# Patient Record
Sex: Female | Born: 1965
Health system: Southern US, Community
[De-identification: ages and names within clinical notes are randomized; demographics above are authoritative.]

## PROBLEM LIST (undated history)

## (undated) DIAGNOSIS — E538 Deficiency of other specified B group vitamins: Secondary | ICD-10-CM

## (undated) DIAGNOSIS — E039 Hypothyroidism, unspecified: Secondary | ICD-10-CM

## (undated) DIAGNOSIS — E119 Type 2 diabetes mellitus without complications: Secondary | ICD-10-CM

## (undated) DIAGNOSIS — E785 Hyperlipidemia, unspecified: Secondary | ICD-10-CM

## (undated) DIAGNOSIS — F419 Anxiety disorder, unspecified: Secondary | ICD-10-CM

## (undated) DIAGNOSIS — D649 Anemia, unspecified: Secondary | ICD-10-CM

## (undated) DIAGNOSIS — D510 Vitamin B12 deficiency anemia due to intrinsic factor deficiency: Principal | ICD-10-CM

## (undated) DIAGNOSIS — I10 Essential (primary) hypertension: Secondary | ICD-10-CM

## (undated) DIAGNOSIS — Z9289 Personal history of other medical treatment: Secondary | ICD-10-CM

## (undated) DIAGNOSIS — Z86010 Personal history of colonic polyps: Principal | ICD-10-CM

## (undated) DIAGNOSIS — D3910 Neoplasm of uncertain behavior of unspecified ovary: Principal | ICD-10-CM

## (undated) HISTORY — DX: Type 2 diabetes mellitus without complications: E11.9

## (undated) HISTORY — DX: Hyperlipidemia, unspecified: E78.5

## (undated) HISTORY — DX: Hypothyroidism, unspecified: E03.9

## (undated) HISTORY — DX: Personal history of other medical treatment: Z92.89

## (undated) HISTORY — DX: Vitamin B12 deficiency anemia due to intrinsic factor deficiency: D51.0

## (undated) HISTORY — DX: Anxiety disorder, unspecified: F41.9

## (undated) HISTORY — DX: Deficiency of other specified B group vitamins: E53.8

## (undated) HISTORY — DX: Personal history of colonic polyps: Z86.010

## (undated) HISTORY — DX: Neoplasm of uncertain behavior of unspecified ovary: D39.10

## (undated) HISTORY — DX: Anemia, unspecified: D64.9

## (undated) HISTORY — DX: Essential (primary) hypertension: I10

---

## 1999-08-14 DIAGNOSIS — Z9289 Personal history of other medical treatment: Secondary | ICD-10-CM

## 1999-08-14 HISTORY — DX: Personal history of other medical treatment: Z92.89

## 1999-12-25 ENCOUNTER — Encounter: Payer: Self-pay | Admitting: Emergency Medicine

## 1999-12-25 ENCOUNTER — Inpatient Hospital Stay (HOSPITAL_COMMUNITY): Admission: EM | Admit: 1999-12-25 | Discharge: 1999-12-29 | Payer: Self-pay | Admitting: Emergency Medicine

## 1999-12-25 ENCOUNTER — Encounter (INDEPENDENT_AMBULATORY_CARE_PROVIDER_SITE_OTHER): Payer: Self-pay

## 1999-12-28 ENCOUNTER — Encounter: Payer: Self-pay | Admitting: Internal Medicine

## 2004-06-03 ENCOUNTER — Ambulatory Visit: Payer: Self-pay | Admitting: Hematology & Oncology

## 2004-07-19 ENCOUNTER — Ambulatory Visit: Payer: Self-pay | Admitting: Hematology & Oncology

## 2004-09-12 ENCOUNTER — Ambulatory Visit: Payer: Self-pay | Admitting: Hematology & Oncology

## 2004-11-14 ENCOUNTER — Ambulatory Visit: Payer: Self-pay | Admitting: Hematology & Oncology

## 2005-01-16 ENCOUNTER — Ambulatory Visit: Payer: Self-pay | Admitting: Hematology & Oncology

## 2005-03-23 ENCOUNTER — Ambulatory Visit: Payer: Self-pay | Admitting: Hematology & Oncology

## 2005-05-24 ENCOUNTER — Ambulatory Visit: Payer: Self-pay | Admitting: Hematology & Oncology

## 2005-07-20 ENCOUNTER — Ambulatory Visit: Payer: Self-pay | Admitting: Hematology & Oncology

## 2005-09-28 ENCOUNTER — Ambulatory Visit: Payer: Self-pay | Admitting: Hematology & Oncology

## 2005-11-23 ENCOUNTER — Ambulatory Visit: Payer: Self-pay | Admitting: Hematology & Oncology

## 2005-11-26 LAB — CBC & DIFF AND RETIC
Basophils Absolute: 0 10*3/uL (ref 0.0–0.1)
Eosinophils Absolute: 0.1 10*3/uL (ref 0.0–0.5)
HGB: 12.8 g/dL (ref 11.6–15.9)
IRF: 0.28 (ref 0.130–0.330)
MCV: 67.3 fL — ABNORMAL LOW (ref 81.0–101.0)
MONO#: 0.4 10*3/uL (ref 0.1–0.9)
MONO%: 7 % (ref 0.0–13.0)
NEUT#: 4.3 10*3/uL (ref 1.5–6.5)
Platelets: 221 10*3/uL (ref 145–400)
RDW: 17 % — ABNORMAL HIGH (ref 11.3–14.5)
RETIC #: 75.1 10*3/uL (ref 19.7–115.1)
Retic %: 1.3 % (ref 0.4–2.3)
WBC: 6.2 10*3/uL (ref 3.9–10.0)

## 2005-11-26 LAB — FERRITIN: Ferritin: 6 ng/mL — ABNORMAL LOW (ref 10–291)

## 2005-12-31 LAB — CBC & DIFF AND RETIC
Basophils Absolute: 0 10*3/uL (ref 0.0–0.1)
EOS%: 1.6 % (ref 0.0–7.0)
Eosinophils Absolute: 0.1 10*3/uL (ref 0.0–0.5)
HCT: 36.3 % (ref 34.8–46.6)
HGB: 11.8 g/dL (ref 11.6–15.9)
IRF: 0.32 (ref 0.130–0.330)
MCH: 22 pg — ABNORMAL LOW (ref 26.0–34.0)
MCV: 68.1 fL — ABNORMAL LOW (ref 81.0–101.0)
MONO%: 9.1 % (ref 0.0–13.0)
NEUT%: 64.4 % (ref 39.6–76.8)

## 2006-01-24 ENCOUNTER — Ambulatory Visit: Payer: Self-pay | Admitting: Hematology & Oncology

## 2006-01-28 LAB — CBC & DIFF AND RETIC
BASO%: 0.7 % (ref 0.0–2.0)
EOS%: 1.2 % (ref 0.0–7.0)
HCT: 36.6 % (ref 34.8–46.6)
LYMPH%: 22.2 % (ref 14.0–48.0)
MCH: 21.8 pg — ABNORMAL LOW (ref 26.0–34.0)
MCHC: 32.3 g/dL (ref 32.0–36.0)
MCV: 67.4 fL — ABNORMAL LOW (ref 81.0–101.0)
MONO%: 9.5 % (ref 0.0–13.0)
NEUT%: 66.4 % (ref 39.6–76.8)
Platelets: 208 10*3/uL (ref 145–400)

## 2006-02-25 LAB — CBC WITH DIFFERENTIAL/PLATELET
Eosinophils Absolute: 0.1 10*3/uL (ref 0.0–0.5)
HCT: 36.7 % (ref 34.8–46.6)
LYMPH%: 20.2 % (ref 14.0–48.0)
MCHC: 32 g/dL (ref 32.0–36.0)
MCV: 67.9 fL — ABNORMAL LOW (ref 81.0–101.0)
MONO#: 0.5 10*3/uL (ref 0.1–0.9)
MONO%: 9.5 % (ref 0.0–13.0)
NEUT#: 3.7 10*3/uL (ref 1.5–6.5)
NEUT%: 68.5 % (ref 39.6–76.8)
Platelets: 182 10*3/uL (ref 145–400)
RBC: 5.41 10*6/uL — ABNORMAL HIGH (ref 3.70–5.32)
WBC: 5.4 10*3/uL (ref 3.9–10.0)

## 2006-03-21 ENCOUNTER — Ambulatory Visit: Payer: Self-pay | Admitting: Hematology & Oncology

## 2006-03-25 LAB — CBC WITH DIFFERENTIAL/PLATELET
Basophils Absolute: 0.1 10*3/uL (ref 0.0–0.1)
Eosinophils Absolute: 0 10*3/uL (ref 0.0–0.5)
HCT: 38.4 % (ref 34.8–46.6)
HGB: 12.4 g/dL (ref 11.6–15.9)
LYMPH%: 16.7 % (ref 14.0–48.0)
MONO#: 0.5 10*3/uL (ref 0.1–0.9)
NEUT#: 5.4 10*3/uL (ref 1.5–6.5)
NEUT%: 75.6 % (ref 39.6–76.8)
Platelets: 191 10*3/uL (ref 145–400)
WBC: 7.2 10*3/uL (ref 3.9–10.0)
lymph#: 1.2 10*3/uL (ref 0.9–3.3)

## 2006-04-26 LAB — CBC WITH DIFFERENTIAL/PLATELET
Basophils Absolute: 0 10*3/uL (ref 0.0–0.1)
Eosinophils Absolute: 0.1 10*3/uL (ref 0.0–0.5)
HGB: 12.3 g/dL (ref 11.6–15.9)
LYMPH%: 17.6 % (ref 14.0–48.0)
MCV: 68.1 fL — ABNORMAL LOW (ref 81.0–101.0)
MONO#: 0.5 10*3/uL (ref 0.1–0.9)
MONO%: 7.8 % (ref 0.0–13.0)
NEUT#: 4.3 10*3/uL (ref 1.5–6.5)
Platelets: 203 10*3/uL (ref 145–400)
RBC: 5.56 10*6/uL — ABNORMAL HIGH (ref 3.70–5.32)
WBC: 5.9 10*3/uL (ref 3.9–10.0)

## 2006-05-23 ENCOUNTER — Ambulatory Visit: Payer: Self-pay | Admitting: Hematology & Oncology

## 2006-07-01 LAB — CBC WITH DIFFERENTIAL/PLATELET
Basophils Absolute: 0 10*3/uL (ref 0.0–0.1)
EOS%: 0.7 % (ref 0.0–7.0)
HGB: 12.2 g/dL (ref 11.6–15.9)
MCH: 22.3 pg — ABNORMAL LOW (ref 26.0–34.0)
MCV: 68.8 fL — ABNORMAL LOW (ref 81.0–101.0)
MONO%: 7.5 % (ref 0.0–13.0)
NEUT#: 4.2 10*3/uL (ref 1.5–6.5)
RBC: 5.45 10*6/uL — ABNORMAL HIGH (ref 3.70–5.32)
RDW: 16.6 % — ABNORMAL HIGH (ref 11.3–14.5)
lymph#: 1.1 10*3/uL (ref 0.9–3.3)

## 2006-07-01 LAB — FERRITIN: Ferritin: 5 ng/mL — ABNORMAL LOW (ref 10–291)

## 2006-07-25 ENCOUNTER — Ambulatory Visit: Payer: Self-pay | Admitting: Hematology & Oncology

## 2006-07-29 LAB — CBC WITH DIFFERENTIAL/PLATELET
BASO%: 1.1 % (ref 0.0–2.0)
EOS%: 1.3 % (ref 0.0–7.0)
HCT: 39 % (ref 34.8–46.6)
LYMPH%: 18.6 % (ref 14.0–48.0)
MCH: 21.9 pg — ABNORMAL LOW (ref 26.0–34.0)
MCHC: 31.4 g/dL — ABNORMAL LOW (ref 32.0–36.0)
MONO#: 0.6 10*3/uL (ref 0.1–0.9)
NEUT%: 69.5 % (ref 39.6–76.8)
RBC: 5.6 10*6/uL — ABNORMAL HIGH (ref 3.70–5.32)
WBC: 6.4 10*3/uL (ref 3.9–10.0)
lymph#: 1.2 10*3/uL (ref 0.9–3.3)

## 2006-07-29 LAB — FERRITIN: Ferritin: 5 ng/mL — ABNORMAL LOW (ref 10–291)

## 2006-09-02 LAB — CBC WITH DIFFERENTIAL/PLATELET
BASO%: 0.6 % (ref 0.0–2.0)
EOS%: 1 % (ref 0.0–7.0)
MCH: 21.5 pg — ABNORMAL LOW (ref 26.0–34.0)
MCHC: 31.5 g/dL — ABNORMAL LOW (ref 32.0–36.0)
MCV: 68 fL — ABNORMAL LOW (ref 81.0–101.0)
MONO%: 11.9 % (ref 0.0–13.0)
RBC: 5.33 10*6/uL — ABNORMAL HIGH (ref 3.70–5.32)
RDW: 15.9 % — ABNORMAL HIGH (ref 11.3–14.5)

## 2006-09-25 ENCOUNTER — Ambulatory Visit: Payer: Self-pay | Admitting: Hematology & Oncology

## 2006-09-30 LAB — CBC WITH DIFFERENTIAL/PLATELET
Eosinophils Absolute: 0.1 10*3/uL (ref 0.0–0.5)
MCV: 66.8 fL — ABNORMAL LOW (ref 81.0–101.0)
MONO#: 0.5 10*3/uL (ref 0.1–0.9)
MONO%: 8.3 % (ref 0.0–13.0)
NEUT#: 4 10*3/uL (ref 1.5–6.5)
RBC: 5.49 10*6/uL — ABNORMAL HIGH (ref 3.70–5.32)
RDW: 16.4 % — ABNORMAL HIGH (ref 11.3–14.5)
WBC: 5.9 10*3/uL (ref 3.9–10.0)
lymph#: 1.3 10*3/uL (ref 0.9–3.3)

## 2006-10-28 LAB — CBC WITH DIFFERENTIAL/PLATELET
Eosinophils Absolute: 0.1 10*3/uL (ref 0.0–0.5)
LYMPH%: 23.7 % (ref 14.0–48.0)
MONO#: 0.6 10*3/uL (ref 0.1–0.9)
NEUT#: 3.9 10*3/uL (ref 1.5–6.5)
Platelets: 241 10*3/uL (ref 145–400)
RBC: 5.42 10*6/uL — ABNORMAL HIGH (ref 3.70–5.32)
WBC: 6.1 10*3/uL (ref 3.9–10.0)

## 2006-11-27 ENCOUNTER — Ambulatory Visit: Payer: Self-pay | Admitting: Hematology & Oncology

## 2006-12-02 LAB — CBC WITH DIFFERENTIAL/PLATELET
Basophils Absolute: 0.1 10*3/uL (ref 0.0–0.1)
Eosinophils Absolute: 0.2 10*3/uL (ref 0.0–0.5)
HCT: 35.5 % (ref 34.8–46.6)
LYMPH%: 17.5 % (ref 14.0–48.0)
MCHC: 32 g/dL (ref 32.0–36.0)
MONO#: 0.4 10*3/uL (ref 0.1–0.9)
NEUT#: 4.7 10*3/uL (ref 1.5–6.5)
NEUT%: 71.8 % (ref 39.6–76.8)
Platelets: 209 10*3/uL (ref 145–400)
WBC: 6.6 10*3/uL (ref 3.9–10.0)

## 2006-12-30 LAB — CBC WITH DIFFERENTIAL/PLATELET
BASO%: 0.8 % (ref 0.0–2.0)
EOS%: 1.8 % (ref 0.0–7.0)
HCT: 35 % (ref 34.8–46.6)
LYMPH%: 19.1 % (ref 14.0–48.0)
MCH: 21.8 pg — ABNORMAL LOW (ref 26.0–34.0)
MCHC: 32.8 g/dL (ref 32.0–36.0)
NEUT%: 69.9 % (ref 39.6–76.8)
Platelets: 191 10*3/uL (ref 145–400)

## 2007-01-23 ENCOUNTER — Ambulatory Visit: Payer: Self-pay | Admitting: Hematology & Oncology

## 2007-01-27 LAB — CBC WITH DIFFERENTIAL/PLATELET
Basophils Absolute: 0 10*3/uL (ref 0.0–0.1)
EOS%: 1.1 % (ref 0.0–7.0)
Eosinophils Absolute: 0.1 10*3/uL (ref 0.0–0.5)
HCT: 34.6 % — ABNORMAL LOW (ref 34.8–46.6)
HGB: 11.2 g/dL — ABNORMAL LOW (ref 11.6–15.9)
MCH: 21.6 pg — ABNORMAL LOW (ref 26.0–34.0)
MONO#: 0.6 10*3/uL (ref 0.1–0.9)
NEUT#: 5.1 10*3/uL (ref 1.5–6.5)
RDW: 17.2 % — ABNORMAL HIGH (ref 11.3–14.5)
WBC: 6.9 10*3/uL (ref 3.9–10.0)
lymph#: 1.1 10*3/uL (ref 0.9–3.3)

## 2007-03-03 LAB — CBC WITH DIFFERENTIAL/PLATELET
Basophils Absolute: 0 10*3/uL (ref 0.0–0.1)
Eosinophils Absolute: 0.1 10*3/uL (ref 0.0–0.5)
HCT: 33.9 % — ABNORMAL LOW (ref 34.8–46.6)
HGB: 11.1 g/dL — ABNORMAL LOW (ref 11.6–15.9)
MCV: 66.2 fL — ABNORMAL LOW (ref 81.0–101.0)
MONO%: 9.7 % (ref 0.0–13.0)
NEUT#: 3.7 10*3/uL (ref 1.5–6.5)
NEUT%: 66.7 % (ref 39.6–76.8)
RDW: 17.1 % — ABNORMAL HIGH (ref 11.3–14.5)

## 2007-03-27 ENCOUNTER — Ambulatory Visit: Payer: Self-pay | Admitting: Hematology & Oncology

## 2007-03-31 LAB — CBC WITH DIFFERENTIAL/PLATELET
Basophils Absolute: 0.1 10*3/uL (ref 0.0–0.1)
Eosinophils Absolute: 0 10*3/uL (ref 0.0–0.5)
HGB: 11.6 g/dL (ref 11.6–15.9)
LYMPH%: 18.4 % (ref 14.0–48.0)
MCV: 67.5 fL — ABNORMAL LOW (ref 81.0–101.0)
MONO%: 7.7 % (ref 0.0–13.0)
NEUT#: 4.6 10*3/uL (ref 1.5–6.5)
Platelets: 186 10*3/uL (ref 145–400)

## 2007-05-05 LAB — CBC WITH DIFFERENTIAL/PLATELET
BASO%: 0.3 % (ref 0.0–2.0)
EOS%: 1.8 % (ref 0.0–7.0)
HCT: 35.4 % (ref 34.8–46.6)
LYMPH%: 20.9 % (ref 14.0–48.0)
MCH: 22.3 pg — ABNORMAL LOW (ref 26.0–34.0)
MCHC: 32.9 g/dL (ref 32.0–36.0)
MCV: 67.9 fL — ABNORMAL LOW (ref 81.0–101.0)
NEUT%: 67.1 % (ref 39.6–76.8)
Platelets: 177 10*3/uL (ref 145–400)

## 2007-06-05 ENCOUNTER — Ambulatory Visit: Payer: Self-pay | Admitting: Hematology & Oncology

## 2007-06-09 LAB — CBC WITH DIFFERENTIAL/PLATELET
Basophils Absolute: 0 10*3/uL (ref 0.0–0.1)
Eosinophils Absolute: 0 10*3/uL (ref 0.0–0.5)
HCT: 28.9 % — ABNORMAL LOW (ref 34.8–46.6)
HGB: 9.4 g/dL — ABNORMAL LOW (ref 11.6–15.9)
LYMPH%: 18.3 % (ref 14.0–48.0)
MONO#: 0.6 10*3/uL (ref 0.1–0.9)
NEUT%: 70.8 % (ref 39.6–76.8)
Platelets: 335 10*3/uL (ref 145–400)
WBC: 5.6 10*3/uL (ref 3.9–10.0)
lymph#: 1 10*3/uL (ref 0.9–3.3)

## 2007-07-08 LAB — CBC WITH DIFFERENTIAL/PLATELET
Basophils Absolute: 0.1 10*3/uL (ref 0.0–0.1)
EOS%: 0.9 % (ref 0.0–7.0)
HCT: 30.3 % — ABNORMAL LOW (ref 34.8–46.6)
HGB: 9.7 g/dL — ABNORMAL LOW (ref 11.6–15.9)
LYMPH%: 20.7 % (ref 14.0–48.0)
MCH: 20.7 pg — ABNORMAL LOW (ref 26.0–34.0)
MCHC: 32 g/dL (ref 32.0–36.0)
MCV: 64.7 fL — ABNORMAL LOW (ref 81.0–101.0)
NEUT%: 68.9 % (ref 39.6–76.8)
Platelets: 235 10*3/uL (ref 145–400)
lymph#: 1.1 10*3/uL (ref 0.9–3.3)

## 2007-08-13 ENCOUNTER — Ambulatory Visit: Payer: Self-pay | Admitting: Hematology & Oncology

## 2007-08-14 HISTORY — PX: TOTAL ABDOMINAL HYSTERECTOMY W/ BILATERAL SALPINGOOPHORECTOMY: SHX83

## 2007-08-14 HISTORY — PX: ABDOMINAL HYSTERECTOMY: SHX81

## 2007-08-14 HISTORY — PX: LAPAROSCOPIC TOTAL HYSTERECTOMY: SUR800

## 2007-08-18 LAB — CBC WITH DIFFERENTIAL/PLATELET
BASO%: 1.2 % (ref 0.0–2.0)
Basophils Absolute: 0.1 10*3/uL (ref 0.0–0.1)
EOS%: 1.8 % (ref 0.0–7.0)
HGB: 9.9 g/dL — ABNORMAL LOW (ref 11.6–15.9)
MCH: 19.8 pg — ABNORMAL LOW (ref 26.0–34.0)
MCHC: 29.6 g/dL — ABNORMAL LOW (ref 32.0–36.0)
MCV: 66.9 fL — ABNORMAL LOW (ref 81.0–101.0)
MONO%: 10.1 % (ref 0.0–13.0)
NEUT%: 68.6 % (ref 39.6–76.8)
RDW: 12.9 % (ref 11.3–14.5)

## 2007-09-22 LAB — CBC WITH DIFFERENTIAL/PLATELET
BASO%: 0 % (ref 0.0–2.0)
EOS%: 1.2 % (ref 0.0–7.0)
HCT: 31 % — ABNORMAL LOW (ref 34.8–46.6)
LYMPH%: 19 % (ref 14.0–48.0)
MCH: 20 pg — ABNORMAL LOW (ref 26.0–34.0)
MCHC: 31.6 g/dL — ABNORMAL LOW (ref 32.0–36.0)
MCV: 63.2 fL — ABNORMAL LOW (ref 81.0–101.0)
MONO%: 19.9 % — ABNORMAL HIGH (ref 0.0–13.0)
NEUT%: 59.9 % (ref 39.6–76.8)
Platelets: 191 10*3/uL (ref 145–400)
RBC: 4.91 10*6/uL (ref 3.70–5.32)
WBC: 3.1 10*3/uL — ABNORMAL LOW (ref 3.9–10.0)

## 2007-10-23 ENCOUNTER — Ambulatory Visit: Payer: Self-pay | Admitting: Hematology & Oncology

## 2007-10-28 LAB — CBC WITH DIFFERENTIAL/PLATELET
BASO%: 0.2 % (ref 0.0–2.0)
Basophils Absolute: 0 10*3/uL (ref 0.0–0.1)
EOS%: 1.3 % (ref 0.0–7.0)
HGB: 8.9 g/dL — ABNORMAL LOW (ref 11.6–15.9)
MCH: 19.7 pg — ABNORMAL LOW (ref 26.0–34.0)
MONO#: 0.6 10*3/uL (ref 0.1–0.9)
RDW: 19 % — ABNORMAL HIGH (ref 11.3–14.5)
WBC: 6.1 10*3/uL (ref 3.9–10.0)
lymph#: 1.1 10*3/uL (ref 0.9–3.3)

## 2007-12-01 LAB — CBC WITH DIFFERENTIAL/PLATELET
BASO%: 0.4 % (ref 0.0–2.0)
EOS%: 2.9 % (ref 0.0–7.0)
MCH: 21 pg — ABNORMAL LOW (ref 26.0–34.0)
MCHC: 31.6 g/dL — ABNORMAL LOW (ref 32.0–36.0)
MONO#: 0.4 10*3/uL (ref 0.1–0.9)
NEUT%: 72.6 % (ref 39.6–76.8)
RBC: 4.77 10*6/uL (ref 3.70–5.32)
RDW: 23.3 % — ABNORMAL HIGH (ref 11.3–14.5)
WBC: 5.2 10*3/uL (ref 3.9–10.0)
lymph#: 0.9 10*3/uL (ref 0.9–3.3)

## 2007-12-03 ENCOUNTER — Ambulatory Visit: Admission: RE | Admit: 2007-12-03 | Discharge: 2007-12-03 | Payer: Self-pay | Admitting: Gynecologic Oncology

## 2007-12-29 ENCOUNTER — Ambulatory Visit: Payer: Self-pay | Admitting: Hematology & Oncology

## 2008-02-09 LAB — CBC WITH DIFFERENTIAL/PLATELET
BASO%: 0.5 % (ref 0.0–2.0)
EOS%: 1.8 % (ref 0.0–7.0)
HCT: 39.7 % (ref 34.8–46.6)
LYMPH%: 23.9 % (ref 14.0–48.0)
MCH: 25.4 pg — ABNORMAL LOW (ref 26.0–34.0)
MCHC: 34.2 g/dL (ref 32.0–36.0)
MONO%: 8.4 % (ref 0.0–13.0)
NEUT%: 65.4 % (ref 39.6–76.8)
Platelets: 153 10*3/uL (ref 145–400)
RBC: 5.33 10*6/uL — ABNORMAL HIGH (ref 3.70–5.32)

## 2008-03-07 ENCOUNTER — Ambulatory Visit: Payer: Self-pay | Admitting: Hematology & Oncology

## 2008-03-15 LAB — CBC WITH DIFFERENTIAL/PLATELET
Basophils Absolute: 0 10*3/uL (ref 0.0–0.1)
EOS%: 1.9 % (ref 0.0–7.0)
Eosinophils Absolute: 0.1 10*3/uL (ref 0.0–0.5)
HGB: 13.4 g/dL (ref 11.6–15.9)
LYMPH%: 28.9 % (ref 14.0–48.0)
MCH: 26.8 pg (ref 26.0–34.0)
MCV: 78.5 fL — ABNORMAL LOW (ref 81.0–101.0)
MONO%: 8.5 % (ref 0.0–13.0)
NEUT#: 2.6 10*3/uL (ref 1.5–6.5)
Platelets: 143 10*3/uL — ABNORMAL LOW (ref 145–400)
RBC: 4.99 10*6/uL (ref 3.70–5.32)
RDW: 18.1 % — ABNORMAL HIGH (ref 11.3–14.5)

## 2008-03-25 ENCOUNTER — Ambulatory Visit: Payer: Self-pay | Admitting: Hematology & Oncology

## 2008-03-29 LAB — CBC WITH DIFFERENTIAL (CANCER CENTER ONLY)
Eosinophils Absolute: 0.1 10*3/uL (ref 0.0–0.5)
LYMPH#: 1.3 10*3/uL (ref 0.9–3.3)
MONO#: 0.4 10*3/uL (ref 0.1–0.9)
NEUT#: 2.7 10*3/uL (ref 1.5–6.5)
Platelets: 136 10*3/uL — ABNORMAL LOW (ref 145–400)
RBC: 5.32 10*6/uL (ref 3.70–5.32)
WBC: 4.5 10*3/uL (ref 3.9–10.0)

## 2008-04-22 ENCOUNTER — Ambulatory Visit: Payer: Self-pay | Admitting: Hematology & Oncology

## 2008-04-26 LAB — CBC WITH DIFFERENTIAL/PLATELET
Basophils Absolute: 0 10*3/uL (ref 0.0–0.1)
EOS%: 1.6 % (ref 0.0–7.0)
Eosinophils Absolute: 0.1 10*3/uL (ref 0.0–0.5)
HCT: 38.1 % (ref 34.8–46.6)
HGB: 13.3 g/dL (ref 11.6–15.9)
MCH: 28.5 pg (ref 26.0–34.0)
MCV: 81.9 fL (ref 81.0–101.0)
MONO%: 10.1 % (ref 0.0–13.0)
NEUT#: 3 10*3/uL (ref 1.5–6.5)
NEUT%: 61.6 % (ref 39.6–76.8)
RDW: 16 % — ABNORMAL HIGH (ref 11.3–14.5)

## 2008-05-14 LAB — CA 125: CA 125: 11.9 U/mL (ref 0.0–30.2)

## 2008-05-18 ENCOUNTER — Ambulatory Visit: Admission: RE | Admit: 2008-05-18 | Discharge: 2008-05-18 | Payer: Self-pay | Admitting: Gynecologic Oncology

## 2008-05-24 LAB — CBC WITH DIFFERENTIAL/PLATELET
BASO%: 0.5 % (ref 0.0–2.0)
Eosinophils Absolute: 0.1 10*3/uL (ref 0.0–0.5)
HCT: 38.4 % (ref 34.8–46.6)
HGB: 13.4 g/dL (ref 11.6–15.9)
MCHC: 35 g/dL (ref 32.0–36.0)
MONO#: 0.4 10*3/uL (ref 0.1–0.9)
NEUT#: 3 10*3/uL (ref 1.5–6.5)
NEUT%: 64.4 % (ref 39.6–76.8)
WBC: 4.7 10*3/uL (ref 3.9–10.0)
lymph#: 1.2 10*3/uL (ref 0.9–3.3)

## 2008-06-24 ENCOUNTER — Ambulatory Visit: Payer: Self-pay | Admitting: Hematology & Oncology

## 2008-06-28 LAB — CBC WITH DIFFERENTIAL/PLATELET
Basophils Absolute: 0.1 10*3/uL (ref 0.0–0.1)
Eosinophils Absolute: 0.1 10*3/uL (ref 0.0–0.5)
HGB: 13.1 g/dL (ref 11.6–15.9)
MONO#: 0.4 10*3/uL (ref 0.1–0.9)
MONO%: 8.9 % (ref 0.0–13.0)
NEUT#: 2.7 10*3/uL (ref 1.5–6.5)
RBC: 4.47 10*6/uL (ref 3.70–5.32)
RDW: 12.6 % (ref 11.3–14.5)
WBC: 4.3 10*3/uL (ref 3.9–10.0)
lymph#: 1.1 10*3/uL (ref 0.9–3.3)

## 2008-07-26 LAB — CBC WITH DIFFERENTIAL/PLATELET
BASO%: 1.1 % (ref 0.0–2.0)
EOS%: 1.6 % (ref 0.0–7.0)
HCT: 38.8 % (ref 34.8–46.6)
MCH: 29.3 pg (ref 26.0–34.0)
MCHC: 35.2 g/dL (ref 32.0–36.0)
MONO#: 0.4 10*3/uL (ref 0.1–0.9)
RDW: 12.1 % (ref 11.3–14.5)
WBC: 5.1 10*3/uL (ref 3.9–10.0)
lymph#: 1.2 10*3/uL (ref 0.9–3.3)

## 2008-07-26 LAB — CHCC SMEAR

## 2008-08-19 ENCOUNTER — Ambulatory Visit: Payer: Self-pay | Admitting: Hematology & Oncology

## 2008-08-23 LAB — CBC WITH DIFFERENTIAL/PLATELET
BASO%: 0.4 % (ref 0.0–2.0)
EOS%: 1.4 % (ref 0.0–7.0)
HCT: 40.3 % (ref 34.8–46.6)
LYMPH%: 27.4 % (ref 14.0–48.0)
MCH: 29.8 pg (ref 26.0–34.0)
MCHC: 34.8 g/dL (ref 32.0–36.0)
MONO#: 0.4 10*3/uL (ref 0.1–0.9)
NEUT%: 62.1 % (ref 39.6–76.8)
RBC: 4.7 10*6/uL (ref 3.70–5.32)
WBC: 4.9 10*3/uL (ref 3.9–10.0)
lymph#: 1.3 10*3/uL (ref 0.9–3.3)

## 2008-09-20 LAB — CBC WITH DIFFERENTIAL/PLATELET
BASO%: 0.4 % (ref 0.0–2.0)
HCT: 41.8 % (ref 34.8–46.6)
LYMPH%: 26.9 % (ref 14.0–48.0)
MCH: 29.8 pg (ref 26.0–34.0)
MCHC: 35.3 g/dL (ref 32.0–36.0)
MCV: 84.5 fL (ref 81.0–101.0)
MONO%: 7.5 % (ref 0.0–13.0)
NEUT%: 63.7 % (ref 39.6–76.8)
Platelets: 159 10*3/uL (ref 145–400)
RBC: 4.94 10*6/uL (ref 3.70–5.32)

## 2008-10-14 ENCOUNTER — Ambulatory Visit: Payer: Self-pay | Admitting: Hematology & Oncology

## 2008-10-18 LAB — CBC WITH DIFFERENTIAL/PLATELET
Basophils Absolute: 0 10*3/uL (ref 0.0–0.1)
Eosinophils Absolute: 0 10*3/uL (ref 0.0–0.5)
HCT: 42.4 % (ref 34.8–46.6)
HGB: 14.5 g/dL (ref 11.6–15.9)
LYMPH%: 16.3 % (ref 14.0–49.7)
MCV: 85.4 fL (ref 79.5–101.0)
MONO#: 0.4 10*3/uL (ref 0.1–0.9)
MONO%: 4.9 % (ref 0.0–14.0)
NEUT#: 5.9 10*3/uL (ref 1.5–6.5)
NEUT%: 78.2 % — ABNORMAL HIGH (ref 38.4–76.8)
Platelets: 175 10*3/uL (ref 145–400)
RBC: 4.97 10*6/uL (ref 3.70–5.45)
WBC: 7.6 10*3/uL (ref 3.9–10.3)

## 2008-11-15 LAB — CBC WITH DIFFERENTIAL/PLATELET
Basophils Absolute: 0 10*3/uL (ref 0.0–0.1)
Eosinophils Absolute: 0 10*3/uL (ref 0.0–0.5)
HGB: 13.7 g/dL (ref 11.6–15.9)
MCV: 86 fL (ref 79.5–101.0)
MONO#: 0.4 10*3/uL (ref 0.1–0.9)
MONO%: 6.1 % (ref 0.0–14.0)
NEUT#: 4.3 10*3/uL (ref 1.5–6.5)
RDW: 13.9 % (ref 11.2–14.5)
WBC: 5.8 10*3/uL (ref 3.9–10.3)

## 2008-12-08 ENCOUNTER — Ambulatory Visit: Admission: RE | Admit: 2008-12-08 | Discharge: 2008-12-08 | Payer: Self-pay | Admitting: Gynecologic Oncology

## 2008-12-09 ENCOUNTER — Ambulatory Visit: Payer: Self-pay | Admitting: Hematology

## 2009-01-11 LAB — CBC WITH DIFFERENTIAL/PLATELET
Basophils Absolute: 0 10*3/uL (ref 0.0–0.1)
EOS%: 0.8 % (ref 0.0–7.0)
Eosinophils Absolute: 0 10*3/uL (ref 0.0–0.5)
HGB: 13.8 g/dL (ref 11.6–15.9)
NEUT#: 3.4 10*3/uL (ref 1.5–6.5)
RBC: 4.64 10*6/uL (ref 3.70–5.45)
RDW: 13.5 % (ref 11.2–14.5)
lymph#: 1.3 10*3/uL (ref 0.9–3.3)

## 2009-02-08 ENCOUNTER — Ambulatory Visit: Payer: Self-pay | Admitting: Hematology

## 2009-02-10 LAB — CBC WITH DIFFERENTIAL/PLATELET
Eosinophils Absolute: 0 10*3/uL (ref 0.0–0.5)
LYMPH%: 16.8 % (ref 14.0–49.7)
MONO#: 0.4 10*3/uL (ref 0.1–0.9)
NEUT#: 3.5 10*3/uL (ref 1.5–6.5)
Platelets: 148 10*3/uL (ref 145–400)
RBC: 4.64 10*6/uL (ref 3.70–5.45)
RDW: 13.5 % (ref 11.2–14.5)
WBC: 4.8 10*3/uL (ref 3.9–10.3)

## 2009-03-07 LAB — CHCC SMEAR

## 2009-03-07 LAB — CBC & DIFF AND RETIC
Basophils Absolute: 0 10*3/uL (ref 0.0–0.1)
Eosinophils Absolute: 0.1 10*3/uL (ref 0.0–0.5)
HCT: 39.7 % (ref 34.8–46.6)
HGB: 14 g/dL (ref 11.6–15.9)
Immature Retic Fract: 2.4 % (ref 0.00–10.70)
MONO#: 0.4 10*3/uL (ref 0.1–0.9)
NEUT#: 3.5 10*3/uL (ref 1.5–6.5)
NEUT%: 63.9 % (ref 38.4–76.8)
WBC: 5.5 10*3/uL (ref 3.9–10.3)
lymph#: 1.5 10*3/uL (ref 0.9–3.3)

## 2009-03-07 LAB — FERRITIN: Ferritin: 108 ng/mL (ref 10–291)

## 2009-03-25 ENCOUNTER — Ambulatory Visit: Payer: Self-pay | Admitting: Hematology & Oncology

## 2009-03-28 LAB — CBC WITH DIFFERENTIAL (CANCER CENTER ONLY)
EOS%: 1.9 % (ref 0.0–7.0)
MCH: 28.9 pg (ref 26.0–34.0)
MCHC: 33.8 g/dL (ref 32.0–36.0)
MONO%: 5.2 % (ref 0.0–13.0)
NEUT#: 3.4 10*3/uL (ref 1.5–6.5)
Platelets: 169 10*3/uL (ref 145–400)
RBC: 4.82 10*6/uL (ref 3.70–5.32)

## 2009-03-28 LAB — RETICULOCYTES (CHCC): RBC.: 4.88 MIL/uL (ref 3.87–5.11)

## 2009-04-28 ENCOUNTER — Ambulatory Visit: Payer: Self-pay | Admitting: Hematology

## 2009-05-25 ENCOUNTER — Ambulatory Visit: Payer: Self-pay | Admitting: Hematology

## 2009-06-23 ENCOUNTER — Ambulatory Visit: Payer: Self-pay | Admitting: Hematology

## 2009-06-29 ENCOUNTER — Ambulatory Visit: Admission: RE | Admit: 2009-06-29 | Discharge: 2009-06-29 | Payer: Self-pay | Admitting: Gynecologic Oncology

## 2009-07-25 ENCOUNTER — Ambulatory Visit: Payer: Self-pay | Admitting: Hematology

## 2009-08-18 ENCOUNTER — Ambulatory Visit: Payer: Self-pay | Admitting: Hematology

## 2009-09-19 ENCOUNTER — Ambulatory Visit: Payer: Self-pay | Admitting: Hematology

## 2009-11-14 ENCOUNTER — Ambulatory Visit: Payer: Self-pay | Admitting: Hematology & Oncology

## 2009-12-09 ENCOUNTER — Ambulatory Visit: Payer: Self-pay | Admitting: Oncology

## 2009-12-21 ENCOUNTER — Ambulatory Visit: Admission: RE | Admit: 2009-12-21 | Discharge: 2009-12-21 | Payer: Self-pay | Admitting: Gynecologic Oncology

## 2010-01-10 ENCOUNTER — Ambulatory Visit: Payer: Self-pay | Admitting: Oncology

## 2010-03-02 ENCOUNTER — Ambulatory Visit: Payer: Self-pay | Admitting: Oncology

## 2010-03-31 ENCOUNTER — Ambulatory Visit: Payer: Self-pay | Admitting: Hematology & Oncology

## 2010-04-03 LAB — CBC WITH DIFFERENTIAL (CANCER CENTER ONLY)
BASO#: 0 10*3/uL (ref 0.0–0.2)
BASO%: 0.6 % (ref 0.0–2.0)
EOS%: 1.5 % (ref 0.0–7.0)
Eosinophils Absolute: 0.1 10*3/uL (ref 0.0–0.5)
HCT: 42.4 % (ref 34.8–46.6)
HGB: 14.4 g/dL (ref 11.6–15.9)
LYMPH#: 1.4 10*3/uL (ref 0.9–3.3)
LYMPH%: 26.2 % (ref 14.0–48.0)
MCH: 29.3 pg (ref 26.0–34.0)
MCHC: 34 g/dL (ref 32.0–36.0)
MCV: 86 fL (ref 81–101)
MONO#: 0.3 10*3/uL (ref 0.1–0.9)
MONO%: 5.6 % (ref 0.0–13.0)
NEUT#: 3.6 10*3/uL (ref 1.5–6.5)
NEUT%: 66.1 % (ref 39.6–80.0)
Platelets: 161 10*3/uL (ref 145–400)
RBC: 4.92 10*6/uL (ref 3.70–5.32)
RDW: 12.5 % (ref 10.5–14.6)
WBC: 5.5 10*3/uL (ref 3.9–10.0)

## 2010-04-03 LAB — FERRITIN: Ferritin: 186 ng/mL (ref 10–291)

## 2010-04-03 LAB — VITAMIN D 25 HYDROXY (VIT D DEFICIENCY, FRACTURES): Vit D, 25-Hydroxy: 48 ng/mL (ref 30–89)

## 2010-04-03 LAB — CHCC SATELLITE - SMEAR

## 2010-04-26 ENCOUNTER — Ambulatory Visit: Payer: Self-pay | Admitting: Oncology

## 2010-05-26 ENCOUNTER — Ambulatory Visit: Payer: Self-pay | Admitting: Oncology

## 2010-06-26 ENCOUNTER — Ambulatory Visit: Payer: Self-pay | Admitting: Oncology

## 2010-06-28 ENCOUNTER — Ambulatory Visit
Admission: RE | Admit: 2010-06-28 | Discharge: 2010-06-28 | Payer: Self-pay | Source: Home / Self Care | Admitting: Gynecologic Oncology

## 2010-08-17 ENCOUNTER — Ambulatory Visit: Payer: Self-pay | Admitting: Hematology & Oncology

## 2010-09-18 ENCOUNTER — Encounter (HOSPITAL_BASED_OUTPATIENT_CLINIC_OR_DEPARTMENT_OTHER): Payer: BC Managed Care – PPO | Admitting: Oncology

## 2010-09-18 DIAGNOSIS — D510 Vitamin B12 deficiency anemia due to intrinsic factor deficiency: Secondary | ICD-10-CM

## 2010-10-16 ENCOUNTER — Encounter (HOSPITAL_BASED_OUTPATIENT_CLINIC_OR_DEPARTMENT_OTHER): Payer: BC Managed Care – PPO | Admitting: Oncology

## 2010-10-16 DIAGNOSIS — D510 Vitamin B12 deficiency anemia due to intrinsic factor deficiency: Secondary | ICD-10-CM

## 2010-10-24 LAB — CA 125: CA 125: 16.5 U/mL (ref 0.0–30.2)

## 2010-10-31 LAB — CA 125: CA 125: 18.6 U/mL (ref 0.0–30.2)

## 2010-11-13 ENCOUNTER — Encounter (HOSPITAL_BASED_OUTPATIENT_CLINIC_OR_DEPARTMENT_OTHER): Payer: BC Managed Care – PPO | Admitting: Oncology

## 2010-11-13 DIAGNOSIS — D510 Vitamin B12 deficiency anemia due to intrinsic factor deficiency: Secondary | ICD-10-CM

## 2010-12-11 ENCOUNTER — Encounter (HOSPITAL_BASED_OUTPATIENT_CLINIC_OR_DEPARTMENT_OTHER): Payer: BC Managed Care – PPO | Admitting: Oncology

## 2010-12-11 DIAGNOSIS — D510 Vitamin B12 deficiency anemia due to intrinsic factor deficiency: Secondary | ICD-10-CM

## 2010-12-26 NOTE — Consult Note (Signed)
[name], [name]           ACCOUNT NO.:  [id number]   MEDICAL RECORD NO.:  [id number]          PATIENT TYPE:  OUT   LOCATION:  GYN                          FACILITY:  [facility]   PHYSICIAN:  Paola A. Duard Brady, MD    DATE OF BIRTH:  04/24/1966   DATE OF CONSULTATION:  05/18/2008  DATE OF DISCHARGE:                                 CONSULTATION   Odaly is a very pleasant 45 year old who had a large abdominal pelvic  mass.  She was seen initially March of 2009, at that time she had a 30-  40 cm abdominal pelvic mass and she underwent exploratory laparotomy,  TAH-BSO October 30, 2007.  Operative findings included a 40 cm abdominal  pelvic mass arising from the right ovary with 21 liters of fluid.  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  She was last seen by me for postoperative check on December 03, 2007 and  comes in today for her first interval evaluation.  She is overall doing  quite well and really denies any complaints.   REVIEW OF SYSTEMS:  She denies any chest pain, short of breath, nausea,  vomiting, fevers, chills, headaches, visual changes.  She denies any  change in her bowel or bladder habits, any headaches, unintentional  weight loss, weight gain or early satiety.  She has recently been seen  by Dr. Myna Hidalgo for the follow-up of her pernicious anemia.   MEDICATIONS:  Prenatal vitamins, iron, clonazepam and Norvasc.   HEALTH MAINTENANCE:  She is up-to-date on her mammograms.   PHYSICAL EXAMINATION:  Well-nourished, well-developed female in no acute  distress.  NECK:  Supple, there is no lymphadenopathy, no thyromegaly.  LUNGS:  Clear to auscultation bilaterally.  CARDIOVASCULAR:  Regular rate and rhythm.  ABDOMEN:  She has a well-healed vertical midline incision.  Abdomen is  soft, nontender, nondistended.  There are no palpable masses or  hepatosplenomegaly.  GROINS:  Negative for adenopathy.  EXTREMITIES:  She has 2+ nonpitting edema equal bilaterally.  PELVIC:  External genitalia is within normal limits.  Bimanual  examination is overall poorly tolerated by the patient, however, there  is no masses or nodularity.  RECTAL:  Confirmed.   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  CA-125 is a marker for her, at the  time of her surgery it was elevated at 194.  She had a CA-125 on October  2 that was 11.9.   PLAN:  Return to see [place] in 6 months with a CA-125 at that time.  She  will continue follow-up with Dr. Myna Hidalgo as scheduled.  She was given  precautions and indications when to contact us.      Paola A. Duard Brady, MD  Electronically Signed     PAG/MEDQ  D:  05/18/2008  T:  05/18/2008  Job:  [id number]   cc:   Telford Nab, R.N.  [address]  [place], [place] [address]   Rose Phi. Myna Hidalgo, M.D.  Fax: [phone]   Marcos Eke. Hal Hope, M.D.  Fax: [phone]

## 2010-12-26 NOTE — Consult Note (Signed)
[name], [name]           ACCOUNT NO.:  [id number]   MEDICAL RECORD NO.:  [id number]          PATIENT TYPE:  OUT   LOCATION:  GYN                          FACILITY:  [facility]   PHYSICIAN:  Paola A. Duard Brady, MD    DATE OF BIRTH:  09-03-1965   DATE OF CONSULTATION:  DATE OF DISCHARGE:                                 CONSULTATION   HISTORY OF PRESENT ILLNESS:  [name] is a very pleasant 45 year old with  a large abdominal pelvic mass that she has noticed since November.  We  initially saw her in [place] March 2009.  At that time, imaging  showed a 30-40 cm abdominal pelvic mass.  She subsequently underwent  exploratory laparotomy TAH-BSO at [facility] on October 30, 2007.  Operative findings included a 40 cm abdominal pelvic mass, arising from  the right ovary.  There were 21 liters of fluid drained from it.  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  The appendix was negative.  The omentum was  negative.  The ovary itself measured 30 x 25 x 8 cm.  There was no  surface involvement.  She comes in today for her postoperative check.  She is overall doing quite well and really denies any complaints.  She  states she feels like she is a 100%.  She denies any bleeding.  She  denies any hot flashes.   PHYSICAL EXAMINATION:  VITAL SIGNS:  Weight 187 pounds, height 5 feet 8-  1/2 inches, well-nourished, well-developed female in no acute distress.  ABDOMEN:  Shows well-healed vertical skin incision.  Abdomen is soft and  nontender.  PELVIC:  External genitalia is within normal limits.  The vaginal cuff  is visualized.  It is healing well.  Bimanual examination reveals no  cuff masses or tenderness.   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  She is doing well from  postoperative standpoint.  We released her to usual activities.  She  will return to see me in 6 months.  Of  note, her CA-125 is a tumor  marker for her as it was 194 at the time of diagnosis.      Paola A. Duard Brady, MD  Electronically Signed     PAG/MEDQ  D:  12/03/2007  T:  12/03/2007  Job:  [id number]   cc:   Clydie Braun L. Hal Hope, M.D.  Fax: [phone]   Rose Phi. Myna Hidalgo, M.D.  Fax: [phone]   Telford Nab, R.N.  [address]  [place], [place] [address]

## 2010-12-26 NOTE — Consult Note (Signed)
NAMESHARANDA, [name]           ACCOUNT NO.:  [id number]   MEDICAL RECORD NO.:  [id number]          PATIENT TYPE:  OUT   LOCATION:  GYN                          FACILITY:  [facility]   PHYSICIAN:  Paola A. Duard Brady, MD    DATE OF BIRTH:  [birth year]   DATE OF CONSULTATION:  12/08/2008  DATE OF DISCHARGE:                                 CONSULTATION   Ms. [name] is a very pleasant 45 year old who had a large abdominal  pelvic mass.  In March 2009 she underwent origin laparotomy TAH-BSO.  Operative findings included a 40 cm abdominal pelvic mass arising from  the right ovary with 21 liters of fluid.  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  Her appendix was negative as was the omentum.  She was dispositioned to  close followup.  CA-125 at the time for surgery was elevated at 194 and  is a tumor marker for her.  I last saw her in October 2009 at which time  her exam was unremarkable with her CA-125 was normal at 11.9.  She comes  in today for followup.  She has overall been doing fairly well.  She had  been having some significant issues of the last 6 months with abnormal  thyroid functions that she and her doctor are working with.  She has  also stopped exercising and she states that this accounts for the near  60-pound weight gain she has had since this time last year.  She  otherwise denies any complaints.  She has any abdominal pain, nausea,  vomiting, fevers, chills, headaches, visual changes, change in bowel or  bladder habits.   MEDICATIONS:  Include thyroid medication, lisinopril b.i.d.,  multivitamin, calcium, vitamin D.   HEALTH MAINTENANCE:  She is up-to-date on her mammograms.   PHYSICAL EXAMINATION:  VITAL SIGNS:  Weight 241 pounds.  CONSTITUTIONAL:  Well-nourished, well-developed female in no acute  distress.  NECK:  Supple with no lymphadenopathy, no thyromegaly.  LUNGS:  Clear to auscultation bilaterally.  CARDIOVASCULAR:  Came regular rate and rhythm.  ABDOMEN:  Morbidly obese.  She has a well-healed vertical midline  incision.  Abdomen is soft, nontender, nondistended.  There are no  palpable masses or hepatosplenomegaly, but exam is limited by habitus.  Groins are negative for adenopathy.  EXTREMITIES:  She has 2+ nonpitting edema equal bilaterally.  PELVIC:  External genitalia is within normal limits.  Bimanual  examination is tolerated better today than it has been in the past.  There are no nodules or masses.  Rectovaginal confirms.  Exam is still  somewhat limited by habitus.   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   PLAN:  To follow results of her CA-125 from today.  She will return to  see me in 6 months.  She will continue her followup with her regularly  scheduled physician.      Paola A. Duard Brady, MD  Electronically Signed     PAG/MEDQ  D:  12/08/2008  T:  12/08/2008  Job:  [id number]   cc:   Telford Nab, R.N.  [address]  [place], [place] [address]   Rose Phi. Myna Hidalgo, M.D.  Fax: [phone]   Marcos Eke. Hal Hope, M.D.  Fax: [phone]

## 2010-12-27 ENCOUNTER — Other Ambulatory Visit: Payer: Self-pay | Admitting: Gynecologic Oncology

## 2010-12-27 ENCOUNTER — Ambulatory Visit: Payer: BC Managed Care – PPO | Attending: Gynecologic Oncology | Admitting: Gynecologic Oncology

## 2010-12-27 DIAGNOSIS — Z79899 Other long term (current) drug therapy: Secondary | ICD-10-CM | POA: Insufficient documentation

## 2010-12-27 DIAGNOSIS — D279 Benign neoplasm of unspecified ovary: Secondary | ICD-10-CM | POA: Insufficient documentation

## 2010-12-28 NOTE — Consult Note (Signed)
  NAMEDONYEL, CASTAGNOLA           ACCOUNT NO.:  [id number]  MEDICAL RECORD NO.:  [id number]           PATIENT TYPE:  O  LOCATION:  GYN                          FACILITY:  [facility]  PHYSICIAN:  Nakyiah Kuck A. Duard Brady, MD    DATE OF BIRTH:  01-06-1966  DATE OF CONSULTATION:  12/27/2010 DATE OF DISCHARGE:                                CONSULTATION   HISTORY:  Ms. Carbonell is a very pleasant 45 year old who in March of 2009 underwent exploratory laparotomy, TAH-BSO for 1A mucinous low malignant potential tumor of the ovary.  CA125 at the time of diagnosis was 194 and it has been normal since that time.  I last saw her in November of 2011 at which time her exam was unremarkable.  In addition, her CA-125 was normal.  She comes in today for followup.  She is overall doing quite well.  She has lost about 15 pounds since we last saw her. She has cut back on her sweets which has really helped.  REVIEW OF SYSTEMS:  From review of systems standpoint, she denies any chest pain, shortness of breath, nausea, vomiting, fevers, chills, headaches, visual changes, unintentional weight loss or weight gain. She did have a URI which is resolved without antibiotics.  It sounds like it affected her mother and her father as well.  They both required antibiotics due to other coexisting medical conditions.  She denies any change in bowel or bladder habits and is feeling quite well.  MEDICATIONS:  Include her Synthroid, lisinopril and Triplex for cholesterol.  HEALTH MAINTENANCE:  She is up-to-date on her mammogram having had one in April of 2012.  PHYSICAL EXAMINATION:  VITAL SIGNS:  Weight 246 pounds, down from 261 pounds; blood pressure 112/78; pulse 70; respirations 18; temperature 98.2. GENERAL:  A well-nourished, well-developed female, in no acute distress. NECK:  Supple.  There is no lymphadenopathy, no thyromegaly. LUNGS:  Clear to auscultation bilaterally. CARDIOVASCULAR EXAM:  Regular rate and  rhythm. ABDOMEN:  Obese.  She has a well-healed vertical midline incision. There is no evidence of incisional hernia. ABDOMEN:  Soft, nontender, nondistended.  There are no palpable masses or hepatosplenomegaly but exam is somewhat limited by habitus.  Groins are negative for adenopathy. EXTREMITIES:  There is 2+ to 3+ nonpitting edema, equal bilaterally. PELVIC:  External genitalia is within normal limits.  Bimanual examination reveals no masses or nodularity.  Rectal confirms.  Exam is somewhat limited by habitus.  ASSESSMENT:  A 45 year old with a stage IA mucinous low-malignant potential tumor of the ovary who was diagnosed and treated 3 years ago with no clinical evidence of recurrent disease.  Plan to follow up on results of her CA-125 when the patient will return to see [place] in 6 months.     Mera Gunkel A. Duard Brady, MD     PAG/MEDQ  D:  12/27/2010  T:  12/27/2010  Job:  [id number]  cc:   Telford Nab, R.N. [address] [place], [place] [address]  Josph Macho, M.D. Fax: [phone]  Marcos Eke. Hal Hope, M.D. Fax: [phone]  Electronically Signed by Cleda Mccreedy MD on 12/28/2010 02:25:09 PM

## 2010-12-29 NOTE — H&P (Signed)
[facility]  Patient:    [name] [name]                  MRN: [id number] Adm. Date:  [id number] Attending:  Allean Found CC:         Rose Phi. Myna Hidalgo, M.D.                         History and Physical  DATE OF BIRTH:  [birth year].  DIAGNOSIS ON ADMISSION:  Pancytopenia.  CHIEF COMPLAINT:  Fatigue and cough.  HISTORY OF PRESENT ILLNESS:  The patient is a 45 year old single female with three-week history of a nonproductive cough and laryngitis.  Over the past three weeks, she has had increasing fatigue, generalized weakness, night sweats, low-grade fever.   She has noticed a yellowing of her skin.  Her past history is unremarkable for any surgeries, illnesses or drug use.  She was seen at the [facility] last week secondary to the cough.  She was started on some Profen II DM to which she had a severe reaction, causing some nausea and vomiting.  She states it has lasted over the past four days.  At the present time, she denies any pain except some mild discomfort in her back. She has had some night sweats and nose bleeds; otherwise, she states she has been in fairly good health.  MEDICATIONS:  Currently on no medications, although she recently was using some Profen II DM.  ALLERGIES:  She is not allergic to any medications.  PAST SURGICAL HISTORY:  She has had no surgery.  OBSTETRICAL HISTORY:  She is gravida 0, para 0.  PAST MEDICAL HISTORY:  Hospitalized never.  She had a history of croup as a child.  FAMILY HISTORY:  Patient was adopted and there is no known family history.  SOCIAL HISTORY:  Patient currently working.  Does not smoke.  She does live with her adopted parents.  She has one Shelty dog in the house.  Does not currently exercise over the past three or four weeks.  She graduated from [employer] in [place].  REVIEW OF SYSTEMS:  Patient does have some mild cough, laryngitis, weakness and  some mild ankle swelling; otherwise, she states she is feeling fairly well except for the fatigue.  PHYSICAL EXAMINATION:  VITAL SIGNS:  Initial blood pressure 160/82, pulse 121, respiratory rate of 20, temperature of 98.2; this was on admission to the ER.  She had orthostatics done, with blood pressure lying of 140/68, sitting 157/78 and standing 139/58.  GENERAL:  She is an alert patient with a hoarse voice.  HEENT:  Her TMs reveal pale canals but the TMs are normal otherwise.  Throat is clear.  I did not see any petechiae; pale color, however.  She has good dentition.  Pupils equal, round and reactive to light.  Extraocular muscles are intact bilaterally.  She does have subconjunctival hemorrhages at the limbus of the right eye on the right and the left at the side of the eye.  Her sclerae are clear otherwise.  Left eye appears normal.  NECK:  Supple without any lymph nodes palpable.  LUNGS:  No rales or rhonchi heard.  HEART:  Hyperdynamic with a 2/6 systolic ejection murmur with some mild tachycardia.  No mid-systolic click heard; otherwise, unremarkable.  BREASTS:  Unremarkable with no changes.  SKIN:  Her skin shows a slight yellow tint to it versus tanning.  No suspicious moles seen.  ABDOMEN:  Soft, nontender.  No hepatosplenomegaly appreciated.  No masses and no other abnormalities.  PELVIC:  Patient deferred; patient not sexually active.  NEUROLOGIC:  Her reflexes are diminished bilaterally but symmetrical.  Her strength in the large muscle groups is 4-5/5.  EXTREMITIES:  She has trace edema in the ankles with normal PT and DP pulses. Capillary refill is slightly sluggish.  RECTAL:  Exam is pending; Dr. Rose Phi. Ennever did it on his exam.  LABORATORY WORK:  WBC count of 2.3 with a differential of 51 segs, 42 lymphocytes, no monos, 1 eosinophil and 1 basophil.  Hemoglobin 3.0, hematocrit 9.3, MCV is 106.3 with 41,000 platelets.  She has marked polychromasia,  a tear drop cell, shistocytes and oval macrocytes.  SGOT is 46. SGPT is 21.  Alkaline phosphatase is 39.  LDH is pending.  PTT is 26 and normal.  PT is 20.2 with an INR of 2.3.  She has a total bilirubin of 2.5, direct bilirubin of 0.4, indirect bilirubin of 2.1.  Chest x-ray was done which shows airway with interstitial thickening of bronchioles.  ASSESSMENT AND PLAN: 1. Pancytopenia:  Dr. Myna Hidalgo was contacted to help delineate and initiate    treatment for patient appeared to be initially leukemic; however,    Dr. Myna Hidalgo looked at the blood smear and he feels that most likely it is    B12 deficiency, severe.  He is planning to do a bone marrow biopsy first    thing in the morning. Tonight, patient will be placed in an intensive care    unit room for good observation.  She will be given intravenous fluids,    something to help her sleep and we will continue to follow her closely.    She also will continue to receive more packed red blood cell units over the    nighttime. 2. Cough/laryngitis:  This appears viral.  Continue to follow.  Will give her    some Tessalon Perles if the cough is bothering her. 3. Patient has some anxiety:  Will treat with Xanax 0.25 mg p.o. as needed    q.8h. 4. At this time, LDH is pending, B12 level is pending and the bone marrow is    scheduled for tomorrow morning. 5. Patient does not have a regular physician.  She will be followed outpatient    through [facility]. DD:  12/26/99 TD:  12/26/99 Job: [id number]

## 2010-12-29 NOTE — Discharge Summary (Signed)
[facility]  Patient:    [name], [name]                  MRN: [id number] Adm. Date:  [id number] Disc. Date: [id number] Attending:  Rosanne Sack CC:         Rose Phi. Myna Hidalgo, M.D.             Kern Reap, M.D.                           Discharge Summary  DATE OF BIRTH:  02/17/1966.  DISCHARGE DIAGNOSES: 1. Bone marrow failure, severe pancytopenia secondary to newly diagnosed    pernicious anemia.    a. Positive anti-parietal cell antibodies.    b. Status post bone marrow biopsy and aspirate.    c. Discharge hemoglobin 6.0, white blood cell count 1.4, platelets 12,000. 2. Acute bronchitis, improved. 3. Prerenal azotemia, resolved. 4. Transient hypokalemia, resolved. 5. Transient metabolic acidosis secondary to problem #1, resolved.  DISCHARGE MEDICATIONS: 1. Vitamin B12 1000 mcg tomorrow x 1, then q.wk. for four weeks, then q.mo.    for life. 2. Levaquin 500 mg p.o. q.d. x 7 days. 3. Humibid LA two tablets p.o. b.i.d. x 7 days.  DISCHARGE FOLLOWUP AND DISPOSITION:  [name] will receive vitamin B12 shots tomorrow and then once a week for four weeks and then once a month for life at Dr. Rose Phi. Ennevers office.  Dr. Myna Hidalgo will be charge of following the patients pancytopenia upon discharge.  Dr. Kern Reap will become the patients primary care Dmetrius Ambs. Dr. Barbee Shropshire will see Mrs. Rodriquez on June 1st, Friday, at 11:15 a.m. Dr. Barbee Shropshire is to follow [name] from the primary care standpoint.  We recommend to reassess the patients acute bronchitis upon discharge.  If complications like bleeding or infection were to arise upon discharge, Dr. Myna Hidalgo will be in charge of managing this patient and/or admitting to the hospital if needed, given the patients severe pancytopenia.  CONSULTANT:  Dr. Arlan Organ (hematology).  PROCEDURES: 1. Bone marrow aspirate and biopsy. 2. RBC transfusion x 4  units.  LABORATORY FINDINGS:  Reticulocyte count 3.3%.  Absolute reticulocyte count 72.9.  LDH 3768.  Anti-parietal cell antibodies positive.  HIV negative.  TSH 2.655.  Legionella urine antigen negative.  Cortisol 15.8 at 8 a.m.  Vitamin B12 level 88.  Sodium 134, potassium 4.1, chloride 109, CO2 22, BUN 6, creatinine 0.6, glucose of 115.  Total bilirubin 1.8, alkaline phosphatase 35, SGOT 23, GPT 16, albumin 3.5, total protein 5.7, calcium 8.7, RBC folate 324.  HOSPITAL COURSE:  [name] is a very pleasant 45 year old female admitted to [facility] on Dec 24, 1999 with complaints of generalized weakness, shortness of breath for about three weeks.  Please see admission H&P by Dr. Katrinka Blazing for further details regarding the history of presentation, physical exam and lab data.  #1 - SEVERE PANCYTOPENIA SECONDARY TO NEWLY DIAGNOSED PERNICIOUS ANEMIA:  The initial CBC showed pancytopenia with a hemoglobin of 3.0 and an MCV of 106. The platelet count was 41,000 and the white cell count was 2.3.  The absolute neutrophil count was 1.2 and the absolute lymphocyte count was low at 1.0. The reticulocyte count was 1.1% and the absolute reticulocyte count was 0.87. The LDH was 6657.  Due to this profound pancytopenia, a hematological consult was obtained.  The blood smear revealed hypersegmented neutrophils suspicious for vitamin B12 deficiency.  With intravenous  hydration therapy, the white cell count dropped to 0.8.  The platelet count dropped to 12,000 from 15,000. The patient received two units of RBC transfusion during the first day.  The followup hemoglobin remained in the 4.2 to 5.0 range.  No signs of active bleeding were noticed.  Ms. [name] had guaiac-negative stools.  A bone marrow biopsy and aspirate were obtained on day #2 of hospital stay.  No evidence of malignancy was noticed in the evaluation of the bone marrow.  The iron stores were normal.  The RBC folate was  normal.  The vitamin B12 level was low at 88.  The anti-parietal antibodies were positive.  Vitamin B12 was started daily.  With this therapy, the patients reticulocyte count increased from 1.1% to 3.3% at the time of discharge.  Patient required two additional units of RBC transfusion.  No complications were noticed with either transfusion and the one on day 3.  Patients hemoglobin remained stable at 6.0 to 6.8 range.  No petechiae were noticed.  Occasional nose bleed was reported by the patient.  These nose bleeds were completely controlled with pressure maneuvers.  Other studies like HIV were negative.  We concluded that the patients bone marrow failure was due to profound vitamin B12 deficiency due to pernicious anemia.  [name] was deemed to be medically ready on Dec 29, 1999.  Acute bronchitis was the only concurrent medical problem. Please see problem #2.  The patient will continue receiving vitamin B12. Tomorrow, the patient will return to Dr. Tama Gander office to receive another shot of vitamin B12.  Afterwards, she will receive this once a week for four weeks and then once a month for life.  #2 - ACUTE BRONCHITIS:  The patient presented with a productive cough of about five days duration.  The initial chest x-ray was consistent with increased interstitial markings.  The patient did not have any signs of hypoxemia. Sputum studies were obtained to rule out atypical pneumonia.  A PPD skin test was placed.  Given the severe pancytopenia, atypical pneumonia like Legionella, tuberculosis, fungal pneumonias or PCP were considered.  The patient was placed empirically on Levaquin.  The followup chest x-ray showed significant improvement of the interstitial ______ .  In retrospect, we believe that chest x-ray technique plus a possible transient high output heart failure due to profound anemia may account for the chest x-ray findings. Clinically, the patient had acute bacterial  bronchitis that responded ______ to Levaquin.  The patient will remain on this therapy for seven more days upon discharge.   #3 - TRANSIENT HYPOKALEMIA:  Mrs. Lockner received transient potassium supplementation for her hypokalemia.  At the time of discharge, the patients serum potassium was within the normal limits.  #4 - PRERENAL AZOTEMIA:  Intravenous hydration therapy was used for the first two days of hospitalization, until the patients prerenal azotemia resolved clinically.  MEDICAL CONDITION AT THE TIME OF DISCHARGE:  Improved. DD:  12/29/99 TD:  01/02/00 Job: [id number]

## 2011-01-09 ENCOUNTER — Encounter (HOSPITAL_BASED_OUTPATIENT_CLINIC_OR_DEPARTMENT_OTHER): Payer: BC Managed Care – PPO | Admitting: Oncology

## 2011-01-09 DIAGNOSIS — D510 Vitamin B12 deficiency anemia due to intrinsic factor deficiency: Secondary | ICD-10-CM

## 2011-02-05 ENCOUNTER — Encounter (HOSPITAL_BASED_OUTPATIENT_CLINIC_OR_DEPARTMENT_OTHER): Payer: BC Managed Care – PPO | Admitting: Oncology

## 2011-02-05 DIAGNOSIS — D510 Vitamin B12 deficiency anemia due to intrinsic factor deficiency: Secondary | ICD-10-CM

## 2011-03-05 ENCOUNTER — Encounter (HOSPITAL_BASED_OUTPATIENT_CLINIC_OR_DEPARTMENT_OTHER): Payer: BC Managed Care – PPO | Admitting: Oncology

## 2011-03-05 DIAGNOSIS — D510 Vitamin B12 deficiency anemia due to intrinsic factor deficiency: Secondary | ICD-10-CM

## 2011-04-02 ENCOUNTER — Other Ambulatory Visit: Payer: Self-pay | Admitting: Hematology & Oncology

## 2011-04-02 ENCOUNTER — Encounter (HOSPITAL_BASED_OUTPATIENT_CLINIC_OR_DEPARTMENT_OTHER): Payer: BC Managed Care – PPO | Admitting: Hematology & Oncology

## 2011-04-02 DIAGNOSIS — E559 Vitamin D deficiency, unspecified: Secondary | ICD-10-CM

## 2011-04-02 DIAGNOSIS — D510 Vitamin B12 deficiency anemia due to intrinsic factor deficiency: Secondary | ICD-10-CM

## 2011-04-02 LAB — CBC WITH DIFFERENTIAL (CANCER CENTER ONLY)
BASO#: 0 10*3/uL (ref 0.0–0.2)
EOS%: 1.5 % (ref 0.0–7.0)
HCT: 41.2 % (ref 34.8–46.6)
HGB: 14.7 g/dL (ref 11.6–15.9)
LYMPH#: 1.6 10*3/uL (ref 0.9–3.3)
MCH: 29.6 pg (ref 26.0–34.0)
MCHC: 35.7 g/dL (ref 32.0–36.0)
MCV: 83 fL (ref 81–101)
NEUT%: 63 % (ref 39.6–80.0)

## 2011-04-02 LAB — CHCC SATELLITE - SMEAR

## 2011-04-03 LAB — RETICULOCYTES (CHCC): Retic Ct Pct: 1.6 % (ref 0.4–2.3)

## 2011-04-03 LAB — VITAMIN D 25 HYDROXY (VIT D DEFICIENCY, FRACTURES): Vit D, 25-Hydroxy: 37 ng/mL (ref 30–89)

## 2011-04-03 LAB — SEDIMENTATION RATE: Sed Rate: 5 mm/hr (ref 0–22)

## 2011-04-03 LAB — FERRITIN: Ferritin: 203 ng/mL (ref 10–291)

## 2011-05-03 DIAGNOSIS — D3910 Neoplasm of uncertain behavior of unspecified ovary: Secondary | ICD-10-CM | POA: Insufficient documentation

## 2011-06-18 ENCOUNTER — Encounter: Payer: Self-pay | Admitting: *Deleted

## 2011-06-18 DIAGNOSIS — D3910 Neoplasm of uncertain behavior of unspecified ovary: Secondary | ICD-10-CM

## 2011-06-18 HISTORY — DX: Neoplasm of uncertain behavior of unspecified ovary: D39.10

## 2011-06-20 ENCOUNTER — Ambulatory Visit: Payer: BC Managed Care – PPO | Admitting: Gynecologic Oncology

## 2011-07-02 ENCOUNTER — Ambulatory Visit (HOSPITAL_BASED_OUTPATIENT_CLINIC_OR_DEPARTMENT_OTHER): Payer: BC Managed Care – PPO

## 2011-07-02 ENCOUNTER — Other Ambulatory Visit: Payer: Self-pay | Admitting: Hematology & Oncology

## 2011-07-02 ENCOUNTER — Encounter: Payer: Self-pay | Admitting: Family

## 2011-07-02 ENCOUNTER — Other Ambulatory Visit: Payer: Self-pay | Admitting: Family

## 2011-07-02 VITALS — BP 113/75 | HR 76 | Temp 97.1°F

## 2011-07-02 DIAGNOSIS — D510 Vitamin B12 deficiency anemia due to intrinsic factor deficiency: Secondary | ICD-10-CM

## 2011-07-02 HISTORY — DX: Vitamin B12 deficiency anemia due to intrinsic factor deficiency: D51.0

## 2011-07-02 MED ORDER — CYANOCOBALAMIN 1000 MCG/ML IJ SOLN
1000.0000 ug | Freq: Once | INTRAMUSCULAR | Status: AC
  Administered 2011-07-02: 1000 ug via INTRAMUSCULAR

## 2011-07-02 MED ORDER — CYANOCOBALAMIN 1000 MCG/ML IJ SOLN: 1000.0000 ug | Freq: Once | INTRAMUSCULAR | Status: DC

## 2011-07-23 ENCOUNTER — Ambulatory Visit (INDEPENDENT_AMBULATORY_CARE_PROVIDER_SITE_OTHER): Payer: BC Managed Care – PPO | Admitting: Family Medicine

## 2011-07-23 DIAGNOSIS — E039 Hypothyroidism, unspecified: Secondary | ICD-10-CM

## 2011-07-23 DIAGNOSIS — E781 Pure hyperglyceridemia: Secondary | ICD-10-CM

## 2011-07-23 DIAGNOSIS — I10 Essential (primary) hypertension: Secondary | ICD-10-CM

## 2011-07-25 ENCOUNTER — Ambulatory Visit: Payer: BC Managed Care – PPO

## 2011-07-25 ENCOUNTER — Ambulatory Visit: Payer: BC Managed Care – PPO | Attending: Gynecologic Oncology | Admitting: Gynecologic Oncology

## 2011-07-25 ENCOUNTER — Encounter: Payer: Self-pay | Admitting: Gynecologic Oncology

## 2011-07-25 VITALS — BP 122/70 | HR 74 | Temp 97.4°F | Resp 18 | Ht 67.72 in | Wt 252.3 lb

## 2011-07-25 DIAGNOSIS — Z09 Encounter for follow-up examination after completed treatment for conditions other than malignant neoplasm: Secondary | ICD-10-CM | POA: Insufficient documentation

## 2011-07-25 DIAGNOSIS — D4959 Neoplasm of unspecified behavior of other genitourinary organ: Secondary | ICD-10-CM

## 2011-07-25 DIAGNOSIS — Z9079 Acquired absence of other genital organ(s): Secondary | ICD-10-CM | POA: Insufficient documentation

## 2011-07-25 DIAGNOSIS — Z79899 Other long term (current) drug therapy: Secondary | ICD-10-CM | POA: Insufficient documentation

## 2011-07-25 DIAGNOSIS — Z90710 Acquired absence of both cervix and uterus: Secondary | ICD-10-CM | POA: Insufficient documentation

## 2011-07-25 DIAGNOSIS — I10 Essential (primary) hypertension: Secondary | ICD-10-CM | POA: Insufficient documentation

## 2011-07-25 DIAGNOSIS — D510 Vitamin B12 deficiency anemia due to intrinsic factor deficiency: Secondary | ICD-10-CM | POA: Insufficient documentation

## 2011-07-25 LAB — CA 125: CA 125: 17.1 U/mL (ref 0.0–30.2)

## 2011-07-25 NOTE — Patient Instructions (Signed)
RTC in 6 months.

## 2011-07-25 NOTE — Progress Notes (Signed)
Consult Note: Gyn-Onc  [name] 45 y.o. female  CC:  Chief Complaint  Patient presents with  . 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 CA125 at the time of diagnosis  was 194 and it has been normal since that time. I last saw her in  5/12 at which time her exam was unremarkable. In addition, her CA-125 was normal. She comes in today for followup. She is overall  doing quite well. She has gained about 6 pounds since we last saw her.    REVIEW OF SYSTEMS: From review of systems standpoint, she denies any  chest pain, shortness of breath, nausea, vomiting, fevers, chills,  headaches, visual changes, unintentional weight loss or weight gain.She denies any  change in bowel or bladder habits and is feeling quite well. She will be getting together with her family over the holidays. She is up-to-date on her mammograms the last mammogram being in April.   Interval History:   Review of Systems: As above. 10 point ROS is negative.  Current Meds:  Outpatient Encounter Prescriptions as of 07/25/2011  Medication Sig Dispense Refill  . Chlorphen-Pyril-Phenyleph (TRIPLEX AD PO) Take 1 tablet by mouth daily.       . cholecalciferol (VITAMIN D) 1000 UNITS tablet Take 1,000 Units by mouth daily.        . Levothyroxine Sodium (SYNTHROID PO) Take 0.05 mg by mouth daily.       [name] lisinopril-hydrochlorothiazide (PRINZIDE,ZESTORETIC) 10-12.5 MG per tablet Take 1 tablet by mouth daily.        . Multiple Vitamin (MULTIVITAMIN) tablet Take 1 tablet by mouth daily.        . sertraline (ZOLOFT) 50 MG tablet Take 50 mg by mouth daily.        . vitamin C (ASCORBIC ACID) 500 MG tablet Take 500 mg by mouth daily.        [name] DISCONTD: LISINOPRIL PO Take by mouth.         Allergy: No Known Allergies  Social Hx:   History   Social History   . Marital Status: Single    Spouse Name: N/A    Number of Children: N/A  . Years of Education: N/A   Occupational History  . Not on file.   Social History Main Topics  . Smoking status: Never Smoker   . Smokeless tobacco: Not on file  . Alcohol Use: No  . Drug Use: Not on file  . Sexually Active: No   Other Topics Concern  . Not on file   Social History Narrative  . No narrative on file    Past Surgical Hx:  Past Surgical History  Procedure Date  . Abdominal hysterectomy 2009    TAHBSO    Past Medical Hx:  Past Medical History  Diagnosis Date  . Anemia   . Hypertension   . Anxiety   . mucinous LMP tumor 06/18/2011  . Pernicious anemia 07/02/2011    Family Hx:  Family History  Problem Relation Age of Onset  . Adopted: Yes    Vitals:  Blood pressure 122/70, pulse 74, temperature 97.4 F (36.3 C), temperature source Oral, resp. rate 18, height 5' 7.72" (1.72 m), weight 252 lb 4.8 oz (114.443 kg).  Physical Exam: GENERAL: A well-nourished, well-developed female, in no acute distress.  NECK:  Supple. There is no lymphadenopathy, no thyromegaly.  LUNGS: Clear to auscultation bilaterally.  CARDIOVASCULAR EXAM: Regular rate and rhythm.  ABDOMEN: Obese. She has a well-healed vertical midline incision.  There is no evidence of incisional hernia.  ABDOMEN: Soft, nontender, nondistended. There are no palpable masses  or hepatosplenomegaly but exam is somewhat limited by habitus. Groins  are negative for adenopathy.  EXTREMITIES: There is 2+ to 3+ nonpitting edema, equal bilaterally.  PELVIC: External genitalia is within normal limits. Bimanual  examination reveals no masses or nodularity. Rectal confirms. Exam is  somewhat limited by habitus.     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  Plan to follow up on results of  her CA-125 when the patient will return to see [place] in 6 months.     Everson Mott A., MD 07/25/2011, 10:27 AM

## 2011-09-24 ENCOUNTER — Ambulatory Visit (HOSPITAL_BASED_OUTPATIENT_CLINIC_OR_DEPARTMENT_OTHER): Payer: BC Managed Care – PPO

## 2011-09-24 VITALS — BP 117/71 | HR 81 | Temp 97.6°F

## 2011-09-24 DIAGNOSIS — D510 Vitamin B12 deficiency anemia due to intrinsic factor deficiency: Secondary | ICD-10-CM

## 2011-09-24 MED ORDER — CYANOCOBALAMIN 1000 MCG/ML IJ SOLN
1000.0000 ug | Freq: Once | INTRAMUSCULAR | Status: AC
  Administered 2011-09-24: 1000 ug via INTRAMUSCULAR

## 2011-12-21 ENCOUNTER — Ambulatory Visit (HOSPITAL_BASED_OUTPATIENT_CLINIC_OR_DEPARTMENT_OTHER): Payer: BC Managed Care – PPO

## 2011-12-21 VITALS — BP 106/76 | HR 69 | Temp 97.3°F

## 2011-12-21 DIAGNOSIS — D510 Vitamin B12 deficiency anemia due to intrinsic factor deficiency: Secondary | ICD-10-CM

## 2011-12-21 MED ORDER — CYANOCOBALAMIN 1000 MCG/ML IJ SOLN
1000.0000 ug | Freq: Once | INTRAMUSCULAR | Status: AC
  Administered 2011-12-21: 1000 ug via INTRAMUSCULAR

## 2011-12-24 ENCOUNTER — Ambulatory Visit: Payer: BC Managed Care – PPO

## 2011-12-24 ENCOUNTER — Ambulatory Visit: Payer: BC Managed Care – PPO | Admitting: Family Medicine

## 2011-12-31 ENCOUNTER — Ambulatory Visit: Payer: BC Managed Care – PPO | Admitting: Family Medicine

## 2012-01-06 ENCOUNTER — Other Ambulatory Visit: Payer: Self-pay | Admitting: Family Medicine

## 2012-01-14 ENCOUNTER — Ambulatory Visit (INDEPENDENT_AMBULATORY_CARE_PROVIDER_SITE_OTHER): Payer: BC Managed Care – PPO | Admitting: Family Medicine

## 2012-01-14 ENCOUNTER — Encounter: Payer: Self-pay | Admitting: Family Medicine

## 2012-01-14 VITALS — BP 101/66 | HR 80 | Temp 97.5°F | Resp 16 | Ht 67.0 in | Wt 246.6 lb

## 2012-01-14 DIAGNOSIS — R7302 Impaired glucose tolerance (oral): Secondary | ICD-10-CM

## 2012-01-14 DIAGNOSIS — F32A Depression, unspecified: Secondary | ICD-10-CM

## 2012-01-14 DIAGNOSIS — F329 Major depressive disorder, single episode, unspecified: Secondary | ICD-10-CM

## 2012-01-14 DIAGNOSIS — E785 Hyperlipidemia, unspecified: Secondary | ICD-10-CM

## 2012-01-14 DIAGNOSIS — E039 Hypothyroidism, unspecified: Secondary | ICD-10-CM

## 2012-01-14 DIAGNOSIS — I10 Essential (primary) hypertension: Secondary | ICD-10-CM

## 2012-01-14 DIAGNOSIS — D229 Melanocytic nevi, unspecified: Secondary | ICD-10-CM

## 2012-01-14 LAB — LIPID PANEL
HDL: 32 mg/dL — ABNORMAL LOW (ref >39)
LDL Cholesterol: 86 mg/dL (ref 0–99)
Total CHOL/HDL Ratio: 4.6 Ratio
Triglycerides: 145 mg/dL (ref <150)
VLDL: 29 mg/dL (ref 0–40)

## 2012-01-14 LAB — COMPREHENSIVE METABOLIC PANEL
Alkaline Phosphatase: 42 U/L (ref 39–117)
Creat: 0.92 mg/dL (ref 0.50–1.10)
Glucose, Bld: 104 mg/dL — ABNORMAL HIGH (ref 70–99)
Sodium: 140 mEq/L (ref 135–145)
Total Bilirubin: 0.3 mg/dL (ref 0.3–1.2)
Total Protein: 7.5 g/dL (ref 6.0–8.3)

## 2012-01-14 MED ORDER — LEVOTHYROXINE SODIUM 50 MCG PO TABS: 50.0000 ug | ORAL_TABLET | Freq: Every day | ORAL | Status: DC

## 2012-01-14 MED ORDER — LISINOPRIL-HYDROCHLOROTHIAZIDE 10-12.5 MG PO TABS: 1.0000 | ORAL_TABLET | Freq: Every day | ORAL | Status: DC

## 2012-01-14 MED ORDER — SERTRALINE HCL 50 MG PO TABS: 50.0000 mg | ORAL_TABLET | Freq: Every day | ORAL | Status: DC

## 2012-01-14 NOTE — Progress Notes (Signed)
  Subjective:    Patient ID: [name], female    DOB: [birth year], 46 y.o.   MRN: [id number]  HPI  Patient presents in follow up of multiple medical problems.  Doing well without complaints.  Walking regularly and beginning to lose weight. Monitoring salt intake and has essentially eliminated fried foods.  Multiple dysplastic nevi; recent OV with Dr. Londell Moh 4/13  Mucinous low malignancy potential(LMP)  Ovarian tumor- CA 125 markers remain unchanged  Pernicious anemia- Monthly B 12 injections at the [facility]  Review of Systems     Objective:   Physical Exam  Neck: Neck supple. No thyromegaly present.  Cardiovascular: Normal rate, regular rhythm and normal heart sounds.   Pulmonary/Chest: Effort normal and breath sounds normal.  Abdominal: Soft. Bowel sounds are normal. She exhibits no distension and no mass.  Musculoskeletal: Normal range of motion.  Neurological: She is alert.  Skin: Skin is warm.  Psychiatric: She has a normal mood and affect.     Results for orders placed in visit on 01/14/12  POCT GLYCOSYLATED HEMOGLOBIN (HGB A1C)      Component Value Range   Hemoglobin A1C 5.4          Assessment & Plan:   1. HTN (hypertension)  Comprehensive metabolic panel, lisinopril-hydrochlorothiazide (PRINZIDE,ZESTORETIC) 10-12.5 MG per tablet  2. Hypothyroid  TSH, levothyroxine (SYNTHROID, LEVOTHROID) 50 MCG tablet  3. Dyslipidemia  Lipid panel  4. Glucose intolerance (impaired glucose tolerance)  POCT glycosylated hemoglobin (Hb A1C)  5. Depression  sertraline (ZOLOFT) 50 MG tablet  6. Multiple nevi  Keep dermatologic follow up

## 2012-01-15 ENCOUNTER — Ambulatory Visit: Payer: BC Managed Care – PPO | Attending: Gynecologic Oncology | Admitting: Gynecologic Oncology

## 2012-01-15 ENCOUNTER — Ambulatory Visit: Payer: BC Managed Care – PPO

## 2012-01-15 ENCOUNTER — Encounter: Payer: Self-pay | Admitting: Gynecologic Oncology

## 2012-01-15 VITALS — BP 122/72 | HR 68 | Temp 97.8°F | Resp 20 | Ht 67.72 in | Wt 251.6 lb

## 2012-01-15 DIAGNOSIS — Z09 Encounter for follow-up examination after completed treatment for conditions other than malignant neoplasm: Secondary | ICD-10-CM | POA: Insufficient documentation

## 2012-01-15 DIAGNOSIS — I10 Essential (primary) hypertension: Secondary | ICD-10-CM | POA: Insufficient documentation

## 2012-01-15 DIAGNOSIS — F411 Generalized anxiety disorder: Secondary | ICD-10-CM | POA: Insufficient documentation

## 2012-01-15 DIAGNOSIS — D3910 Neoplasm of uncertain behavior of unspecified ovary: Secondary | ICD-10-CM

## 2012-01-15 DIAGNOSIS — Z87898 Personal history of other specified conditions: Secondary | ICD-10-CM | POA: Insufficient documentation

## 2012-01-15 DIAGNOSIS — Z90710 Acquired absence of both cervix and uterus: Secondary | ICD-10-CM | POA: Insufficient documentation

## 2012-01-15 NOTE — Patient Instructions (Signed)
Plan to follow up on results of her CA-125. F/U  With GYN ONC in 6 months.

## 2012-01-15 NOTE — Progress Notes (Signed)
Consult Note: Gyn-Onc  [name] 46 y.o. female  CC:  Chief Complaint  Patient presents with  . 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 CA125 at the time of diagnosis  was 194 and it has been normal since that time.Initial CA 125 at the time of presentation was 194. She comes in today for followup. She is essentially without complaints.  REVIEW OF SYSTEMS: From review of systems standpoint, she denies any  chest pain, shortness of breath, nausea, vomiting, fevers, chills,  headaches, visual changes, unintentional weight loss or weight gain. She denies any  change in bowel or bladder habits and is feeling quite well. She is up-to-date on her mammograms the last mammogram being in May 2013 that was normal.  Otherwise 10 point ROS negative.   Current Meds:  Outpatient Encounter Prescriptions as of 01/15/2012  Medication Sig Dispense Refill  . Chlorphen-Pyril-Phenyleph (TRIPLEX AD PO) Take 1 tablet by mouth daily.       . cholecalciferol (VITAMIN D) 1000 UNITS tablet Take 1,000 Units by mouth daily.        [name] levothyroxine (SYNTHROID, LEVOTHROID) 50 MCG tablet Take 1 tablet (50 mcg total) by mouth daily.  90 tablet  2  . Levothyroxine Sodium (SYNTHROID PO) Take 0.05 mg by mouth daily.       [name] lisinopril-hydrochlorothiazide (PRINZIDE,ZESTORETIC) 10-12.5 MG per tablet Take 1 tablet by mouth daily.  90 tablet  2  . Multiple Vitamin (MULTIVITAMIN) tablet Take 1 tablet by mouth daily.        . sertraline (ZOLOFT) 50 MG tablet Take 1 tablet (50 mg total) by mouth daily.  90 tablet  2  . vitamin C (ASCORBIC ACID) 500 MG tablet Take 500 mg by mouth daily.          Allergy: No Known Allergies  Social Hx:   History   Social History  . Marital Status: Single    Spouse Name: N/A    Number of Children: N/A  . Years of Education:  N/A   Occupational History  . Not on file.   Social History Main Topics  . Smoking status: Never Smoker   . Smokeless tobacco: Not on file  . Alcohol Use: No  . Drug Use: Not on file  . Sexually Active: No   Other Topics Concern  . Not on file   Social History Narrative  . No narrative on file    Past Surgical Hx:  Past Surgical History  Procedure Date  . Abdominal hysterectomy 2009    TAHBSO    Past Medical Hx:  Past Medical History  Diagnosis Date  . Anemia   . Hypertension   . Anxiety   . mucinous LMP tumor 06/18/2011  . Pernicious anemia 07/02/2011    Family Hx:  Family History  Problem Relation Age of Onset  . Adopted: Yes    Vitals:  Blood pressure 122/72, pulse 68, temperature 97.8 F (36.6 C), resp. rate 20, height 5' 7.72" (1.72 m), weight 251 lb 9.6 oz (114.125 kg).  Physical Exam: GENERAL: A well-nourished, well-developed female, in no acute distress.  NECK: Supple. There is no lymphadenopathy, no thyromegaly.  LUNGS: Clear to auscultation bilaterally.  CARDIOVASCULAR EXAM: Regular rate and rhythm.  ABDOMEN: Soft, nontender, nondistended. There are no palpable masses  or hepatosplenomegaly but  exam is somewhat limited by habitus. Groins  are negative for adenopathy.  EXTREMITIES: There is 2+ to 3+ nonpitting edema, equal bilaterally.  PELVIC: External genitalia is within normal limits. Bimanual  examination reveals no masses or nodularity. Rectal confirms. Exam is  somewhat limited by habitus.  RECTAL:  Good tone no masses.    Assessment/Plan: 46 year old with a stage IA mucinous low-malignant  potential tumor of the ovary who was diagnosed and treated >4 years ago and remains  with no clinical evidence of recurrent disease.  Plan to follow up on results of her CA-125. F/U  With GYN ONC in 6 months.     Laurette Schimke, MD 01/15/2012, 3:08 PM

## 2012-01-16 ENCOUNTER — Telehealth: Payer: Self-pay | Admitting: Gynecologic Oncology

## 2012-01-16 ENCOUNTER — Ambulatory Visit: Payer: BC Managed Care – PPO | Admitting: Gynecologic Oncology

## 2012-01-16 NOTE — Telephone Encounter (Signed)
Pt notified about lab results: CA 125 of 16.1.  No concerns or questions voiced.

## 2012-03-31 ENCOUNTER — Other Ambulatory Visit: Payer: BC Managed Care – PPO | Admitting: Lab

## 2012-03-31 ENCOUNTER — Ambulatory Visit (HOSPITAL_BASED_OUTPATIENT_CLINIC_OR_DEPARTMENT_OTHER): Payer: BC Managed Care – PPO

## 2012-03-31 ENCOUNTER — Ambulatory Visit (HOSPITAL_BASED_OUTPATIENT_CLINIC_OR_DEPARTMENT_OTHER): Payer: BC Managed Care – PPO | Admitting: Hematology & Oncology

## 2012-03-31 VITALS — BP 120/66 | HR 84 | Temp 97.6°F | Resp 20 | Ht 67.0 in | Wt 252.0 lb

## 2012-03-31 DIAGNOSIS — D510 Vitamin B12 deficiency anemia due to intrinsic factor deficiency: Secondary | ICD-10-CM

## 2012-03-31 LAB — CBC WITH DIFFERENTIAL (CANCER CENTER ONLY)
Eosinophils Absolute: 0.1 10*3/uL (ref 0.0–0.5)
HCT: 40.3 % (ref 34.8–46.6)
HGB: 13.8 g/dL (ref 11.6–15.9)
LYMPH%: 18.9 % (ref 14.0–48.0)
MCV: 84 fL (ref 81–101)
MONO#: 0.7 10*3/uL (ref 0.1–0.9)
Platelets: 180 10*3/uL (ref 145–400)
RBC: 4.78 10*6/uL (ref 3.70–5.32)
WBC: 7.5 10*3/uL (ref 3.9–10.0)

## 2012-03-31 LAB — RETICULOCYTES (CHCC): ABS Retic: 62.7 10*3/uL (ref 19.0–186.0)

## 2012-03-31 LAB — CHCC SATELLITE - SMEAR

## 2012-03-31 LAB — VITAMIN B12: Vitamin B-12: 367 pg/mL (ref 211–911)

## 2012-03-31 MED ORDER — CYANOCOBALAMIN 1000 MCG/ML IJ SOLN
1000.0000 ug | Freq: Once | INTRAMUSCULAR | Status: AC
  Administered 2012-03-31: 1000 ug via INTRAMUSCULAR

## 2012-03-31 NOTE — Progress Notes (Signed)
This office note has been dictated.

## 2012-03-31 NOTE — Progress Notes (Signed)
CC:   Clydie Braun L. Hal Hope, M.D. Paola A. 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  CURRENT THERAPY:  Vitamin B12 1 mg IM q. month.  INTERVAL HISTORY:  [name] comes in for her yearly followup with me.  She does B12 every 3 months.  She is doing well with this.  She has had no complaints.  She had an ovarian tumor 4 years ago.  She had a hysterectomy for this. As such, there is no monthly cycle.  She has had no rashes.  There has been no change in her medications. She is on the following medication for hypothyroidism.  She has had no change in bowel or bladder habits.  There has been no cough or shortness breath.  PHYSICAL EXAMINATION:  General:  This is a well-developed, well- nourished, white female in no obvious distress.  Vital Signs: 97.6, pulse 66, respiratory rate 18, blood pressure 120/66.  Weight is 252. Head and Neck:  Normocephalic, atraumatic skull.  There are no ocular or oral lesions.  There are no palpable cervical or supraclavicular lymph nodes.  Lungs:  Clear bilaterally.  Cardiac:  Regular rate and rhythm with normal S1 and S2.  There are no murmurs, rubs, or bruits.  Abdomen: Soft with good bowel sounds.  There is no palpable abdominal mass. There is no palpable hepatosplenomegaly.  She does have a laparotomy scar that is well healed.  Extremities:  No clubbing, cyanosis, or edema.  Neurological:  No focal neurological deficits.  LABORATORY STUDIES:  White cell count is 7.5, hemoglobin 13.8, hematocrit 40.3, platelet count 180.  MCV is 84.  IMPRESSION:  [name] is a 46 year old white female with pernicious anemia.  She presented back in 2001.  She had antiparietal cell antibodies.  She will get her B12 shot today.  She will come back every 3 months for her B12 shot.  I will plan to see her back next year myself.    ______________________________ Josph Macho, M.D. PRE/MEDQ  D:  03/31/2012  T:   03/31/2012  Job:  [id number]

## 2012-06-29 ENCOUNTER — Other Ambulatory Visit: Payer: Self-pay | Admitting: Family Medicine

## 2012-07-01 ENCOUNTER — Ambulatory Visit (HOSPITAL_BASED_OUTPATIENT_CLINIC_OR_DEPARTMENT_OTHER): Payer: BC Managed Care – PPO

## 2012-07-01 VITALS — BP 106/73 | HR 79 | Temp 96.7°F

## 2012-07-01 DIAGNOSIS — D510 Vitamin B12 deficiency anemia due to intrinsic factor deficiency: Secondary | ICD-10-CM

## 2012-07-01 MED ORDER — CYANOCOBALAMIN 1000 MCG/ML IJ SOLN
1000.0000 ug | Freq: Once | INTRAMUSCULAR | Status: AC
  Administered 2012-07-01: 1000 ug via INTRAMUSCULAR

## 2012-07-23 ENCOUNTER — Ambulatory Visit: Payer: BC Managed Care – PPO | Attending: Gynecologic Oncology | Admitting: Gynecologic Oncology

## 2012-07-23 ENCOUNTER — Encounter: Payer: Self-pay | Admitting: Gynecologic Oncology

## 2012-07-23 ENCOUNTER — Ambulatory Visit (HOSPITAL_BASED_OUTPATIENT_CLINIC_OR_DEPARTMENT_OTHER): Payer: BC Managed Care – PPO | Admitting: Lab

## 2012-07-23 VITALS — BP 110/76 | HR 66 | Temp 97.4°F | Resp 18 | Ht 67.72 in | Wt 246.0 lb

## 2012-07-23 DIAGNOSIS — Z79899 Other long term (current) drug therapy: Secondary | ICD-10-CM | POA: Insufficient documentation

## 2012-07-23 DIAGNOSIS — Z9079 Acquired absence of other genital organ(s): Secondary | ICD-10-CM | POA: Insufficient documentation

## 2012-07-23 DIAGNOSIS — C569 Malignant neoplasm of unspecified ovary: Secondary | ICD-10-CM

## 2012-07-23 DIAGNOSIS — Z09 Encounter for follow-up examination after completed treatment for conditions other than malignant neoplasm: Secondary | ICD-10-CM | POA: Insufficient documentation

## 2012-07-23 DIAGNOSIS — Z90710 Acquired absence of both cervix and uterus: Secondary | ICD-10-CM | POA: Insufficient documentation

## 2012-07-23 DIAGNOSIS — D510 Vitamin B12 deficiency anemia due to intrinsic factor deficiency: Secondary | ICD-10-CM

## 2012-07-23 DIAGNOSIS — I10 Essential (primary) hypertension: Secondary | ICD-10-CM | POA: Insufficient documentation

## 2012-07-23 NOTE — Patient Instructions (Addendum)
We will contact you with the results of your CA 125 level drawn today.  Follow up with GYN Oncology in six months.  Symptoms to report to your health care team include abdominal distention, feeling full easily, new and persistent nausea and vomiting, bloating, vaginal bleeding, rectal bleeding, weight loss without effort, new and persistent pain or fatigue, new masses, new and persistent cough, and any other concerns. If what you are feeling is urgent, and you cannot get an appointment with your regular health care team, go to an Urgent Care or Medical Walk-In clinic.   Thank you for coming to see me today.  I appreciate your confidence in choosing [facility] for your medical care.  If you have any questions about your visit today, please call our office and we will get back to you as soon as possible.  Warner Mccreedy, NP Gynecologic Oncology

## 2012-07-23 NOTE — Progress Notes (Signed)
Consult Note: Gyn-Onc  [name] 46 y.o. female  CC:  Chief Complaint  Patient presents with  . LMP Tumor    Follow up    HPI:  [name] is a very pleasant 46 year old female who underwent an exploratory laparotomy, total abdominal hysterectomy, bilateral salpingo-oophorectomy in March of 2009 due to the presence of a large abdominal mass.  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  Her CA 125 level was 194 at the time of diagnosis and has remained normal since then.  Her last visit in June of 2013 was unremarkable with a CA 125 level of 16.1.  She comes in today for followup. She is essentially without complaints.  Interval History:  She presents today for continued follow up.  No complaints voiced since last visit.  She states that she has been trying to lose weight by watching what she eats and walking several times a week.   Review of Systems:   Denies chest pain, shortness of breath, nausea, vomiting, fevers, chills, headaches, visual changes, unintentional weight loss or weight gain.  Denies any change in bowel or bladder habits, vaginal bleeding, or rectal bleeding.  Feeling well. She is up-to-date on her mammograms the last mammogram being in May 2013 with normal results. Otherwise 10 point ROS negative.   Current Meds:  Outpatient Encounter Prescriptions as of 07/23/2012  Medication Sig Dispense Refill  . Chlorphen-Pyril-Phenyleph (TRIPLEX AD PO) Take 1 tablet by mouth daily.       . cholecalciferol (VITAMIN D) 1000 UNITS tablet Take 1,000 Units by mouth daily.        [name] levothyroxine (SYNTHROID, LEVOTHROID) 50 MCG tablet TAKE 1 TABLET BY MOUTH DAILY  90 tablet  0  . lisinopril-hydrochlorothiazide (PRINZIDE,ZESTORETIC) 10-12.5 MG per tablet Take 1 tablet by mouth daily.  90 tablet  2  . Multiple Vitamin (MULTIVITAMIN) tablet Take 1 tablet by mouth daily.        . sertraline (ZOLOFT) 50 MG tablet Take 1 tablet (50 mg total)  by mouth daily.  90 tablet  2  . vitamin C (ASCORBIC ACID) 500 MG tablet Take 500 mg by mouth daily.          Allergy: No Known Allergies  Social Hx:   History   Social History  . Marital Status: Single    Spouse Name: N/A    Number of Children: N/A  . Years of Education: N/A   Occupational History  . Not on file.   Social History Main Topics  . Smoking status: Never Smoker   . Smokeless tobacco: Not on file  . Alcohol Use: No  . Drug Use: Not on file  . Sexually Active: No   Other Topics Concern  . Not on file   Social History Narrative  . No narrative on file    Past Surgical Hx:  Past Surgical History  Procedure Date  . Abdominal hysterectomy 2009    TAHBSO    Past Medical Hx:  Past Medical History  Diagnosis Date  . Anemia   . Hypertension   . Anxiety   . mucinous LMP tumor 06/18/2011  . Pernicious anemia 07/02/2011    Family Hx:  Family History  Problem Relation Age of Onset  . Adopted: Yes    Vitals:  Blood pressure 110/76, pulse 66, temperature 97.4 F (36.3 C), temperature source Oral, resp. rate 18, height 5' 7.72" (1.72 m), weight 246 lb (111.585 kg).  Physical Exam:  GENERAL:  Well-nourished, well-developed female, in no acute distress.  Alert, oriented x 3. NECK: Supple. There is no lymphadenopathy or thyromegaly.  LUNGS: Clear to auscultation bilaterally.  CARDIOVASCULAR EXAM: Regular rate and rhythm.  ABDOMEN: Abdomen obese, soft, nontender, and nondistended.  No palpable masses or hepatosplenomegaly but exam limited by habitus. Groins: negative for adenopathy.  EXTREMITIES: 2+ non-pitting edema, equal bilaterally.  PELVIC: External genitalia is within normal limits. Vagina without lesions, bleeding, or drainage.  Bimanual examination reveals no masses or nodularity. Rectal confirms. Exam is  somewhat limited by habitus.  RECTAL: Good tone.  No masses  Assessment/Plan:  [name] is a 46 year old with a stage IA mucinous  low-malignant potential tumor of the ovary.  She was diagnosed in March of 2009 and treated >4 years ago.  She remains with no clinical evidence of recurrent disease.  Plan to follow up on results of her CA-125 drawn today with plans for follow up with GYN Oncology in six months.  Reportable signs and symptoms reviewed.    CROSS, Raiza DEAL, NP 07/23/2012, 2:01 PM

## 2012-07-24 ENCOUNTER — Telehealth: Payer: Self-pay | Admitting: Gynecologic Oncology

## 2012-07-24 NOTE — Telephone Encounter (Signed)
Patient notified of CA 125 results of 15.1.  No questions or concerns voiced.  Instructed to call for any needs.

## 2012-07-30 ENCOUNTER — Ambulatory Visit (INDEPENDENT_AMBULATORY_CARE_PROVIDER_SITE_OTHER): Payer: BC Managed Care – PPO | Admitting: Family Medicine

## 2012-07-30 ENCOUNTER — Encounter: Payer: Self-pay | Admitting: Family Medicine

## 2012-07-30 VITALS — BP 108/82 | HR 81 | Temp 98.1°F | Resp 16 | Ht 67.0 in | Wt 246.6 lb

## 2012-07-30 DIAGNOSIS — I10 Essential (primary) hypertension: Secondary | ICD-10-CM | POA: Insufficient documentation

## 2012-07-30 DIAGNOSIS — F329 Major depressive disorder, single episode, unspecified: Secondary | ICD-10-CM

## 2012-07-30 DIAGNOSIS — F325 Major depressive disorder, single episode, in full remission: Secondary | ICD-10-CM | POA: Insufficient documentation

## 2012-07-30 DIAGNOSIS — E039 Hypothyroidism, unspecified: Secondary | ICD-10-CM

## 2012-07-30 DIAGNOSIS — F32A Depression, unspecified: Secondary | ICD-10-CM

## 2012-07-30 MED ORDER — SERTRALINE HCL 50 MG PO TABS: 50.0000 mg | ORAL_TABLET | Freq: Every day | ORAL | Status: DC

## 2012-07-30 MED ORDER — CHOLINE FENOFIBRATE 135 MG PO CPDR: 135.0000 mg | DELAYED_RELEASE_CAPSULE | Freq: Every day | ORAL | Status: DC

## 2012-07-30 MED ORDER — LEVOTHYROXINE SODIUM 50 MCG PO TABS: 50.0000 ug | ORAL_TABLET | Freq: Every day | ORAL | Status: DC

## 2012-07-30 MED ORDER — LISINOPRIL-HYDROCHLOROTHIAZIDE 10-12.5 MG PO TABS: 1.0000 | ORAL_TABLET | Freq: Every day | ORAL | Status: DC

## 2012-07-30 NOTE — Patient Instructions (Addendum)
All your medications have been refilled. You will be scheduled for CPE - 4 months.

## 2012-07-30 NOTE — Progress Notes (Signed)
S:  This 46 y.o. Cauc female returns for HTN recheck and medication refills. She has no adverse effects w/ BP medication and is asymptomatic. She feels good on current dose of anti-depressant and is compliant w/ thyroid medication. She was  diagnosed w/ hypothyroidism ~ 4 years ago and current dose of medication has remained unchanged for at least 1 year. She has B12 deficiency which is treated w/ B12 injections by Dr. Myna Hidalgo.  ROS: Negative for fatigue. Unexplained weight change, CP or tightness, palpitations, SOB or DOE, edema, HA, dizziness, weakness, numbness or syncope. Depressive symptoms controlled- no SI/HI.  O:  Filed Vitals:   07/30/12 0841  BP: 108/82  Pulse: 81  Temp: 98.1 F (36.7 C)  Resp: 16   GEN: In NAD; WN,WD. HENT: [place]/AT; EOMI w/ clear conj/ scl. Otherwise unremarkable. COR: RRR. LUNGS: Normal resp rate and effort. NEURO: A&O x 3; CNs intact. Nonfocal.  A/P:  1. HTN (hypertension)  lisinopril-hydrochlorothiazide (PRINZIDE,ZESTORETIC) 10-12.5 MG per tablet  2. Unspecified hypothyroidism  Continue current medication dose- Levothyroxine 50 mcg  1 tab daily  3. Depression  sertraline (ZOLOFT) 50 MG tablet   RTC for CPE and fasting labs in 4 months.

## 2012-09-23 ENCOUNTER — Ambulatory Visit (HOSPITAL_BASED_OUTPATIENT_CLINIC_OR_DEPARTMENT_OTHER): Payer: BC Managed Care – PPO

## 2012-09-23 VITALS — BP 115/75 | HR 77 | Temp 97.1°F

## 2012-09-23 DIAGNOSIS — D510 Vitamin B12 deficiency anemia due to intrinsic factor deficiency: Secondary | ICD-10-CM

## 2012-09-23 MED ORDER — CYANOCOBALAMIN 1000 MCG/ML IJ SOLN
1000.0000 ug | Freq: Once | INTRAMUSCULAR | Status: AC
  Administered 2012-09-23: 1000 ug via INTRAMUSCULAR

## 2012-12-03 ENCOUNTER — Encounter: Payer: Self-pay | Admitting: Family Medicine

## 2012-12-03 ENCOUNTER — Ambulatory Visit (INDEPENDENT_AMBULATORY_CARE_PROVIDER_SITE_OTHER): Payer: BC Managed Care – PPO | Admitting: Family Medicine

## 2012-12-03 VITALS — BP 106/82 | HR 77 | Temp 97.8°F | Resp 16 | Ht 67.0 in | Wt 248.8 lb

## 2012-12-03 DIAGNOSIS — E786 Lipoprotein deficiency: Secondary | ICD-10-CM

## 2012-12-03 DIAGNOSIS — E039 Hypothyroidism, unspecified: Secondary | ICD-10-CM

## 2012-12-03 DIAGNOSIS — Z1159 Encounter for screening for other viral diseases: Secondary | ICD-10-CM

## 2012-12-03 DIAGNOSIS — I10 Essential (primary) hypertension: Secondary | ICD-10-CM

## 2012-12-03 DIAGNOSIS — F32A Depression, unspecified: Secondary | ICD-10-CM

## 2012-12-03 DIAGNOSIS — F329 Major depressive disorder, single episode, unspecified: Secondary | ICD-10-CM

## 2012-12-03 DIAGNOSIS — Z6841 Body Mass Index (BMI) 40.0 and over, adult: Secondary | ICD-10-CM | POA: Insufficient documentation

## 2012-12-03 DIAGNOSIS — Z0000 Encounter for general adult medical examination without abnormal findings: Secondary | ICD-10-CM

## 2012-12-03 LAB — BASIC METABOLIC PANEL
BUN: 18 mg/dL (ref 6–23)
Chloride: 103 mEq/L (ref 96–112)
Glucose, Bld: 116 mg/dL — ABNORMAL HIGH (ref 70–99)
Potassium: 4 mEq/L (ref 3.5–5.3)
Sodium: 136 mEq/L (ref 135–145)

## 2012-12-03 LAB — TSH: TSH: 5.088 u[IU]/mL — ABNORMAL HIGH (ref 0.350–4.500)

## 2012-12-03 LAB — T4, FREE: Free T4: 1.11 ng/dL (ref 0.80–1.80)

## 2012-12-03 LAB — LIPID PANEL: Cholesterol: 160 mg/dL (ref 0–200)

## 2012-12-03 MED ORDER — LEVOTHYROXINE SODIUM 50 MCG PO TABS: 50.0000 ug | ORAL_TABLET | Freq: Every day | ORAL | Status: DC

## 2012-12-03 MED ORDER — SERTRALINE HCL 50 MG PO TABS: 50.0000 mg | ORAL_TABLET | Freq: Every day | ORAL | Status: DC

## 2012-12-03 MED ORDER — CHOLINE FENOFIBRATE 135 MG PO CPDR: 135.0000 mg | DELAYED_RELEASE_CAPSULE | Freq: Every day | ORAL | Status: DC

## 2012-12-03 MED ORDER — LISINOPRIL-HYDROCHLOROTHIAZIDE 10-12.5 MG PO TABS: 1.0000 | ORAL_TABLET | Freq: Every day | ORAL | Status: DC

## 2012-12-03 NOTE — Patient Instructions (Signed)
All your medications have been refilled for 1 year. You need to return for follow-up in 6 months. If any of your labs require medication dose changes, you will be notified. Focus on good nutrition and staying active for weight reduction.   Exercise to Lose Weight Exercise and a healthy diet may help you lose weight. Your doctor may suggest specific exercises. EXERCISE IDEAS AND TIPS  Choose low-cost things you enjoy doing, such as walking, bicycling, or exercising to workout videos.  Take stairs instead of the elevator.  Walk during your lunch break.  Park your car further away from work or school.  Go to a gym or an exercise class.  Start with 5 to 10 minutes of exercise each day. Build up to 30 minutes of exercise 4 to 6 days a week.  Wear shoes with good support and comfortable clothes.  Stretch before and after working out.  Work out until you breathe harder and your heart beats faster.  Drink extra water when you exercise.  Do not do so much that you hurt yourself, feel dizzy, or get very short of breath. Exercises that burn about 150 calories:  Running 1  miles in 15 minutes.  Playing volleyball for 45 to 60 minutes.  Washing and waxing a car for 45 to 60 minutes.  Playing touch football for 45 minutes.  Walking 1  miles in 35 minutes.  Pushing a stroller 1  miles in 30 minutes.  Playing basketball for 30 minutes.  Raking leaves for 30 minutes.  Bicycling 5 miles in 30 minutes.  Walking 2 miles in 30 minutes.  Dancing for 30 minutes.  Shoveling snow for 15 minutes.  Swimming laps for 20 minutes.  Walking up stairs for 15 minutes.  Bicycling 4 miles in 15 minutes.  Gardening for 30 to 45 minutes.  Jumping rope for 15 minutes.  Washing windows or floors for 45 to 60 minutes. Document Released: 09/01/2010 Document Revised: 10/22/2011 Document Reviewed: 09/01/2010 [facility] Patient Information 2013 [place], [place].

## 2012-12-03 NOTE — Progress Notes (Signed)
  Subjective:    Patient ID: [name], female    DOB: 06-20-1966, 47 y.o.   MRN: [id number]  HPI  This 47 y.o. Cauc female is here for CPE; GYN exam is done at [facility].  Pt feels well and is compliant w/ medications; she reports no adverse effects. Pt has changed  nutrition (eleiminated sweets) and started a regular exercise routine.   MMG: sch per pt.    PMHx, Soc Hx and Fam Hx reviewed.   Review of Systems  Constitutional: Negative.   HENT: Negative.   Eyes: Negative.   Respiratory: Negative.   Cardiovascular: Negative.   Gastrointestinal: Negative.   Endocrine: Negative.   Genitourinary: Negative.   Musculoskeletal: Negative.   Skin: Negative.   Allergic/Immunologic: Negative.   Neurological: Negative.   Hematological: Negative.   Psychiatric/Behavioral: Negative.        Objective:   Physical Exam  Constitutional: She is oriented to person, place, and time. Vital signs are normal. She appears well-developed and well-nourished. No distress.  HENT:  Head: Normocephalic and atraumatic.  Right Ear: Hearing, tympanic membrane, external ear and ear canal normal.  Left Ear: Hearing, tympanic membrane, external ear and ear canal normal.  Nose: Nose normal. No mucosal edema, nasal deformity or septal deviation.  Mouth/Throat: Uvula is midline, oropharynx is clear and moist and mucous membranes are normal. No oral lesions. Normal dentition. No dental caries.  Eyes: Conjunctivae, EOM and lids are normal. Pupils are equal, round, and reactive to light. No scleral icterus.  Pt wears contacts; has routine vision evaluation.  Neck: Normal range of motion. Neck supple. No thyromegaly present.  Cardiovascular: Normal rate, regular rhythm, normal heart sounds and intact distal pulses.  Exam reveals no gallop and no friction rub.   No murmur heard. Pulmonary/Chest: Effort normal and breath sounds normal. No respiratory distress. Right breast exhibits no inverted  nipple, no mass, no nipple discharge, no skin change and no tenderness. Left breast exhibits no inverted nipple, no mass, no nipple discharge, no skin change and no tenderness. Breasts are symmetrical.  Abdominal: Soft. Normal appearance and bowel sounds are normal. She exhibits no distension, no pulsatile midline mass and no mass. There is no hepatosplenomegaly. There is no tenderness. There is no guarding and no CVA tenderness. No hernia.  Musculoskeletal: Normal range of motion. She exhibits no edema and no tenderness.  Lymphadenopathy:    She has no cervical adenopathy.  Neurological: She is alert and oriented to person, place, and time. No cranial nerve deficit. She exhibits normal muscle tone. Coordination normal.  Skin: Skin is warm and dry. No rash noted. No erythema. No pallor.  Psychiatric: She has a normal mood and affect. Her behavior is normal. Judgment and thought content normal.        Assessment & Plan:  Routine general medical examination at a health care facility  HTN (hypertension) - stable; no medication change at this time. Advised home monitoring.  Plan: Basic metabolic panel, lisinopril-hydrochlorothiazide (PRINZIDE,ZESTORETIC) 10-12.5 MG per tablet  Unspecified hypothyroidism - stable and asymptomatic; continue current medication.   Plan: TSH, T4, free  Low HDL (under 40) - June 2013- HDL=32.    Plan: Lipid panel  Depression - stable; continue current dose of Sertraline.   Plan: sertraline (ZOLOFT) 50 MG tablet  Need for hepatitis C screening test - Plan: Hepatitis C antibody

## 2012-12-04 ENCOUNTER — Encounter: Payer: Self-pay | Admitting: Family Medicine

## 2012-12-04 ENCOUNTER — Other Ambulatory Visit: Payer: Self-pay | Admitting: Family Medicine

## 2012-12-04 DIAGNOSIS — R739 Hyperglycemia, unspecified: Secondary | ICD-10-CM

## 2012-12-04 DIAGNOSIS — E039 Hypothyroidism, unspecified: Secondary | ICD-10-CM

## 2012-12-04 MED ORDER — LEVOTHYROXINE SODIUM 75 MCG PO TABS: 75.0000 ug | ORAL_TABLET | Freq: Every day | ORAL | Status: DC

## 2012-12-16 ENCOUNTER — Ambulatory Visit (HOSPITAL_BASED_OUTPATIENT_CLINIC_OR_DEPARTMENT_OTHER): Payer: BC Managed Care – PPO

## 2012-12-16 VITALS — BP 105/76 | HR 79 | Temp 97.8°F

## 2012-12-16 DIAGNOSIS — D510 Vitamin B12 deficiency anemia due to intrinsic factor deficiency: Secondary | ICD-10-CM

## 2012-12-16 MED ORDER — CYANOCOBALAMIN 1000 MCG/ML IJ SOLN
1000.0000 ug | Freq: Once | INTRAMUSCULAR | Status: AC
  Administered 2012-12-16: 1000 ug via INTRAMUSCULAR

## 2013-01-22 ENCOUNTER — Encounter: Payer: Self-pay | Admitting: Gynecologic Oncology

## 2013-01-22 ENCOUNTER — Ambulatory Visit (HOSPITAL_BASED_OUTPATIENT_CLINIC_OR_DEPARTMENT_OTHER): Payer: BC Managed Care – PPO | Admitting: Lab

## 2013-01-22 ENCOUNTER — Ambulatory Visit: Payer: BC Managed Care – PPO | Attending: Gynecologic Oncology | Admitting: Gynecologic Oncology

## 2013-01-22 VITALS — BP 100/70 | HR 66 | Temp 97.5°F | Resp 18 | Ht 67.72 in | Wt 248.8 lb

## 2013-01-22 DIAGNOSIS — C569 Malignant neoplasm of unspecified ovary: Secondary | ICD-10-CM

## 2013-01-22 DIAGNOSIS — D510 Vitamin B12 deficiency anemia due to intrinsic factor deficiency: Secondary | ICD-10-CM

## 2013-01-22 NOTE — Patient Instructions (Signed)
Doing well.  We will contact you with the results of you lab work from today.   Plan to follow up in six months.  Please call for any questions or concerns.

## 2013-01-23 ENCOUNTER — Telehealth: Payer: Self-pay | Admitting: Gynecologic Oncology

## 2013-01-23 NOTE — Progress Notes (Signed)
Consult Note: Gyn-Onc  [name] 47 y.o. female  CC:  Chief Complaint  Patient presents with  . LMP Tumor    Follow up    HPI:  [name] is a very pleasant 47 year old female who underwent an exploratory laparotomy, total abdominal hysterectomy, bilateral salpingo-oophorectomy in March of 2009 due to the presence of a large abdominal mass.  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  Her CA 125 level was 194 at the time of diagnosis and has remained normal since then.  Her last visit in December of 2013 was unremarkable with a CA 125 level of 15.1.    Interval History:  She presents today for continued follow up.  She has been traveling to [address] recently.  No complaints voiced since last visit.  She has continued weight loss efforts by watching what she eats and walking several times a week.  She feels well.  Her last mammogram was in May 2014 and she has not had a colonoscopy in the past.  Her primary care provider is Dr. Dow Adolph and she continues to see Dr. Myna Hidalgo.  Review of Systems:   Constitutional: Feels well.  Cardiovascular: No chest pain, shortness of breath, or edema.  Pulmonary: No cough or wheeze.  Gastrointestinal: No nausea, vomiting, or diarrhea. No bright red blood per rectum or change in bowel movement.  Genitourinary: No frequency, urgency, or dysuria. No vaginal bleeding or discharge.  Musculoskeletal: No myalgia or joint pain. Neurologic: No weakness, numbness, or change in gait.  Psychology: No depression, anxiety, or insomnia.  Current Meds:  Outpatient Encounter Prescriptions as of 01/22/2013  Medication Sig Dispense Refill  . cholecalciferol (VITAMIN D) 1000 UNITS tablet Take 1,000 Units by mouth daily.        . Choline Fenofibrate 135 MG capsule Take 1 capsule (135 mg total) by mouth daily.  90 capsule  3  . levothyroxine (SYNTHROID, LEVOTHROID) 75 MCG tablet Take 1 tablet (75 mcg total) by  mouth daily.  90 tablet  3  . lisinopril-hydrochlorothiazide (PRINZIDE,ZESTORETIC) 10-12.5 MG per tablet Take 1 tablet by mouth daily.  90 tablet  3  . Multiple Vitamin (MULTIVITAMIN) tablet Take 1 tablet by mouth daily.        . sertraline (ZOLOFT) 50 MG tablet Take 1 tablet (50 mg total) by mouth daily.  90 tablet  3  . vitamin C (ASCORBIC ACID) 500 MG tablet Take 500 mg by mouth daily.         No facility-administered encounter medications on file as of 01/22/2013.    Allergy: No Known Allergies  Social Hx:   History   Social History  . Marital Status: Single    Spouse Name: N/A    Number of Children: N/A  . Years of Education: N/A   Occupational History  . Unemployed.    Social History Main Topics  . Smoking status: Never Smoker   . Smokeless tobacco: Not on file  . Alcohol Use: No  . Drug Use: No  . Sexually Active: No   Other Topics Concern  . Not on file   Social History Narrative   Single. Education: [employer]. Exercise: Walks 1-3 times a week for 30 minutes.    Past Surgical Hx:  Past Surgical History  Procedure Laterality Date  . Abdominal hysterectomy  2009    TAHBSO    Past Medical Hx:  Past Medical History  Diagnosis Date  . Anemia   .  Hypertension   . Anxiety   . mucinous LMP tumor 06/18/2011  . Pernicious anemia 07/02/2011    Family Hx:  Family History  Problem Relation Age of Onset  . Adopted: Yes    Vitals:  Blood pressure 100/70, pulse 66, temperature 97.5 F (36.4 C), temperature source Oral, resp. rate 18, height 5' 7.72" (1.72 m), weight 248 lb 12.8 oz (112.855 kg).  Physical Exam:  General: Well developed, well nourished female in no acute distress. Alert and oriented x 3.  Neck: Supple without any enlargements.  Lymph node survey: No cervical, supraclavicular, or inguinal adenopathy  Cardiovascular: Regular rate and rhythm. S1 and S2 normal.  Lungs: Clear to auscultation bilaterally. No wheezes/crackles/rhonchi noted.  Skin:  No rashes or lesions present. Back: No CVA tenderness.  Abdomen: Abdomen soft, non-tender and obese. Active bowel sounds in all quadrants. No evidence of a fluid wave or abdominal masses.  Genitourinary:    Vulva/vagina: Normal external female genitalia. No lesions.    Urethra: No lesions or masses.    Vagina: Mildly atrophic without any lesions. No palpable masses. No vaginal bleeding or drainage noted.  Rectal: Good tone, no masses, no cul de sac nodularity.  Extremities: No bilateral cyanosis, edema or clubbing.   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  She was diagnosed in March of 2009 and treated >4 years ago.  She remains with no clinical evidence of recurrent disease.  Plan to follow up on results of her CA-125 drawn today with plans for follow up with GYN Oncology in six months.  Reportable signs and symptoms reviewed.  She is advised to call for any questions or concerns.   CROSS, [name] DEAL, NP 01/23/2013, 3:42 PM

## 2013-01-23 NOTE — Telephone Encounter (Signed)
Patient informed of CA 125 results: normal.  Instructed to call for any questions or concerns.

## 2013-03-30 ENCOUNTER — Ambulatory Visit (HOSPITAL_BASED_OUTPATIENT_CLINIC_OR_DEPARTMENT_OTHER): Payer: BC Managed Care – PPO | Admitting: Hematology & Oncology

## 2013-03-30 ENCOUNTER — Ambulatory Visit (HOSPITAL_BASED_OUTPATIENT_CLINIC_OR_DEPARTMENT_OTHER): Payer: BC Managed Care – PPO

## 2013-03-30 ENCOUNTER — Other Ambulatory Visit (HOSPITAL_BASED_OUTPATIENT_CLINIC_OR_DEPARTMENT_OTHER): Payer: BC Managed Care – PPO | Admitting: Lab

## 2013-03-30 VITALS — BP 115/75 | HR 72 | Temp 97.9°F | Resp 16 | Ht 67.0 in | Wt 252.0 lb

## 2013-03-30 DIAGNOSIS — D510 Vitamin B12 deficiency anemia due to intrinsic factor deficiency: Secondary | ICD-10-CM

## 2013-03-30 LAB — CBC WITH DIFFERENTIAL (CANCER CENTER ONLY)
BASO%: 0.7 % (ref 0.0–2.0)
EOS%: 2 % (ref 0.0–7.0)
LYMPH%: 29.4 % (ref 14.0–48.0)
MCH: 28.9 pg (ref 26.0–34.0)
MCHC: 33.8 g/dL (ref 32.0–36.0)
MCV: 86 fL (ref 81–101)
MONO%: 8.1 % (ref 0.0–13.0)
Platelets: 193 10*3/uL (ref 145–400)
RDW: 13.7 % (ref 11.1–15.7)

## 2013-03-30 LAB — CHCC SATELLITE - SMEAR

## 2013-03-30 MED ORDER — CYANOCOBALAMIN 1000 MCG/ML IJ SOLN
1000.0000 ug | Freq: Once | INTRAMUSCULAR | Status: AC
  Administered 2013-03-30: 1000 ug via INTRAMUSCULAR

## 2013-03-30 NOTE — Progress Notes (Signed)
This office note has been dictated.

## 2013-03-30 NOTE — Patient Instructions (Signed)

## 2013-03-31 NOTE — Progress Notes (Signed)
CC:   Maurice March, M.D.  DIAGNOSIS:  Pernicious anemia.  CURRENT THERAPY:  The patient to have vitamin B12 one mg IM q.6 months.  INTERIM HISTORY:  Ms. Wartman comes in for followup.  We see her once a year.  She has been getting the vitamin B12 every 3 months.  I think that we can probably go to every 6 months for the vitamin B12.  She has had no problems since we last saw her.  Her mother did have bowel surgery out at [facility].  Ms. Knieriem has been doing okay.  She has had no problems with nausea or vomiting.  She has had no fever, sweats, or chills.  There has been no tingling in the hands or feet.  She has had no change in bowel or bladder habits.  When we saw her a year ago, her B12 level was 367.  PHYSICAL EXAM:  General:  This is a well-developed, well-nourished white female in no obvious distress.  Vital Signs:  Temperature of 97.9, pulse 72, respiratory rate 16, blood pressure 115/75.  Weight is 252.  Head and Neck:  Normocephalic, atraumatic skull.  There are no ocular or oral lesions.  There are no palpable cervical or supraclavicular lymph nodes. Lungs:  Clear bilaterally.  Cardiac:  Regular rate and rhythm with a normal S1, S2.  There are no murmurs, rubs or bruits.  Abdomen:  Soft. She has good bowel sounds.  There is no fluid wave.  There is no palpable hepatosplenomegaly.  Extremities:  No clubbing, cyanosis or edema.  Neurological:  No focal neurological deficits.  LABORATORY STUDIES:  White cell count 5.6, hemoglobin 14.6, hematocrit 43.2, platelet count 193.  IMPRESSION:  Ms. Benjamin is a very charming 47 year old white female with pernicious anemia.  She has antiparietal cell antibodies.  We have been seeing her now for about 14 years.  We will go ahead and give her the B12 today.  Otherwise, I think we can have her come back in 6 months just for B12 injection.  I will plan to see her back myself in 1  year.    ______________________________ Josph Macho, M.D. PRE/MEDQ  D:  03/30/2013  T:  03/31/2013  Job:  [id number]

## 2013-06-04 ENCOUNTER — Encounter: Payer: Self-pay | Admitting: Family Medicine

## 2013-06-04 ENCOUNTER — Ambulatory Visit (INDEPENDENT_AMBULATORY_CARE_PROVIDER_SITE_OTHER): Payer: BC Managed Care – PPO | Admitting: Family Medicine

## 2013-06-04 VITALS — BP 120/90 | HR 66 | Temp 98.0°F | Resp 16 | Ht 67.0 in | Wt 249.0 lb

## 2013-06-04 DIAGNOSIS — E669 Obesity, unspecified: Secondary | ICD-10-CM

## 2013-06-04 DIAGNOSIS — I10 Essential (primary) hypertension: Secondary | ICD-10-CM

## 2013-06-04 DIAGNOSIS — E039 Hypothyroidism, unspecified: Secondary | ICD-10-CM

## 2013-06-04 LAB — T3, FREE: T3, Free: 3.3 pg/mL (ref 2.3–4.2)

## 2013-06-04 LAB — COMPREHENSIVE METABOLIC PANEL
ALT: 36 U/L — ABNORMAL HIGH (ref 0–35)
AST: 29 U/L (ref 0–37)
CO2: 24 mEq/L (ref 19–32)
Creat: 0.82 mg/dL (ref 0.50–1.10)
Total Bilirubin: 0.4 mg/dL (ref 0.3–1.2)

## 2013-06-04 LAB — LIPID PANEL
Cholesterol: 155 mg/dL (ref 0–200)
Total CHOL/HDL Ratio: 5.5 Ratio
VLDL: 37 mg/dL (ref 0–40)

## 2013-06-04 NOTE — Progress Notes (Signed)
S:  This 47 y. o. Cauc female has well controlled HTN; BP at home= 106-110/70. Medication compliance is excellent; no adverse effects reported. Pt denies fatigue, diaphoresis, CP or tightness, palpitations, edema, HA, dizziness, lightheadedness, numbness or weakness. She and a friend walk 3 miles every day; she has lost some inches but no weight which is discouraging for her.  Patient Active Problem List   Diagnosis Date Noted  . Obesity, unspecified 12/03/2012  . HTN (hypertension) 07/30/2012  . Unspecified hypothyroidism 07/30/2012  . Depression 07/30/2012  . Pernicious anemia 07/02/2011  . mucinous LMP tumor 05/03/2011   PMHx, Soc Hx and Fam Hx reviewed. Medications reconciled.  ROS: As per HPI.  O: Filed Vitals:   06/04/13 0919  BP: 120/90  Pulse: 66  Temp: 98 F (36.7 C)  Resp: 16   GEN: in NAD: WN,WD. HENT: Spring/AT; EOMI w/ clear conj/sclerae. Otherwise unremarkable. COR: RRR. LUNGS: Normal resp rate and effort. SKIN: W&D; intact w/o erythema, rashes or pallor. MS: MAEs; no c/c/e. NEURO: A&O x 3; Cns intact. Nonfocal.  A/P: Unspecified hypothyroidism - Stable; continue current medication pending lab results .Plan: TSH, T3, free  HTN (hypertension) -  Stable; no medication change at this time. Plan: Comprehensive metabolic panel, Lipid panel  Obesity, unspecified - Continue fitness program and nutrition changes. Focus on benefits to cardiovascular system and overall well-being; expect desired results in long run.  Plan: Lipid panel

## 2013-06-05 ENCOUNTER — Encounter: Payer: Self-pay | Admitting: Family Medicine

## 2013-07-10 ENCOUNTER — Encounter: Payer: Self-pay | Admitting: Family Medicine

## 2013-07-10 ENCOUNTER — Telehealth: Payer: Self-pay | Admitting: Radiology

## 2013-07-10 NOTE — Telephone Encounter (Signed)
Your message to patient indicates you will advise on meds, please advise.

## 2013-07-16 ENCOUNTER — Ambulatory Visit: Payer: BC Managed Care – PPO | Attending: Gynecologic Oncology | Admitting: Gynecologic Oncology

## 2013-07-16 ENCOUNTER — Encounter: Payer: Self-pay | Admitting: Gynecologic Oncology

## 2013-07-16 ENCOUNTER — Ambulatory Visit (HOSPITAL_BASED_OUTPATIENT_CLINIC_OR_DEPARTMENT_OTHER): Payer: BC Managed Care – PPO | Admitting: Lab

## 2013-07-16 VITALS — BP 149/96 | HR 78 | Temp 97.4°F | Resp 16 | Ht 67.72 in | Wt 248.0 lb

## 2013-07-16 DIAGNOSIS — D3910 Neoplasm of uncertain behavior of unspecified ovary: Secondary | ICD-10-CM

## 2013-07-16 DIAGNOSIS — D4959 Neoplasm of unspecified behavior of other genitourinary organ: Secondary | ICD-10-CM

## 2013-07-16 NOTE — Patient Instructions (Signed)
Doing great.  Plan to follow up with your primary care provider and GYN Oncology as needed.  We will contact you with the results of your CA 125 from today.

## 2013-07-16 NOTE — Progress Notes (Signed)
Follow Up Note: Gyn-Onc  [name] 47 y.o. female  CC:  Chief Complaint  Patient presents with  . LMP Tumor    Follow up    HPI:  [name] is a very pleasant 47 year old female who underwent an exploratory laparotomy, total abdominal hysterectomy, bilateral salpingo-oophorectomy in March of 2009 due to the presence of a large abdominal mass.  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  Her CA 125 level was 194 at the time of diagnosis and has remained normal since then.  Her last visit in June of 2014 was unremarkable with a CA 125 level of 13.2.    Interval History:  She presents today for continued follow up.  She celebrated Thanksgiving and Christmas with her family since her sister will be traveling to [place] the day after Christmas.  No complaints voiced since last visit.  She has continued weight loss efforts by watching what she eats and walking several times a week.  She feels well.  Her last mammogram was in May 2014 and she has not had a colonoscopy in the past.  Her primary care provider is Dr. Dow Adolph and she continues to see Dr. Myna Hidalgo.  Review of Systems:   Constitutional: Feels well.  No fever, chills, early satiety, or unintentional change in weight.  Cardiovascular: No chest pain, shortness of breath, or edema.  Pulmonary: No cough or wheeze.  Gastrointestinal: No nausea, vomiting, or diarrhea. No bright red blood per rectum or change in bowel movement.  Genitourinary: No frequency, urgency, or dysuria. No vaginal bleeding or discharge.  Musculoskeletal: No myalgia or joint pain. Neurologic: No weakness, numbness, or change in gait.  Psychology: No depression, anxiety, or insomnia.  Current Meds:  Outpatient Encounter Prescriptions as of 07/16/2013  Medication Sig  . cholecalciferol (VITAMIN D) 1000 UNITS tablet Take 1,000 Units by mouth daily.    . Choline Fenofibrate 135 MG capsule Take 1 capsule (135  mg total) by mouth daily.  [name] levothyroxine (SYNTHROID, LEVOTHROID) 75 MCG tablet Take 1 tablet (75 mcg total) by mouth daily.  [name] lisinopril-hydrochlorothiazide (PRINZIDE,ZESTORETIC) 10-12.5 MG per tablet Take 1 tablet by mouth daily.  . Multiple Vitamin (MULTIVITAMIN) tablet Take 1 tablet by mouth daily.    . Omega-3 Fatty Acids (FISH OIL) 1000 MG CAPS Take by mouth daily.  . sertraline (ZOLOFT) 50 MG tablet Take 1 tablet (50 mg total) by mouth daily.  . vitamin C (ASCORBIC ACID) 500 MG tablet Take 500 mg by mouth daily.      Allergy: No Known Allergies  Social Hx:   History   Social History  . Marital Status: Single    Spouse Name: N/A    Number of Children: N/A  . Years of Education: N/A   Occupational History  . Unemployed.    Social History Main Topics  . Smoking status: Never Smoker   . Smokeless tobacco: Not on file  . Alcohol Use: No  . Drug Use: No  . Sexual Activity: No   Other Topics Concern  . Not on file   Social History Narrative   Single. Education: [employer]. Exercise: Walks 1-3 times a week for 30 minutes.    Past Surgical Hx:  Past Surgical History  Procedure Laterality Date  . Abdominal hysterectomy  2009    TAHBSO    Past Medical Hx:  Past Medical History  Diagnosis Date  . Anemia   . Hypertension   . Anxiety   .  mucinous LMP tumor 06/18/2011  . Pernicious anemia 07/02/2011    Family Hx:  Family History  Problem Relation Age of Onset  . Adopted: Yes    Vitals:  Blood pressure 149/96, pulse 78, temperature 97.4 F (36.3 C), temperature source Oral, resp. rate 16, height 5' 7.72" (1.72 m), weight 248 lb (112.492 kg).  Physical Exam:  General: Well developed, well nourished female in no acute distress. Alert and oriented x 3.  Head/Neck: Oropharynx clear.  Sclerae anicteric.  Supple without any enlargements.  Lymph node survey: No cervical, supraclavicular, or inguinal adenopathy.  Cardiovascular: Regular rate and rhythm. S1 and S2  normal.  Lungs: Clear to auscultation bilaterally. No wheezes/crackles/rhonchi noted.  Skin: No rashes or lesions present. Back: No CVA tenderness.  Abdomen: Abdomen soft, non-tender and obese. Active bowel sounds in all quadrants. No evidence of a fluid wave or abdominal masses.  Genitourinary:    Vulva/vagina: Normal external female genitalia. No lesions.    Urethra: No lesions or masses.    Vagina: Mildly atrophic without any lesions. No palpable masses. No vaginal bleeding or drainage noted.  Rectal: Good tone, no masses, no cul de sac nodularity.  Extremities: No bilateral cyanosis, edema or clubbing.   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  She was diagnosed in March of 2009 and treated >5 years ago.  She remains with no clinical evidence of recurrent disease.  Plan to follow up on results of her CA-125 drawn today.  She is advised to follow up with her primary care provider for future care and GYN Oncology in the future as needed.  Reportable signs and symptoms reviewed.  She is advised to call for any questions or concerns.   [name], [name] DEAL, NP 07/16/2013, 10:06 AM

## 2013-07-17 LAB — CA 125: CA 125: 21.3 U/mL (ref 0.0–30.2)

## 2013-07-21 ENCOUNTER — Telehealth: Payer: Self-pay | Admitting: Gynecologic Oncology

## 2013-07-21 NOTE — Telephone Encounter (Signed)
CA 125 results reviewed with Dr. Duard Brady.  Message left with CA 125 results.  Instructed to please call the office with any questions or concerns.

## 2013-09-07 ENCOUNTER — Encounter: Payer: Self-pay | Admitting: Family Medicine

## 2013-09-07 DIAGNOSIS — F329 Major depressive disorder, single episode, unspecified: Secondary | ICD-10-CM

## 2013-09-07 DIAGNOSIS — I10 Essential (primary) hypertension: Secondary | ICD-10-CM

## 2013-09-07 DIAGNOSIS — F32A Depression, unspecified: Secondary | ICD-10-CM

## 2013-09-09 ENCOUNTER — Other Ambulatory Visit: Payer: Self-pay | Admitting: Family Medicine

## 2013-09-09 ENCOUNTER — Other Ambulatory Visit: Payer: Self-pay | Admitting: Radiology

## 2013-09-09 DIAGNOSIS — F32A Depression, unspecified: Secondary | ICD-10-CM

## 2013-09-09 DIAGNOSIS — I10 Essential (primary) hypertension: Secondary | ICD-10-CM

## 2013-09-09 DIAGNOSIS — F329 Major depressive disorder, single episode, unspecified: Secondary | ICD-10-CM

## 2013-09-09 DIAGNOSIS — E538 Deficiency of other specified B group vitamins: Secondary | ICD-10-CM

## 2013-09-09 MED ORDER — LEVOTHYROXINE SODIUM 75 MCG PO TABS: 75.0000 ug | ORAL_TABLET | Freq: Every day | ORAL | Status: DC

## 2013-09-09 MED ORDER — SERTRALINE HCL 50 MG PO TABS: 50.0000 mg | ORAL_TABLET | Freq: Every day | ORAL | Status: DC

## 2013-09-09 MED ORDER — CHOLINE FENOFIBRATE 135 MG PO CPDR: 135.0000 mg | DELAYED_RELEASE_CAPSULE | Freq: Every day | ORAL | Status: DC

## 2013-09-09 MED ORDER — LISINOPRIL-HYDROCHLOROTHIAZIDE 10-12.5 MG PO TABS: 1.0000 | ORAL_TABLET | Freq: Every day | ORAL | Status: DC

## 2013-09-09 NOTE — Telephone Encounter (Signed)
All meds authorized 90 days w/ 3 refills.

## 2013-09-09 NOTE — Telephone Encounter (Signed)
Pended, please advise

## 2013-09-28 ENCOUNTER — Ambulatory Visit (HOSPITAL_BASED_OUTPATIENT_CLINIC_OR_DEPARTMENT_OTHER): Payer: 59

## 2013-09-28 ENCOUNTER — Telehealth: Payer: Self-pay | Admitting: Hematology & Oncology

## 2013-09-28 VITALS — BP 147/82 | HR 77 | Temp 97.2°F

## 2013-09-28 DIAGNOSIS — D510 Vitamin B12 deficiency anemia due to intrinsic factor deficiency: Secondary | ICD-10-CM

## 2013-09-28 MED ORDER — CYANOCOBALAMIN 1000 MCG/ML IJ SOLN
1000.0000 ug | Freq: Once | INTRAMUSCULAR | Status: AC
  Administered 2013-09-28: 1000 ug via INTRAMUSCULAR

## 2013-09-28 NOTE — Telephone Encounter (Signed)
pt called to r/s inj to today...per Thayer Headings ok....done pt aware

## 2013-09-30 ENCOUNTER — Ambulatory Visit: Payer: BC Managed Care – PPO

## 2013-11-11 ENCOUNTER — Other Ambulatory Visit: Payer: Self-pay | Admitting: Family Medicine

## 2013-11-11 ENCOUNTER — Encounter: Payer: Self-pay | Admitting: Family Medicine

## 2013-11-11 MED ORDER — FENOFIBRIC ACID 105 MG PO TABS: 1.0000 | ORAL_TABLET | Freq: Every day | ORAL | Status: DC

## 2013-11-12 ENCOUNTER — Encounter: Payer: Self-pay | Admitting: Family Medicine

## 2013-11-12 ENCOUNTER — Other Ambulatory Visit: Payer: Self-pay | Admitting: Family Medicine

## 2013-11-12 MED ORDER — FENOFIBRIC ACID 45 MG PO CPDR: 1.0000 | DELAYED_RELEASE_CAPSULE | Freq: Every day | ORAL | Status: DC

## 2013-11-13 ENCOUNTER — Other Ambulatory Visit: Payer: Self-pay | Admitting: Family Medicine

## 2013-11-13 MED ORDER — FENOFIBRATE 54 MG PO TABS: 54.0000 mg | ORAL_TABLET | Freq: Every day | ORAL | Status: DC

## 2013-12-10 ENCOUNTER — Ambulatory Visit (INDEPENDENT_AMBULATORY_CARE_PROVIDER_SITE_OTHER): Payer: 59 | Admitting: Family Medicine

## 2013-12-10 ENCOUNTER — Encounter: Payer: Self-pay | Admitting: Family Medicine

## 2013-12-10 VITALS — BP 116/82 | HR 82 | Temp 98.0°F | Resp 16 | Ht 67.0 in | Wt 253.0 lb

## 2013-12-10 DIAGNOSIS — E781 Pure hyperglyceridemia: Secondary | ICD-10-CM

## 2013-12-10 DIAGNOSIS — I10 Essential (primary) hypertension: Secondary | ICD-10-CM

## 2013-12-10 DIAGNOSIS — F32A Depression, unspecified: Secondary | ICD-10-CM

## 2013-12-10 DIAGNOSIS — F3289 Other specified depressive episodes: Secondary | ICD-10-CM

## 2013-12-10 DIAGNOSIS — E039 Hypothyroidism, unspecified: Secondary | ICD-10-CM

## 2013-12-10 DIAGNOSIS — Z0000 Encounter for general adult medical examination without abnormal findings: Secondary | ICD-10-CM

## 2013-12-10 DIAGNOSIS — F329 Major depressive disorder, single episode, unspecified: Secondary | ICD-10-CM

## 2013-12-10 DIAGNOSIS — Z1231 Encounter for screening mammogram for malignant neoplasm of breast: Secondary | ICD-10-CM

## 2013-12-10 LAB — THYROID PANEL WITH TSH
Free Thyroxine Index: 2.8 (ref 1.0–3.9)
T3 Uptake: 24.8 % (ref 22.5–37.0)
T4, Total: 11.3 ug/dL (ref 5.0–12.5)
TSH: 3.38 u[IU]/mL (ref 0.350–4.500)

## 2013-12-10 LAB — POCT URINALYSIS DIPSTICK
Bilirubin, UA: NEGATIVE
Blood, UA: NEGATIVE
GLUCOSE UA: NEGATIVE
Ketones, UA: NEGATIVE
NITRITE UA: NEGATIVE
PH UA: 7.5
Protein, UA: NEGATIVE
Spec Grav, UA: 1.015
Urobilinogen, UA: 0.2

## 2013-12-10 LAB — LIPID PANEL
CHOL/HDL RATIO: 4.7 ratio
Cholesterol: 173 mg/dL (ref 0–200)
HDL: 37 mg/dL — ABNORMAL LOW (ref >39)
LDL CALC: 99 mg/dL (ref 0–99)
TRIGLYCERIDES: 185 mg/dL — AB (ref <150)
VLDL: 37 mg/dL (ref 0–40)

## 2013-12-10 LAB — BASIC METABOLIC PANEL
BUN: 16 mg/dL (ref 6–23)
CALCIUM: 10.1 mg/dL (ref 8.4–10.5)
CHLORIDE: 104 meq/L (ref 96–112)
CO2: 26 mEq/L (ref 19–32)
CREATININE: 0.84 mg/dL (ref 0.50–1.10)
Glucose, Bld: 109 mg/dL — ABNORMAL HIGH (ref 70–99)
Potassium: 4.4 mEq/L (ref 3.5–5.3)
Sodium: 139 mEq/L (ref 135–145)

## 2013-12-10 MED ORDER — SERTRALINE HCL 50 MG PO TABS: 50.0000 mg | ORAL_TABLET | Freq: Every day | ORAL | Status: DC

## 2013-12-10 MED ORDER — FENOFIBRIC ACID 45 MG PO CPDR: 1.0000 | DELAYED_RELEASE_CAPSULE | Freq: Every day | ORAL | Status: DC

## 2013-12-10 MED ORDER — LISINOPRIL-HYDROCHLOROTHIAZIDE 10-12.5 MG PO TABS: 1.0000 | ORAL_TABLET | Freq: Every day | ORAL | Status: DC

## 2013-12-10 NOTE — Patient Instructions (Signed)
Keeping You Healthy  Get These Tests 1. Blood Pressure- Have your blood pressure checked once a year by your health care provider.  Normal blood pressure is 120/80. 2. Weight- Have your body mass index (BMI) calculated to screen for obesity.  BMI is measure of body fat based on height and weight.  You can also calculate your own BMI at GravelBags.it. 3. Cholesterol- Have your cholesterol checked every 5 years starting at age 49 then yearly starting at age 16. 69. Chlamydia, HIV, and other sexually transmitted diseases- Get screened every year until age 15, then within three months of each new sexual provider. 5. Pap Smear- Every 1-3 years; discuss with your health care provider. 6. Mammogram- Every year starting at age 36  Take these medicines  Calcium with Vitamin D-Your body needs 1200 mg of Calcium each day and [phone] IU of Vitamin D daily.  Your body can only absorb 500 mg of Calcium at a time so Calcium must be taken in 2 or 3 divided doses throughout the day.  Multivitamin with folic acid- Once daily if it is possible for you to become pregnant.  Get these Immunizations  Gardasil-Series of three doses; prevents HPV related illness such as genital warts and cervical cancer.  Menactra-Single dose; prevents meningitis.  Tetanus shot- Every 10 years.  Flu shot-Every year.  Take these steps 1. Do not smoke-Your healthcare provider can help you quit.  For tips on how to quit go to www.smokefree.gov or call 1-800 QUITNOW. 2. Be physically active- Exercise 5 days a week for at least 30 minutes.  If you are not already physically active, start slow and gradually work up to 30 minutes of moderate physical activity.  Examples of moderate activity include walking briskly, dancing, swimming, bicycling, etc. 3. Breast Cancer- A self breast exam every month is important for early detection of breast cancer.  For more information and instruction on self breast exams, ask your  healthcare provider or https://www.patel.info/. 4. Eat a healthy diet- Eat a variety of healthy foods such as fruits, vegetables, whole grains, low fat milk, low fat cheeses, yogurt, lean meats, poultry and fish, beans, nuts, tofu, etc.  For more information go to www. Thenutritionsource.org 5. Drink alcohol in moderation- Limit alcohol intake to one drink or less per day. Never drink and drive. 6. Depression- Your emotional health is as important as your physical health.  If you're feeling down or losing interest in things you normally enjoy please talk to your healthcare provider about being screened for depression. 7. Dental visit- Brush and floss your teeth twice daily; visit your dentist twice a year. 8. Eye doctor- Get an eye exam at least every 2 years. 9. Helmet use- Always wear a helmet when riding a bicycle, motorcycle, rollerblading or skateboarding. 68. Safe sex- If you may be exposed to sexually transmitted infections, use a condom. 11. Seat belts- Seat belts can save your live; always wear one. 12. Smoke/Carbon Monoxide detectors- These detectors need to be installed on the appropriate level of your home. Replace batteries at least once a year. 13. Skin cancer- When out in the sun please cover up and use sunscreen 15 SPF or higher. 14. Violence- If anyone is threatening or hurting you, please tell your healthcare provider.    Exercise to Stay Healthy Exercise helps you become and stay healthy. EXERCISE IDEAS AND TIPS Choose exercises that:  You enjoy.  Fit into your day. You do not need to exercise really hard to be healthy.  You can do exercises at a slow or medium level and stay healthy. You can:  Stretch before and after working out.  Try yoga, Pilates, or tai chi.  Lift weights.  Walk fast, swim, jog, run, climb stairs, bicycle, dance, or rollerskate.  Take aerobic classes. Exercises that burn about 150 calories:  Running 1  miles in 15  minutes.  Playing volleyball for 45 to 60 minutes.  Washing and waxing a car for 45 to 60 minutes.  Playing touch football for 45 minutes.  Walking 1  miles in 35 minutes.  Pushing a stroller 1  miles in 30 minutes.  Playing basketball for 30 minutes.  Raking leaves for 30 minutes.  Bicycling 5 miles in 30 minutes.  Walking 2 miles in 30 minutes.  Dancing for 30 minutes.  Shoveling snow for 15 minutes.  Swimming laps for 20 minutes.  Walking up stairs for 15 minutes.  Bicycling 4 miles in 15 minutes.  Gardening for 30 to 45 minutes.  Jumping rope for 15 minutes.  Washing windows or floors for 45 to 60 minutes. Document Released: 09/01/2010 Document Revised: 10/22/2011 Document Reviewed: 09/01/2010 [facility] Patient Information 2014 [place], [place].    I am going to wait for your lab results before refilling your thyroid medications. All other meds have been refilled for 12 months.

## 2013-12-10 NOTE — Progress Notes (Signed)
Subjective:    Patient ID: [name], female    DOB: 02-05-1966, 48 y.o.   MRN: [id number]  HPI  This 48 y.o. Cauc female is here for CPE; s/p BSO-TAH in 2009 (f/u up w/Gyn Onc).  Pt also sees Dr. Marin Olp and receives B12 injections. Pt takes Levothyroxine for hypothyroidism but has been taking medication w/ other meds.  Lipid panel last year remarkable for elevated TGs and below normal HDL cholesterol w/ TC/HDL ratio= 5.5. Pt has other risk factors for CAD (obesity, post-menopausal female, HTN).  HCM: MMG- Current; needs to be scheduled at [facility]           Vision- Annually; wears corrective lenses/ contacts.  Patient Active Problem List   Diagnosis Date Noted  . Obesity, unspecified 12/03/2012  . HTN (hypertension) 07/30/2012  . Unspecified hypothyroidism 07/30/2012  . Depression 07/30/2012  . Pernicious anemia 07/02/2011  . 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 Authorizing Provider  cholecalciferol (VITAMIN D) 1000 UNITS tablet Take 1,000 Units by mouth daily.     Yes Historical Provider, MD  Choline Fenofibrate (FENOFIBRIC ACID) 45 MG CPDR Take 1 capsule by mouth daily.   Yes Barton Fanny, MD  levothyroxine (SYNTHROID, LEVOTHROID) 75 MCG tablet Take 1 tablet (75 mcg total) by mouth daily.   Yes Barton Fanny, MD  lisinopril-hydrochlorothiazide (PRINZIDE,ZESTORETIC) 10-12.5 MG per tablet Take 1 tablet by mouth daily.   Yes Barton Fanny, MD  Multiple Vitamin (MULTIVITAMIN) tablet Take 1 tablet by mouth daily.     Yes Historical Provider, MD  Omega-3 Fatty Acids (FISH OIL) 1000 MG CAPS Take by mouth daily.   Yes Historical Provider, MD  sertraline (ZOLOFT) 50 MG tablet Take 1 tablet (50 mg total) by mouth daily.   Yes Barton Fanny, MD  vitamin C (ASCORBIC ACID) 500 MG tablet Take 500 mg by mouth daily.     Yes Historical Provider, MD    PMHx, Surg Hx, Soc Hx reviewed.   Pt is adopted (no family hx available).    Review of Systems  Constitutional: Negative.   HENT: Negative.   Eyes: Negative.   Respiratory: Negative.   Cardiovascular: Negative.   Gastrointestinal: Negative.   Endocrine: Negative.   Genitourinary: Negative.   Musculoskeletal: Negative.   Skin: Negative.   Allergic/Immunologic: Negative.   Neurological: Negative.   Hematological: Negative.   Psychiatric/Behavioral: Negative.       Objective:   Physical Exam  Nursing note and vitals reviewed. Constitutional: She is oriented to person, place, and time. Vital signs are normal. She appears well-developed and well-nourished. No distress.  HENT:  Head: Normocephalic and atraumatic.  Right Ear: Hearing, tympanic membrane and ear canal normal.  Left Ear: Hearing, tympanic membrane, external ear and ear canal normal.  Nose: Nose normal. No mucosal edema, nasal deformity or septal deviation.  Mouth/Throat: Uvula is midline, oropharynx is clear and moist and mucous membranes are normal. No oral lesions. Normal dentition. No dental caries. No posterior oropharyngeal erythema.  Eyes: Conjunctivae, EOM and lids are normal. Pupils are equal, round, and reactive to light. No scleral icterus.  Fundoscopic exam:      The right eye shows no papilledema. The right eye shows red reflex.       The left eye shows no papilledema. The left eye shows red reflex.  Annual vision evaluation exam by professional.  Neck: Trachea normal, normal range of  motion and full passive range of motion without pain. Neck supple. No JVD present. No spinous process tenderness and no muscular tenderness present. Carotid bruit is not present. No mass and no thyromegaly present.  Cardiovascular: Normal rate, regular rhythm, S1 normal, S2 normal, normal heart sounds and normal pulses.   No extrasystoles are present. PMI is not displaced.  Exam reveals no gallop and no friction rub.   No murmur heard. Pulmonary/Chest: Breath  sounds normal. No respiratory distress. Right breast exhibits no inverted nipple, no mass, no nipple discharge, no skin change and no tenderness. Left breast exhibits no inverted nipple, no mass, no nipple discharge, no skin change and no tenderness. Breasts are symmetrical.  Abdominal: Soft. Normal appearance and bowel sounds are normal. She exhibits no distension, no pulsatile midline mass and no mass. There is no hepatosplenomegaly. There is no tenderness. There is no guarding and no CVA tenderness.  Genitourinary:  Deferred to GYN.  Musculoskeletal:       Right knee: She exhibits deformity. She exhibits normal range of motion.       Left knee: She exhibits deformity. She exhibits normal range of motion.       Cervical back: Normal.       Thoracic back: Normal.       Lumbar back: Normal.  Remainder of exam grossly normal.  Lymphadenopathy:       Head (right side): No submental, no submandibular, no tonsillar, no posterior auricular and no occipital adenopathy present.       Head (left side): No submental, no submandibular, no tonsillar, no posterior auricular and no occipital adenopathy present.    She has no cervical adenopathy.    She has no axillary adenopathy.       Right: No supraclavicular adenopathy present.       Left: No supraclavicular adenopathy present.  Neurological: She is alert and oriented to person, place, and time. She has normal strength and normal reflexes. She displays no atrophy and no tremor. No cranial nerve deficit or sensory deficit. She exhibits normal muscle tone. Coordination and gait normal.  Reflex Scores:      Tricep reflexes are 2+ on the right side and 2+ on the left side.      Bicep reflexes are 2+ on the right side and 2+ on the left side.      Brachioradialis reflexes are 2+ on the right side and 2+ on the left side.      Patellar reflexes are 2+ on the right side and 2+ on the left side. Skin: Skin is warm, dry and intact. No ecchymosis, no lesion and  no rash noted. She is not diaphoretic. No cyanosis or erythema. No pallor. Nails show no clubbing.  Psychiatric: She has a normal mood and affect. Her speech is normal and behavior is normal. Judgment and thought content normal. Cognition and memory are normal.    Results for orders placed in visit on 12/10/13  POCT URINALYSIS DIPSTICK      Result Value Ref Range   Color, UA yellow     Clarity, UA clear     Glucose, UA neg     Bilirubin, UA neg     Ketones, UA neg     Spec Grav, UA 1.015     Blood, UA neg     pH, UA 7.5     Protein, UA neg     Urobilinogen, UA 0.2     Nitrite, UA neg     Leukocytes,  UA Trace        Assessment & Plan:  Routine general medical examination at a health care facility - Plan: POCT urinalysis dipstick, Basic metabolic panel, Lipid panel, Thyroid Panel With TSH  Unspecified hypothyroidism - Pt advised about proper administration of Levothyroxine- take in morning 30 minutes before meal and/or other meds. Will refill this med after lab resulted. Plan: Basic metabolic panel, Thyroid Panel With TSH  Hypertriglyceridemia - Plan: POCT urinalysis dipstick, Basic metabolic panel, Lipid panel  HTN (hypertension) - Stable and controlled. Continue current medication. Advise increased physical activity for weight loss. Plan: lisinopril-hydrochlorothiazide (PRINZIDE,ZESTORETIC) 10-12.5 MG per tablet  Depression - Depression screen score=0. Stable on current medication. Plan: sertraline (ZOLOFT) 50 MG tablet  Other screening mammogram - Plan: MM Digital Screening  Meds ordered this encounter  Medications  . Choline Fenofibrate (FENOFIBRIC ACID) 45 MG CPDR    Sig: Take 1 capsule by mouth daily.    Dispense:  30 capsule    Refill:  11  . lisinopril-hydrochlorothiazide (PRINZIDE,ZESTORETIC) 10-12.5 MG per tablet    Sig: Take 1 tablet by mouth daily.    Dispense:  30 tablet    Refill:  11  . sertraline (ZOLOFT) 50 MG tablet    Sig: Take 1 tablet (50 mg total)  by mouth daily.    Dispense:  30 tablet    Refill:  11

## 2013-12-29 ENCOUNTER — Ambulatory Visit
Admission: RE | Admit: 2013-12-29 | Discharge: 2013-12-29 | Disposition: A | Discharge status: Home / Self Care | Payer: 59 | Source: Ambulatory Visit | Attending: Family Medicine | Admitting: Family Medicine

## 2013-12-29 DIAGNOSIS — Z1231 Encounter for screening mammogram for malignant neoplasm of breast: Secondary | ICD-10-CM

## 2014-01-17 ENCOUNTER — Encounter: Payer: Self-pay | Admitting: Family Medicine

## 2014-01-19 ENCOUNTER — Other Ambulatory Visit: Payer: Self-pay | Admitting: Family Medicine

## 2014-01-19 DIAGNOSIS — D510 Vitamin B12 deficiency anemia due to intrinsic factor deficiency: Secondary | ICD-10-CM

## 2014-01-19 DIAGNOSIS — H547 Unspecified visual loss: Secondary | ICD-10-CM

## 2014-03-30 ENCOUNTER — Ambulatory Visit (HOSPITAL_BASED_OUTPATIENT_CLINIC_OR_DEPARTMENT_OTHER): Payer: 59

## 2014-03-30 ENCOUNTER — Other Ambulatory Visit (HOSPITAL_BASED_OUTPATIENT_CLINIC_OR_DEPARTMENT_OTHER): Payer: 59 | Admitting: Lab

## 2014-03-30 ENCOUNTER — Encounter: Payer: Self-pay | Admitting: Family

## 2014-03-30 ENCOUNTER — Ambulatory Visit (HOSPITAL_BASED_OUTPATIENT_CLINIC_OR_DEPARTMENT_OTHER): Payer: 59 | Admitting: Family

## 2014-03-30 VITALS — BP 128/80 | HR 72 | Temp 97.7°F | Resp 18 | Wt 258.0 lb

## 2014-03-30 DIAGNOSIS — D510 Vitamin B12 deficiency anemia due to intrinsic factor deficiency: Secondary | ICD-10-CM

## 2014-03-30 LAB — CBC WITH DIFFERENTIAL (CANCER CENTER ONLY)
BASO#: 0 10*3/uL (ref 0.0–0.2)
BASO%: 0.6 % (ref 0.0–2.0)
EOS%: 1.7 % (ref 0.0–7.0)
Eosinophils Absolute: 0.1 10*3/uL (ref 0.0–0.5)
HEMATOCRIT: 41.9 % (ref 34.8–46.6)
HEMOGLOBIN: 14.4 g/dL (ref 11.6–15.9)
LYMPH#: 1.6 10*3/uL (ref 0.9–3.3)
LYMPH%: 28.8 % (ref 14.0–48.0)
MCH: 28.7 pg (ref 26.0–34.0)
MCHC: 34.4 g/dL (ref 32.0–36.0)
MCV: 84 fL (ref 81–101)
MONO#: 0.4 10*3/uL (ref 0.1–0.9)
MONO%: 7.8 % (ref 0.0–13.0)
NEUT%: 61.1 % (ref 39.6–80.0)
NEUTROS ABS: 3.3 10*3/uL (ref 1.5–6.5)
Platelets: 165 10*3/uL (ref 145–400)
RBC: 5.02 10*6/uL (ref 3.70–5.32)
RDW: 13.8 % (ref 11.1–15.7)
WBC: 5.4 10*3/uL (ref 3.9–10.0)

## 2014-03-30 LAB — VITAMIN B12: Vitamin B-12: 361 pg/mL (ref 211–911)

## 2014-03-30 MED ORDER — CYANOCOBALAMIN 1000 MCG/ML IJ SOLN: 1000.0000 ug | Freq: Once | INTRAMUSCULAR | Status: DC

## 2014-03-30 MED ORDER — CYANOCOBALAMIN 1000 MCG/ML IJ SOLN
1000.0000 ug | Freq: Once | INTRAMUSCULAR | Status: AC
  Administered 2014-03-30: 1000 ug via INTRAMUSCULAR

## 2014-03-30 MED ORDER — CYANOCOBALAMIN 1000 MCG/ML IJ SOLN
INTRAMUSCULAR | Status: AC
  Filled 2014-03-30: qty 1

## 2014-03-30 NOTE — Patient Instructions (Signed)

## 2014-03-30 NOTE — Progress Notes (Signed)
[facility]  [phone] [phone]  ID: [name] OB: June 26, 1966 MR#: [id number]#:[id number] Patient Care Team: Barton Fanny, MD as PCP - General (Family Medicine) Johna Sheriff, MD as Consulting Physician (Ophthalmology) Volanda Napoleon, MD as Consulting Physician (Oncology) Janie Morning, MD as Attending Physician (Obstetrics and Gynecology)  DIAGNOSIS: Pernicious anemia  INTERVAL HISTORY: [name] is here today for a follow-up. We see her once a year. She has been getting the vitamin B12 every 6 months. She has had no problems since we last saw her. She denies fever, chills, n/v, cough, rash, headaches, dizziness, SOB, chest pain, palpitations, abdominal pain, constipation, diarrhea, blood in urine or stool. Her appetite is good and she is drinking plenty of fluids. She has had no pain or bleeding. She denies swelling, tenderness, numbness or tingling in her extremities. 2 years ago her B12 level was 367.  CURRENT TREATMENT: The patient to have vitamin B12 one mg IM q.6 months  REVIEW OF SYSTEMS: All other 10 point review of systems is negative.  PAST MEDICAL HISTORY: Past Medical History  Diagnosis Date  . Anemia   . Hypertension   . Anxiety   . mucinous LMP tumor 06/18/2011  . Pernicious anemia 07/02/2011   PAST SURGICAL HISTORY: Past Surgical History  Procedure Laterality Date  . Abdominal hysterectomy  2009    TAHBSO   FAMILY HISTORY Family History  Problem Relation Age of Onset  . Adopted: Yes   GYNECOLOGIC HISTORY:  No LMP recorded. Patient has had a hysterectomy.   SOCIAL HISTORY:  History   Social History  . Marital Status: Single    Spouse Name: N/A    Number of Children: N/A  . Years of Education: N/A   Occupational History  . Unemployed.    Social History Main Topics  . Smoking status: Never Smoker   . Smokeless tobacco: Not on file  . Alcohol Use: No  . Drug Use: No  . Sexual  Activity: No   Other Topics Concern  . Not on file   Social History Narrative   Single. Education: [employer]. Exercise: Walks 1-3 times a week for 30 minutes.   ADVANCED DIRECTIVES: <no information>  HEALTH MAINTENANCE: History  Substance Use Topics  . Smoking status: Never Smoker   . Smokeless tobacco: Not on file  . Alcohol Use: No   Colonoscopy: PAP: Bone density: Lipid panel:  No Known Allergies  Current Outpatient Prescriptions  Medication Sig Dispense Refill  . cholecalciferol (VITAMIN D) 1000 UNITS tablet Take 1,000 Units by mouth daily.        . fenofibrate 54 MG tablet Take 54 mg by mouth daily.      [name] levothyroxine (SYNTHROID, LEVOTHROID) 75 MCG tablet Take 1 tablet (75 mcg total) by mouth daily.  90 tablet  3  . lisinopril-hydrochlorothiazide (PRINZIDE,ZESTORETIC) 10-12.5 MG per tablet Take 1 tablet by mouth daily.  30 tablet  11  . Multiple Vitamin (MULTIVITAMIN) tablet Take 1 tablet by mouth daily.        . Omega-3 Fatty Acids (FISH OIL) 1000 MG CAPS Take by mouth daily.      . vitamin C (ASCORBIC ACID) 500 MG tablet Take 500 mg by mouth daily.        . Choline Fenofibrate (FENOFIBRIC ACID) 45 MG CPDR Take 1 capsule by mouth daily.  30 capsule  11  . sertraline (ZOLOFT) 50 MG tablet Take 1 tablet (50 mg total) by mouth daily.  30 tablet  11   No current facility-administered medications for this visit.   OBJECTIVE: Filed Vitals:   03/30/14 0917  BP: 128/80  Pulse: 72  Temp: 97.7 F (36.5 C)  Resp: 18   Body mass index is 40.4 kg/(m^2). ECOG FS:0 - Asymptomatic Ocular: Sclerae unicteric, pupils equal, round and reactive to light Ear-nose-throat: Oropharynx clear, dentition fair Lymphatic: No cervical or supraclavicular adenopathy Lungs no rales or rhonchi, good excursion bilaterally Heart regular rate and rhythm, no murmur appreciated Abd soft, nontender, positive bowel sounds MSK no focal spinal tenderness, no joint edema Neuro: non-focal,  well-oriented, appropriate affect Breasts: Deferred  LAB RESULTS: CMP     Component Value Date/Time   NA 139 12/10/2013 0921   K 4.4 12/10/2013 0921   CL 104 12/10/2013 0921   CO2 26 12/10/2013 0921   GLUCOSE 109* 12/10/2013 0921   BUN 16 12/10/2013 0921   CREATININE 0.84 12/10/2013 0921   CALCIUM 10.1 12/10/2013 0921   PROT 7.3 06/04/2013 1004   ALBUMIN 4.7 06/04/2013 1004   AST 29 06/04/2013 1004   ALT 36* 06/04/2013 1004   ALKPHOS 45 06/04/2013 1004   BILITOT 0.4 06/04/2013 1004   No results found for this basename: SPEP, UPEP,  kappa and lambda light chains   Lab Results  Component Value Date   WBC 5.4 03/30/2014   NEUTROABS 3.3 03/30/2014   HGB 14.4 03/30/2014   HCT 41.9 03/30/2014   MCV 84 03/30/2014   PLT 165 03/30/2014   No results found for this basename: LABCA2   No components found with this basename: GTXMI680   No results found for this basename: INR,  in the last 168 hours  STUDIES: No results found.  ASSESSMENT/PLAN: [name] is a very pleasant 48 year old white female with pernicious anemia. She has antiparietal cell antibodies. We have been seeing her now for about 15 years.  We will go ahead and give her the B12 injection today. We will schedule her injection for February at [facility] per her request.  We will see her back in 1 year for labs, injection and follow-up.  She is in agreement with this and knows to call here with any questions or concerns. We can certainly see her sooner if need be.    Eliezer Bottom, NP 03/30/2014 11:55 AM

## 2014-05-16 ENCOUNTER — Other Ambulatory Visit: Payer: Self-pay | Admitting: Family Medicine

## 2014-05-17 NOTE — Telephone Encounter (Signed)
Pt is requesting a refill of fenofibric acid 54 mg. Please make sure we refill the 54 mg and not the 45mg  because her insurance will only pay for 54mg .

## 2014-05-17 NOTE — Telephone Encounter (Signed)
Notified pt 54 mg RFs were sent.

## 2014-06-09 ENCOUNTER — Other Ambulatory Visit: Payer: Self-pay | Admitting: Nurse Practitioner

## 2014-06-10 ENCOUNTER — Ambulatory Visit: Payer: 59 | Admitting: Family Medicine

## 2014-06-16 ENCOUNTER — Encounter: Payer: Self-pay | Admitting: Family Medicine

## 2014-06-16 ENCOUNTER — Ambulatory Visit (INDEPENDENT_AMBULATORY_CARE_PROVIDER_SITE_OTHER): Payer: 59 | Admitting: Family Medicine

## 2014-06-16 VITALS — BP 118/98 | HR 106 | Temp 97.0°F | Resp 16 | Ht 67.0 in | Wt 256.0 lb

## 2014-06-16 DIAGNOSIS — E781 Pure hyperglyceridemia: Secondary | ICD-10-CM

## 2014-06-16 DIAGNOSIS — I10 Essential (primary) hypertension: Secondary | ICD-10-CM

## 2014-06-16 DIAGNOSIS — E039 Hypothyroidism, unspecified: Secondary | ICD-10-CM

## 2014-06-16 DIAGNOSIS — E669 Obesity, unspecified: Secondary | ICD-10-CM

## 2014-06-16 LAB — TSH: TSH: 3.153 u[IU]/mL (ref 0.350–4.500)

## 2014-06-16 LAB — LIPID PANEL
CHOL/HDL RATIO: 4.5 ratio
CHOLESTEROL: 161 mg/dL (ref 0–200)
HDL: 36 mg/dL — ABNORMAL LOW (ref >39)
LDL Cholesterol: 88 mg/dL (ref 0–99)
Triglycerides: 187 mg/dL — ABNORMAL HIGH (ref <150)
VLDL: 37 mg/dL (ref 0–40)

## 2014-06-16 LAB — ALT: ALT: 32 U/L (ref 0–35)

## 2014-06-16 LAB — POCT GLYCOSYLATED HEMOGLOBIN (HGB A1C): Hemoglobin A1C: 5.9

## 2014-06-16 MED ORDER — LEVOTHYROXINE SODIUM 75 MCG PO TABS: 75.0000 ug | ORAL_TABLET | Freq: Every day | ORAL | Status: DC

## 2014-06-16 MED ORDER — LISINOPRIL-HYDROCHLOROTHIAZIDE 10-12.5 MG PO TABS: 1.0000 | ORAL_TABLET | Freq: Every day | ORAL | Status: DC

## 2014-06-16 MED ORDER — FENOFIBRATE 54 MG PO TABS: ORAL_TABLET | ORAL | Status: DC

## 2014-06-16 NOTE — Progress Notes (Signed)
This 48 y.o. Cauc female returns for 98-month follow-up and all medical problems are stable, as outlined by Alveta Heimlich, PA-C.  Nothing to add re: subjective or examination. Pt to return for annual CPE in 6 months.  I agree w/ assessment and ongoing plan of care.  Barton Fanny, MD [facility]

## 2014-06-16 NOTE — Progress Notes (Signed)
Subjective:    Patient ID: [name], female    DOB: Jan 04, 1966, 48 y.o.   MRN: [id number]  HPI Patient presents to clinic for follow up for hypertriglyceridemia, HTN, and hypothyroidism and is doing well. Has been compliant with lisinopril-HCTZ, fenofibrate, levothryoxine and needs refills. Medications well tolerated. Has increased exercise to walking 1 hr daily and is improving her diet. Started cutting back on cheese and carbs over the past week. Drinks sodas rarely, 2-3 glasses of water daily. Get home BP readings of 116-120/76-80. Denies fever, SOB, CP, headache, dizziness, edema, or dysphoric mood.  Health Maintenance: Eye exam this year and is a contact wearer. Has had flu vaccine this season.   Review of Systems  Constitutional: Negative for fever, diaphoresis, activity change, appetite change and fatigue.  HENT: Negative.   Eyes: Negative for photophobia, pain and visual disturbance.  Respiratory: Negative for cough, chest tightness and shortness of breath.   Cardiovascular: Negative for chest pain, palpitations and leg swelling.  Gastrointestinal: Negative for nausea, vomiting, abdominal pain, diarrhea, constipation, blood in stool and abdominal distention.  Endocrine: Negative for polydipsia, polyphagia and polyuria.  Genitourinary: Negative.  Negative for dysuria, frequency, hematuria, flank pain, decreased urine volume, vaginal bleeding, vaginal discharge, enuresis and vaginal pain.  Musculoskeletal: Negative for myalgias, back pain, joint swelling and arthralgias.       Plantar fasciitis   Skin: Positive for rash (psoriaisis flare-followed by derm). Negative for wound.  Allergic/Immunologic: Negative for environmental allergies and food allergies.  Neurological: Negative for dizziness, light-headedness, numbness and headaches.  Psychiatric/Behavioral: Negative for behavioral problems, sleep disturbance, dysphoric mood and agitation.       Objective:   Physical  Exam  Constitutional: She is oriented to person, place, and time. She appears well-developed and well-nourished. No distress.  Blood pressure 118/98, pulse 106, temperature 97 F (36.1 C), temperature source Oral, resp. rate 16, height 5\' 7"  (1.702 m), weight 256 lb (116.121 kg), SpO2 97 %.  Rechecked BP- 116/86  HENT:  Head: Normocephalic and atraumatic.  Right Ear: External ear normal.  Left Ear: External ear normal.  Nose: Nose normal.  Mouth/Throat: Oropharynx is clear and moist. No oropharyngeal exudate.  Eyes: Conjunctivae and EOM are normal. Pupils are equal, round, and reactive to light. Right eye exhibits no discharge. Left eye exhibits no discharge. No scleral icterus.  Neck: Normal range of motion. Neck supple. No thyromegaly present.  Cardiovascular: Normal rate, regular rhythm, normal heart sounds and intact distal pulses.  Exam reveals no gallop and no friction rub.   No murmur heard. Pulmonary/Chest: Breath sounds normal. No stridor. No respiratory distress. She has no wheezes. She has no rhonchi. She has no rales. She exhibits no tenderness.  Abdominal: Soft. Bowel sounds are normal. She exhibits no distension. There is no tenderness. There is no rebound and no guarding.  Abdominal exam limited due to habitus  Musculoskeletal: Normal range of motion. She exhibits no edema or tenderness.  Lymphadenopathy:    She has no cervical adenopathy.  Neurological: She is alert and oriented to person, place, and time. She has normal strength. No cranial nerve deficit or sensory deficit. Coordination normal.  Reflex Scores:      Brachioradialis reflexes are 2+ on the right side and 2+ on the left side.      Patellar reflexes are 1+ on the right side and 2+ on the left side.      Achilles reflexes are 2+ on the right side and 2+ on the  left side. Skin: Skin is warm and dry. No rash noted. She is not diaphoretic. No erythema. No pallor.  Psychiatric: She has a normal mood and affect. Her  behavior is normal. Judgment and thought content normal.   Wt Readings from Last 3 Encounters:  06/16/14 256 lb (116.121 kg)  03/30/14 258 lb (117.028 kg)  12/10/13 253 lb (114.76 kg)    Results for orders placed or performed in visit on 06/16/14  POCT glycosylated hemoglobin (Hb A1C)  Result Value Ref Range   Hemoglobin A1C 5.9         Assessment & Plan:  1. Essential hypertension Elevated initially, but second manual BP improved.  - lisinopril-hydrochlorothiazide (PRINZIDE,ZESTORETIC) 10-12.5 MG per tablet; Take 1 tablet by mouth daily.  Dispense: 30 tablet; Refill: 11 - Discussed lifestyle modifications.  2. Hypertriglyceridemia - POCT glycosylated hemoglobin (Hb A1C) - Lipid panel - ALT - fenofibrate 54 mg tablet daily.  3. Hypothyroidism, unspecified hypothyroidism type - Lipid panel - TSH - ALT  4. Obesity Weight increased by 7 lbs since last year. - POCT glycosylated hemoglobin (Hb A1C) - Lipid panel - TSH - ALT - Discussed lifestyle modifications. Very important as HA1C is elevated from 2 years ago along with weight.   Alveta Heimlich PA-C  [facility] 06/16/2014 9:41 AM

## 2014-09-13 ENCOUNTER — Encounter: Payer: Self-pay | Admitting: Family Medicine

## 2014-09-14 ENCOUNTER — Other Ambulatory Visit: Payer: Self-pay | Admitting: Family Medicine

## 2014-09-14 DIAGNOSIS — D510 Vitamin B12 deficiency anemia due to intrinsic factor deficiency: Secondary | ICD-10-CM

## 2014-09-28 ENCOUNTER — Ambulatory Visit (HOSPITAL_BASED_OUTPATIENT_CLINIC_OR_DEPARTMENT_OTHER): Payer: 59

## 2014-09-28 DIAGNOSIS — D510 Vitamin B12 deficiency anemia due to intrinsic factor deficiency: Secondary | ICD-10-CM

## 2014-09-28 MED ORDER — CYANOCOBALAMIN 1000 MCG/ML IJ SOLN
1000.0000 ug | Freq: Once | INTRAMUSCULAR | Status: AC
  Administered 2014-09-28: 1000 ug via INTRAMUSCULAR

## 2014-12-15 ENCOUNTER — Encounter: Payer: Self-pay | Admitting: Family Medicine

## 2014-12-15 ENCOUNTER — Ambulatory Visit (INDEPENDENT_AMBULATORY_CARE_PROVIDER_SITE_OTHER): Payer: 59 | Admitting: Family Medicine

## 2014-12-15 VITALS — BP 130/96 | HR 76 | Temp 97.8°F | Resp 16 | Ht 67.0 in | Wt 250.4 lb

## 2014-12-15 DIAGNOSIS — E034 Atrophy of thyroid (acquired): Secondary | ICD-10-CM | POA: Diagnosis not present

## 2014-12-15 DIAGNOSIS — H547 Unspecified visual loss: Secondary | ICD-10-CM

## 2014-12-15 DIAGNOSIS — Z1231 Encounter for screening mammogram for malignant neoplasm of breast: Secondary | ICD-10-CM | POA: Diagnosis not present

## 2014-12-15 DIAGNOSIS — E786 Lipoprotein deficiency: Secondary | ICD-10-CM | POA: Diagnosis not present

## 2014-12-15 DIAGNOSIS — E781 Pure hyperglyceridemia: Secondary | ICD-10-CM | POA: Diagnosis not present

## 2014-12-15 DIAGNOSIS — I10 Essential (primary) hypertension: Secondary | ICD-10-CM | POA: Diagnosis not present

## 2014-12-15 DIAGNOSIS — E038 Other specified hypothyroidism: Secondary | ICD-10-CM | POA: Diagnosis not present

## 2014-12-15 DIAGNOSIS — Z0000 Encounter for general adult medical examination without abnormal findings: Secondary | ICD-10-CM

## 2014-12-15 DIAGNOSIS — D510 Vitamin B12 deficiency anemia due to intrinsic factor deficiency: Secondary | ICD-10-CM

## 2014-12-15 LAB — COMPLETE METABOLIC PANEL WITH GFR
ALBUMIN: 4.5 g/dL (ref 3.5–5.2)
ALK PHOS: 56 U/L (ref 39–117)
ALT: 31 U/L (ref 0–35)
AST: 23 U/L (ref 0–37)
BILIRUBIN TOTAL: 0.4 mg/dL (ref 0.2–1.2)
BUN: 17 mg/dL (ref 6–23)
CO2: 21 mEq/L (ref 19–32)
CREATININE: 0.75 mg/dL (ref 0.50–1.10)
Calcium: 9.2 mg/dL (ref 8.4–10.5)
Chloride: 104 mEq/L (ref 96–112)
GFR, Est African American: >89 mL/min
Glucose, Bld: 110 mg/dL — ABNORMAL HIGH (ref 70–99)
POTASSIUM: 3.9 meq/L (ref 3.5–5.3)
Sodium: 138 mEq/L (ref 135–145)
Total Protein: 7.2 g/dL (ref 6.0–8.3)

## 2014-12-15 LAB — POCT URINALYSIS DIPSTICK
BILIRUBIN UA: NEGATIVE
GLUCOSE UA: NEGATIVE
Ketones, UA: NEGATIVE
Leukocytes, UA: NEGATIVE
NITRITE UA: NEGATIVE
Protein, UA: NEGATIVE
RBC UA: NEGATIVE
Spec Grav, UA: 1.02
UROBILINOGEN UA: 0.2
pH, UA: 5

## 2014-12-15 LAB — LIPID PANEL
CHOL/HDL RATIO: 4.7 ratio
Cholesterol: 146 mg/dL (ref 0–200)
HDL: 31 mg/dL — AB (ref >=46)
LDL CALC: 92 mg/dL (ref 0–99)
Triglycerides: 117 mg/dL (ref <150)
VLDL: 23 mg/dL (ref 0–40)

## 2014-12-15 LAB — TSH: TSH: 3.283 u[IU]/mL (ref 0.350–4.500)

## 2014-12-15 MED ORDER — FENOFIBRATE 54 MG PO TABS: ORAL_TABLET | ORAL | Status: DC

## 2014-12-15 NOTE — Patient Instructions (Addendum)
Keeping You Healthy  Get These Tests 1. Blood Pressure- Have your blood pressure checked once a year by your health care provider.  Normal blood pressure is 120/80. 2. Weight- Have your body mass index (BMI) calculated to screen for obesity.  BMI is measure of body fat based on height and weight.  You can also calculate your own BMI at GravelBags.it. 3. Cholesterol- Have your cholesterol checked every 5 years starting at age 51 then yearly starting at age 47. 28. Chlamydia, HIV, and other sexually transmitted diseases- Get screened every year until age 57, then within three months of each new sexual provider. 5. Pap Smear- Every 1-5 years; discuss with your health care provider. You do not need PAP unless you are having problems 6. Mammogram- Every 1-2 years starting at age 58--50  Take these medicines  Calcium with Vitamin D-Your body needs 1200 mg of Calcium each day and [phone] IU of Vitamin D daily.  Your body can only absorb 500 mg of Calcium at a time so Calcium must be taken in 2 or 3 divided doses throughout the day.  Multivitamin with folic acid- Once daily if it is possible for you to become pregnant.  Get these Immunizations  Tetanus shot- Every 10 years.  Flu shot-Every year.  Take these steps 1. Do not smoke-Your healthcare provider can help you quit.  For tips on how to quit go to www.smokefree.gov or call 1-800 QUITNOW. 2. Be physically active- Exercise 5 days a week for at least 30 minutes.  If you are not already physically active, start slow and gradually work up to 30 minutes of moderate physical activity.  Examples of moderate activity include walking briskly, dancing, swimming, bicycling, etc. 3. Breast Cancer- A self breast exam every month is important for early detection of breast cancer.  For more information and instruction on self breast exams, ask your healthcare provider or https://www.patel.info/. 4. Eat a healthy diet- Eat a  variety of healthy foods such as fruits, vegetables, whole grains, low fat milk, low fat cheeses, yogurt, lean meats, poultry and fish, beans, nuts, tofu, etc.  For more information go to www. Thenutritionsource.org 5. Drink alcohol in moderation- Limit alcohol intake to one drink or less per day. Never drink and drive. 6. Depression- Your emotional health is as important as your physical health.  If you're feeling down or losing interest in things you normally enjoy please talk to your healthcare provider about being screened for depression. 7. Dental visit- Brush and floss your teeth twice daily; visit your dentist twice a year. 8. Eye doctor- Get an eye exam at least every 2 years. 9. Helmet use- Always wear a helmet when riding a bicycle, motorcycle, rollerblading or skateboarding. 77. Safe sex- If you may be exposed to sexually transmitted infections, use a condom. 11. Seat belts- Seat belts can save your live; always wear one. 12. Smoke/Carbon Monoxide detectors- These detectors need to be installed on the appropriate level of your home. Replace batteries at least once a year. 13. Skin cancer- When out in the sun please cover up and use sunscreen 15 SPF or higher. 14. Violence- If anyone is threatening or hurting you, please tell your healthcare provider.

## 2014-12-15 NOTE — Progress Notes (Signed)
Subjective:    Patient ID: [name], female    DOB: 12-28-1965, 49 y.o.   MRN: [id number]  HPI  This 49 y.o. Female is here for CPE/fasting labs. She requests referrals for insurance purposes: Hem-Onc and Ophthalmology.  HCM: PAP- NA (s/p TAH-BSO); 2013- negative.           MMG- Due.           IMM- Current.           Vision- Due.           Dental- Current.  Patient Active Problem List   Diagnosis Date Noted  . Obesity 12/03/2012  . HTN (hypertension) 07/30/2012  . Hypothyroidism 07/30/2012  . Depression 07/30/2012  . Pernicious anemia 07/02/2011  . mucinous LMP tumor 05/03/2011    Prior to Admission medications   Medication Sig Start Date End Date Taking? Authorizing Provider  cholecalciferol (VITAMIN D) 1000 UNITS tablet Take 1,000 Units by mouth daily.     Yes Historical Provider, MD  fenofibrate 54 MG tablet TAKE 1 TABLET BY MOUTH EVERY DAY 12/15/14  Yes Barton Fanny, MD  levothyroxine (SYNTHROID, LEVOTHROID) 75 MCG tablet Take 1 tablet (75 mcg total) by mouth daily. 06/16/14  Yes Barton Fanny, MD  lisinopril-hydrochlorothiazide (PRINZIDE,ZESTORETIC) 10-12.5 MG per tablet Take 1 tablet by mouth daily. 06/16/14  Yes Barton Fanny, MD  Multiple Vitamin (MULTIVITAMIN) tablet Take 1 tablet by mouth daily.     Yes Historical Provider, MD  Omega-3 Fatty Acids (FISH OIL) 1000 MG CAPS Take by mouth daily.   Yes Historical Provider, MD  vitamin C (ASCORBIC ACID) 500 MG tablet Take 500 mg by mouth daily.     Yes Historical Provider, MD   Past Surgical History  Procedure Laterality Date  . Abdominal hysterectomy  2009    TAHBSO    History   Social History  . Marital Status: Single    Spouse Name: N/A  . Number of Children: N/A  . Years of Education: N/A   Occupational History  . Unemployed.    Social History Main Topics  . Smoking status: Never Smoker   . Smokeless tobacco: Never Used  . Alcohol Use: No  . Drug Use: No  . Sexual Activity: No     Other Topics Concern  . Not on file   Social History Narrative   Single. Education: [employer]. Exercise: Walks 1-3 times a week for 30 minutes.    Family History  Problem Relation Age of Onset  . Adopted: Yes    Review of Systems  Constitutional: Negative.   HENT: Negative.   Eyes: Negative.   Respiratory: Negative.   Cardiovascular: Negative.   Gastrointestinal: Negative.   Endocrine: Negative.   Genitourinary: Negative.   Musculoskeletal: Negative.   Skin: Negative.   Allergic/Immunologic: Negative.   Neurological: Negative.   Hematological: Negative.   Psychiatric/Behavioral: Negative.       Objective:   Physical Exam  Constitutional: She is oriented to person, place, and time. Vital signs are normal. She appears well-developed and well-nourished. No distress.  Blood pressure 130/96, pulse 76, temperature 97.8 F (36.6 C), temperature source Oral, resp. rate 16, height 5\' 7"  (1.702 m), weight 250 lb 6.4 oz (113.581 kg), SpO2 100 %.    HENT:  Head: Normocephalic and atraumatic.  Right Ear: Hearing, tympanic membrane, external ear and ear canal normal.  Left Ear: Hearing, tympanic membrane, external ear and ear canal normal.  Nose: Nose normal. No  nasal deformity or septal deviation.  Mouth/Throat: Uvula is midline, oropharynx is clear and moist and mucous membranes are normal. No oral lesions. Normal dentition. No dental caries.  Eyes: Conjunctivae, EOM and lids are normal. Pupils are equal, round, and reactive to light. No scleral icterus.  Wears contacts.  Neck: Trachea normal, normal range of motion and phonation normal. Neck supple. No spinous process tenderness and no muscular tenderness present. No thyroid mass present.  Cardiovascular: Normal rate, regular rhythm, S1 normal, S2 normal, normal heart sounds, intact distal pulses and normal pulses.   No extrasystoles are present. PMI is not displaced.  Exam reveals no gallop and no friction rub.   No murmur  heard. Pulmonary/Chest: Effort normal and breath sounds normal. No respiratory distress. She has no decreased breath sounds. Right breast exhibits no inverted nipple, no mass, no nipple discharge, no skin change and no tenderness. Left breast exhibits no inverted nipple, no mass, no nipple discharge, no skin change and no tenderness. Breasts are symmetrical.  Abdominal: Soft. Normal appearance and bowel sounds are normal. She exhibits no distension and no mass. There is no hepatosplenomegaly. There is no tenderness. There is no guarding and no CVA tenderness.  Genitourinary:  Deferred.  Musculoskeletal:       Cervical back: Normal.       Thoracic back: Normal.       Lumbar back: Normal.  Remainder of exam unremarkable.  Lymphadenopathy:       Head (right side): No submental, no submandibular, no tonsillar, no preauricular, no posterior auricular and no occipital adenopathy present.       Head (left side): No submental, no submandibular, no tonsillar, no preauricular, no posterior auricular and no occipital adenopathy present.    She has no cervical adenopathy.    She has no axillary adenopathy.       Right: No inguinal and no supraclavicular adenopathy present.       Left: No inguinal and no supraclavicular adenopathy present.  Neurological: She is alert and oriented to person, place, and time. She has normal strength and normal reflexes. She displays no atrophy. No cranial nerve deficit or sensory deficit. She exhibits normal muscle tone. She displays a negative Romberg sign. Coordination and gait normal.  Skin: Skin is warm, dry and intact. No ecchymosis and no rash noted. She is not diaphoretic. No cyanosis or erythema. No pallor. Nails show no clubbing.  Multiple nevi on back.trunk- none suspicious for malignancy.  Psychiatric: She has a normal mood and affect. Her speech is normal and behavior is normal. Judgment and thought content normal. Cognition and memory are normal.    Results for  orders placed or performed in visit on 12/15/14  POCT urinalysis dipstick  Result Value Ref Range   Color, UA yellow    Clarity, UA clear    Glucose, UA neg    Bilirubin, UA neg    Ketones, UA neg    Spec Grav, UA 1.020    Blood, UA neg    pH, UA 5.0    Protein, UA neg    Urobilinogen, UA 0.2    Nitrite, UA neg    Leukocytes, UA Negative       Assessment & Plan:  Well woman exam (no gynecological exam) - Plan: POCT urinalysis dipstick  Hypothyroidism due to acquired atrophy of thyroid - Plan: TSH  Essential hypertension - Plan: COMPLETE METABOLIC PANEL WITH GFR  Abnormally low high density lipoprotein (HDL) cholesterol with hypertriglyceridemia - Continue current medication  and exercise program for increased HDL and weight loss. Plan: Lipid panel  Visit for screening mammogram - Plan: MM Digital Screening  Vision impairment - Plan: Ambulatory referral to Ophthalmology  Pernicious anemia - Plan: Ambulatory referral to Hematology / Oncology   Meds ordered this encounter  Medications  . fenofibrate 54 MG tablet    Sig: TAKE 1 TABLET BY MOUTH EVERY DAY    Dispense:  30 tablet    Refill:  5

## 2014-12-16 ENCOUNTER — Other Ambulatory Visit: Payer: Self-pay | Admitting: Family Medicine

## 2014-12-16 DIAGNOSIS — E781 Pure hyperglyceridemia: Secondary | ICD-10-CM | POA: Insufficient documentation

## 2014-12-16 DIAGNOSIS — E786 Lipoprotein deficiency: Secondary | ICD-10-CM

## 2014-12-16 MED ORDER — FENOFIBRATE 54 MG PO TABS: ORAL_TABLET | ORAL | Status: DC

## 2015-01-03 ENCOUNTER — Ambulatory Visit
Admission: RE | Admit: 2015-01-03 | Discharge: 2015-01-03 | Disposition: A | Discharge status: Home / Self Care | Payer: 59 | Source: Ambulatory Visit | Attending: Family Medicine | Admitting: Family Medicine

## 2015-01-03 DIAGNOSIS — Z1231 Encounter for screening mammogram for malignant neoplasm of breast: Secondary | ICD-10-CM

## 2015-01-20 ENCOUNTER — Ambulatory Visit (HOSPITAL_BASED_OUTPATIENT_CLINIC_OR_DEPARTMENT_OTHER): Payer: 59

## 2015-01-20 ENCOUNTER — Ambulatory Visit (HOSPITAL_BASED_OUTPATIENT_CLINIC_OR_DEPARTMENT_OTHER): Payer: 59 | Admitting: Hematology & Oncology

## 2015-01-20 ENCOUNTER — Other Ambulatory Visit (HOSPITAL_BASED_OUTPATIENT_CLINIC_OR_DEPARTMENT_OTHER): Payer: 59

## 2015-01-20 VITALS — BP 114/60 | HR 70 | Temp 97.5°F | Wt 253.1 lb

## 2015-01-20 DIAGNOSIS — D510 Vitamin B12 deficiency anemia due to intrinsic factor deficiency: Secondary | ICD-10-CM | POA: Diagnosis not present

## 2015-01-20 DIAGNOSIS — E039 Hypothyroidism, unspecified: Secondary | ICD-10-CM

## 2015-01-20 DIAGNOSIS — M810 Age-related osteoporosis without current pathological fracture: Secondary | ICD-10-CM

## 2015-01-20 LAB — CBC WITH DIFFERENTIAL (CANCER CENTER ONLY)
BASO#: 0 10*3/uL (ref 0.0–0.2)
BASO%: 0.5 % (ref 0.0–2.0)
EOS ABS: 0.1 10*3/uL (ref 0.0–0.5)
EOS%: 1.2 % (ref 0.0–7.0)
HEMATOCRIT: 41.5 % (ref 34.8–46.6)
HEMOGLOBIN: 14.3 g/dL (ref 11.6–15.9)
LYMPH#: 1.5 10*3/uL (ref 0.9–3.3)
LYMPH%: 25 % (ref 14.0–48.0)
MCH: 28.9 pg (ref 26.0–34.0)
MCHC: 34.5 g/dL (ref 32.0–36.0)
MCV: 84 fL (ref 81–101)
MONO#: 0.5 10*3/uL (ref 0.1–0.9)
MONO%: 8.3 % (ref 0.0–13.0)
NEUT#: 3.9 10*3/uL (ref 1.5–6.5)
NEUT%: 65 % (ref 39.6–80.0)
PLATELETS: 183 10*3/uL (ref 145–400)
RBC: 4.94 10*6/uL (ref 3.70–5.32)
RDW: 13.9 % (ref 11.1–15.7)
WBC: 5.9 10*3/uL (ref 3.9–10.0)

## 2015-01-20 LAB — VITAMIN B12: Vitamin B-12: 361 pg/mL (ref 211–911)

## 2015-01-20 MED ORDER — CYANOCOBALAMIN 1000 MCG/ML IJ SOLN
1000.0000 ug | Freq: Once | INTRAMUSCULAR | Status: AC
  Administered 2015-01-20: 1000 ug via INTRAMUSCULAR

## 2015-01-20 MED ORDER — CYANOCOBALAMIN 1000 MCG/ML IJ SOLN
INTRAMUSCULAR | Status: AC
  Filled 2015-01-20: qty 1

## 2015-01-20 NOTE — Progress Notes (Signed)
Hematology and Oncology Follow Up Visit  [name] [id number] 09-15-1965 49 y.o. 01/20/2015   Principle Diagnosis:   Pernicious anemia-  anti-parietal cell antibody  Hypothyroidism  Current Therapy:    Vitamin B-12 1000 g IM every 6 months     Interim History:  [name] is back for follow-up. We see her once a year ourselves. She gets her B-12 shots every 6 months. She's doing quite well. She has her 49th birthday coming up in July. [place] be [place] the beach celebrating this.  She's had no issues since we last saw her. She had a mammogram done 2 weeks ago. Everything went okay on the mammogram.  She's had no change in bowel or bladder habits. I am sure that when she turns 49, her primary care doctor will make sure that she has a colonoscopy.  She's had no bleeding. There's been no rashes. She's had no cough. She's had no nausea vomiting. She's had no leg swelling.  She's had no tingling or numbness in the hands or feet.  Medications:  Current outpatient prescriptions:  .  cholecalciferol (VITAMIN D) 1000 UNITS tablet, Take 1,000 Units by mouth daily.  , Disp: , Rfl:  .  fenofibrate 54 MG tablet, TAKE 2 TABLETS BY MOUTH EVERY DAY., Disp: 60 tablet, Rfl: 5 .  levothyroxine (SYNTHROID, LEVOTHROID) 75 MCG tablet, Take 1 tablet (75 mcg total) by mouth daily., Disp: 90 tablet, Rfl: 3 .  lisinopril-hydrochlorothiazide (PRINZIDE,ZESTORETIC) 10-12.5 MG per tablet, Take 1 tablet by mouth daily., Disp: 30 tablet, Rfl: 11 .  Multiple Vitamin (MULTIVITAMIN) tablet, Take 1 tablet by mouth daily.  , Disp: , Rfl:  .  Omega-3 Fatty Acids (FISH OIL) 1000 MG CAPS, Take by mouth daily., Disp: , Rfl:  .  vitamin C (ASCORBIC ACID) 500 MG tablet, Take 500 mg by mouth daily.  , Disp: , Rfl:  No current facility-administered medications for this visit.  Facility-Administered Medications Ordered in Other Visits:  .  cyanocobalamin ((VITAMIN B-12)) injection 1,000 mcg, 1,000 mcg,  Intramuscular, Once, Volanda Napoleon, MD  Allergies: No Known Allergies  Past Medical History, Surgical history, Social history, and Family History were reviewed and updated.  Review of Systems: As above  Physical Exam:  weight is 253 lb 1.9 oz (114.814 kg). Her oral temperature is 97.5 F (36.4 C). Her blood pressure is 114/60 and her pulse is 70.   Wt Readings from Last 3 Encounters:  01/20/15 253 lb 1.9 oz (114.814 kg)  12/15/14 250 lb 6.4 oz (113.581 kg)  06/16/14 256 lb (116.121 kg)     Obese white female in no obvious distress. Head and neck exam shows no ocular or oral lesions. There are no palpable cervical or supraclavicular lymph nodes. Lungs are clear. Cardiac exam regular rate and rhythm with no murmurs, rubs or bruits. Abdomen is soft. She has good bowel sounds. There is no fluid wave. There is no palpable liver or spleen tip. Back exam shows no tenderness over the spine, ribs or hips. Extremities shows no clubbing, cyanosis or edema. Neurological exam shows no focal neurological deficits. Skin exam shows no rashes, ecchymoses or petechia.  Lab Results  Component Value Date   WBC 5.9 01/20/2015   HGB 14.3 01/20/2015   HCT 41.5 01/20/2015   MCV 84 01/20/2015   PLT 183 01/20/2015     Chemistry      Component Value Date/Time   NA 138 12/15/2014 1149   K 3.9 12/15/2014 1149  CL 104 12/15/2014 1149   CO2 21 12/15/2014 1149   BUN 17 12/15/2014 1149   CREATININE 0.75 12/15/2014 1149      Component Value Date/Time   CALCIUM 9.2 12/15/2014 1149   ALKPHOS 56 12/15/2014 1149   AST 23 12/15/2014 1149   ALT 31 12/15/2014 1149   BILITOT 0.4 12/15/2014 1149         Impression and Plan: [name] is a 49 year old white female. She has pernicious anemia. She has anti-parietal cell antibodies. She presented back in 2001.  Of note, she does not have a monthly cycle because she had a hysterectomy for an ovarian tumor which was benign back in 2009.  We will  continue to see her yearly. Patient will get her B-12 shot today and then come back in 6 months just for another B-12 injection.   Volanda Napoleon, MD 6/9/20169:29 AM

## 2015-01-20 NOTE — Patient Instructions (Signed)

## 2015-03-29 ENCOUNTER — Other Ambulatory Visit: Payer: 59

## 2015-03-29 ENCOUNTER — Ambulatory Visit: Payer: 59 | Admitting: Hematology & Oncology

## 2015-03-29 ENCOUNTER — Ambulatory Visit: Payer: 59

## 2015-06-21 ENCOUNTER — Telehealth: Payer: Self-pay

## 2015-06-21 DIAGNOSIS — I10 Essential (primary) hypertension: Secondary | ICD-10-CM

## 2015-06-21 MED ORDER — LEVOTHYROXINE SODIUM 75 MCG PO TABS: 75.0000 ug | ORAL_TABLET | Freq: Every day | ORAL | Status: DC

## 2015-06-21 MED ORDER — LISINOPRIL-HYDROCHLOROTHIAZIDE 10-12.5 MG PO TABS: 1.0000 | ORAL_TABLET | Freq: Every day | ORAL | Status: DC

## 2015-06-21 NOTE — Telephone Encounter (Signed)
Rx sent 

## 2015-06-21 NOTE — Telephone Encounter (Signed)
Patient is calling to request refills for synthroid and lisinopril sent to [facility] on [address] and [address]

## 2015-06-28 ENCOUNTER — Ambulatory Visit (INDEPENDENT_AMBULATORY_CARE_PROVIDER_SITE_OTHER): Payer: 59 | Admitting: Physician Assistant

## 2015-06-28 ENCOUNTER — Encounter: Payer: Self-pay | Admitting: Physician Assistant

## 2015-06-28 VITALS — BP 124/80 | HR 94 | Temp 98.1°F | Resp 16 | Ht 67.0 in | Wt 254.0 lb

## 2015-06-28 DIAGNOSIS — Z114 Encounter for screening for human immunodeficiency virus [HIV]: Secondary | ICD-10-CM

## 2015-06-28 DIAGNOSIS — Z23 Encounter for immunization: Secondary | ICD-10-CM

## 2015-06-28 DIAGNOSIS — I10 Essential (primary) hypertension: Secondary | ICD-10-CM | POA: Diagnosis not present

## 2015-06-28 DIAGNOSIS — E786 Lipoprotein deficiency: Secondary | ICD-10-CM

## 2015-06-28 DIAGNOSIS — E038 Other specified hypothyroidism: Secondary | ICD-10-CM

## 2015-06-28 DIAGNOSIS — E034 Atrophy of thyroid (acquired): Secondary | ICD-10-CM | POA: Diagnosis not present

## 2015-06-28 DIAGNOSIS — E781 Pure hyperglyceridemia: Secondary | ICD-10-CM

## 2015-06-28 DIAGNOSIS — D510 Vitamin B12 deficiency anemia due to intrinsic factor deficiency: Secondary | ICD-10-CM | POA: Diagnosis not present

## 2015-06-28 LAB — TSH: TSH: 2.798 u[IU]/mL (ref 0.350–4.500)

## 2015-06-28 LAB — LIPID PANEL
CHOL/HDL RATIO: 5 ratio (ref <=5.0)
CHOLESTEROL: 159 mg/dL (ref 125–200)
HDL: 32 mg/dL — AB (ref >=46)
LDL Cholesterol: 93 mg/dL (ref <130)
TRIGLYCERIDES: 171 mg/dL — AB (ref <150)
VLDL: 34 mg/dL — AB (ref <30)

## 2015-06-28 MED ORDER — LEVOTHYROXINE SODIUM 75 MCG PO TABS: 75.0000 ug | ORAL_TABLET | Freq: Every day | ORAL | Status: DC

## 2015-06-28 MED ORDER — LISINOPRIL-HYDROCHLOROTHIAZIDE 10-12.5 MG PO TABS: 1.0000 | ORAL_TABLET | Freq: Every day | ORAL | Status: DC

## 2015-06-28 MED ORDER — FENOFIBRATE 54 MG PO TABS: ORAL_TABLET | ORAL | Status: DC

## 2015-06-28 NOTE — Progress Notes (Signed)
Patient ID: [name], female    DOB: [birth year], 49 y.o.   MRN: [id number]  PCP: Ellsworth Lennox, MD  Subjective:   Chief Complaint  Patient presents with  . Follow-up    6 mos  . Hypothyroidism  . Hypertension  . pt need referral    [facility] to receive B12 injections    HPI Patient presents today for 32-month follow-up of HTN, hypertriglyceridemia, and hypothyroidism. Last seen by Dr. Leward Quan on 12/15/14 for her annual wellness visit.   HTN--Currently on lisinopril-HCTZ 10-12.5 mg daily. BP is 124/80 today. Has a BP cuff at home, does daily random BP checks, which have been running normal lately. Yesterday she was 112/72. No complaints of headache, fatigue, dizziness, lightheadedness, chest pain, shortness of breath, or vision changes.   Hypertriglyceridemia--Currently on fenofibrate 54 mg BID and fish oil. Last lipid panel obtained on 12/15/2014, which showed TG of 117 (previously 187 on 06/16/2014). Walking 3 miles/day with a friend who had a stroke in March. Trying to incorporate more fruits and vegetables into her diet and cut back on sweets.   Hypothyroidism--Currently on levothyroxine 75 mcg daily without complaints. Has been taking since 2009.   Recently under a lot of stress d/t her mom fracturing her lumbar spine in March. Has been lifting her a lot. 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   Requesting a referral for B12 injections, which she receives twice a year from Dr. Marin Olp at [facility] for vitamin B12 deficiency. Has been getting these since 2001.   Due for refills on all of her medications in December.   Also due for a colonoscopy, but would like to defer to her next visit.   Would like to get her annual flu vaccination and HIV screening done today.    Review of Systems Constitutional: Negative for fever, chills and fatigue.  Eyes: Negative for visual disturbance.  Respiratory:  Negative for cough and shortness of breath.  Cardiovascular: Negative for chest pain and palpitations.  Gastrointestinal: Negative for nausea, vomiting, abdominal pain and diarrhea.  Musculoskeletal: Negative for myalgias and back pain.  Neurological: Negative for dizziness, light-headedness, numbness and headaches.      Patient Active Problem List   Diagnosis Date Noted  . Abnormally low high density lipoprotein (HDL) cholesterol with hypertriglyceridemia 12/16/2014  . Obesity 12/03/2012  . HTN (hypertension) 07/30/2012  . Hypothyroidism 07/30/2012  . Depression 07/30/2012  . Pernicious anemia 07/02/2011  . 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 Authorizing Provider  cholecalciferol (VITAMIN D) 1000 UNITS tablet Take 1,000 Units by mouth daily.     Yes Historical Provider, MD  fenofibrate 54 MG tablet TAKE 2 TABLETS BY MOUTH EVERY DAY. 12/16/14  Yes Barton Fanny, MD  levothyroxine (SYNTHROID, LEVOTHROID) 75 MCG tablet Take 1 tablet (75 mcg total) by mouth daily. 06/21/15  Yes Mancel Bale, PA-C  lisinopril-hydrochlorothiazide (PRINZIDE,ZESTORETIC) 10-12.5 MG tablet Take 1 tablet by mouth daily. 06/21/15  Yes Mancel Bale, PA-C  Multiple Vitamin (MULTIVITAMIN) tablet Take 1 tablet by mouth daily.     Yes Historical Provider, MD  Omega-3 Fatty Acids (FISH OIL) 1000 MG CAPS Take by mouth daily.   Yes Historical Provider, MD  vitamin C (ASCORBIC ACID) 500 MG tablet Take 500 mg by mouth daily.     Yes Historical Provider, MD  No Known Allergies     Objective:  Physical Exam  Constitutional: She is oriented to person, place, and time. Vital signs are normal. She appears well-developed and well-nourished. She is active and cooperative. No distress.  BP 124/80 mmHg  Pulse 94  Temp(Src) 98.1 F (36.7 C) (Oral)  Resp 16  Ht 5\' 7"  (1.702 m)  Wt 254 lb (115.214 kg)  BMI 39.77 kg/m2  SpO2 97%  HENT:    Head: Normocephalic and atraumatic.  Right Ear: Hearing normal.  Left Ear: Hearing normal.  Eyes: Conjunctivae are normal. No scleral icterus.  Neck: Normal range of motion. Neck supple. No thyromegaly present.  Cardiovascular: Normal rate, regular rhythm and normal heart sounds.   Pulses:      Radial pulses are 2+ on the right side, and 2+ on the left side.  Pulmonary/Chest: Effort normal and breath sounds normal.  Lymphadenopathy:       Head (right side): No tonsillar, no preauricular, no posterior auricular and no occipital adenopathy present.       Head (left side): No tonsillar, no preauricular, no posterior auricular and no occipital adenopathy present.    She has no cervical adenopathy.       Right: No supraclavicular adenopathy present.       Left: No supraclavicular adenopathy present.  Neurological: She is alert and oriented to person, place, and time. No sensory deficit.  Skin: Skin is warm, dry and intact. No rash noted. No cyanosis or erythema. Nails show no clubbing.  Psychiatric: She has a normal mood and affect. Her speech is normal and behavior is normal.           Assessment & Plan:   1. Hypothyroidism due to acquired atrophy of thyroid Await lab results. Adjust dose as indicated. - TSH - levothyroxine (SYNTHROID, LEVOTHROID) 75 MCG tablet; Take 1 tablet (75 mcg total) by mouth daily.  Dispense: 30 tablet; Refill: 12  2. Abnormally low high density lipoprotein (HDL) cholesterol with hypertriglyceridemia Continue healthy lifestyle changes and current medication. - Lipid panel - fenofibrate 54 MG tablet; TAKE 2 TABLETS BY MOUTH EVERY DAY.  Dispense: 60 tablet; Refill: 5  3. Essential hypertension Controlled. Stable. Continue current treatment. - lisinopril-hydrochlorothiazide (PRINZIDE,ZESTORETIC) 10-12.5 MG tablet; Take 1 tablet by mouth daily.  Dispense: 30 tablet; Refill: 12  4. Pernicious anemia Continue B12 injections per hematology. - Ambulatory  referral to Hematology  5. Screening for HIV (human immunodeficiency virus) - HIV antibody  6. Flu vaccine need - Flu Vaccine QUAD 36+ mos IM   Return in about 6 months (around 12/26/2015) for annual wellness visit.    Fara Chute, PA-C Physician Assistant-Certified [facility]

## 2015-06-28 NOTE — Patient Instructions (Signed)
I will contact you with your lab results as soon as they are available.   If you have not heard from me in 2 weeks, please contact me.  The fastest way to get your results is to register for My Chart (see the instructions on the last page of this printout).   

## 2015-06-28 NOTE — Progress Notes (Signed)
Subjective:    Patient ID: [name], female    DOB: 1965-10-24, 49 y.o.   MRN: [id number]  Chief Complaint  Patient presents with  . Follow-up    6 mos  . Hypothyroidism  . Hypertension  . pt need referral    [facility] to receive B12 injections   HPI Patient presents today for 57-month follow-up of HTN, hypertriglyceridemia, and hypothyroidism. Last seen by Dr. Leward Quan on 12/15/14 for her annual wellness visit.   HTN--Currently on lisinopril-HCTZ 10-12.5 mg daily. BP is 124/80 today. Has a BP cuff at home, does daily random BP checks, which have been running normal lately. Yesterday she was 112/72. No complaints of headache, fatigue, dizziness, lightheadedness, chest pain, shortness of breath, or vision changes.   Hypertriglyceridemia--Currently on fenofibrate 54 mg BID and fish oil. Last lipid panel obtained on 12/15/2014, which showed TG of 117 (previously 187 on 06/16/2014). Walking 3 miles/day with a friend who had a stroke in March. Trying to incorporate more fruits and vegetables into her diet and cut back on sweets.  Hypothyroidism--Currently on levothyroxine 75 mcg daily without complaints. Has been taking since 2009.   Recently under a lot of stress d/t her mom fracturing her lumbar spine in March. Has been lifting her a lot. 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   Requesting a referral for B12 injections, which she receives twice a year from Dr. Marin Olp at [facility] for vitamin B12 deficiency. Has been getting these since 2001.    Due for refills on all of her medications in December.   Also due for a colonoscopy, but would like to defer to her next visit. Would like to get her annual flu vaccination and HIV screening done today.   No other concerns on today's visit.   Review of Systems  Constitutional: Negative for fever, chills and fatigue.  Eyes: Negative for visual disturbance.  Respiratory:  Negative for cough and shortness of breath.   Cardiovascular: Negative for chest pain and palpitations.  Gastrointestinal: Negative for nausea, vomiting, abdominal pain and diarrhea.  Musculoskeletal: Negative for myalgias and back pain.  Neurological: Negative for dizziness, light-headedness, numbness and headaches.   Patient Active Problem List   Diagnosis Date Noted  . Abnormally low high density lipoprotein (HDL) cholesterol with hypertriglyceridemia 12/16/2014  . Obesity 12/03/2012  . HTN (hypertension) 07/30/2012  . Hypothyroidism 07/30/2012  . Depression 07/30/2012  . Pernicious anemia 07/02/2011  . 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 Adopted: Yes   Social History   Social History  . Marital Status: Single    Spouse Name: N/A  . Number of Children: N/A  . Years of Education: N/A   Occupational History  . Unemployed.    Social History Main Topics  . Smoking status: Never Smoker   . Smokeless tobacco: Never Used  . Alcohol Use: No  . Drug Use: No  . Sexual Activity: No   Other Topics Concern  . Not on file   Social History Narrative   Single. Education: [employer]. Exercise: Walks 1-3 times a week for 30 minutes.   Prior to Admission medications   Medication Sig Start Date End Date Taking? Authorizing Provider  cholecalciferol (VITAMIN D) 1000 UNITS tablet Take 1,000 Units by mouth daily.     Yes Historical Provider, MD  fenofibrate 54 MG tablet TAKE 2 TABLETS BY MOUTH  EVERY DAY. 12/16/14  Yes Barton Fanny, MD  levothyroxine (SYNTHROID, LEVOTHROID) 75 MCG tablet Take 1 tablet (75 mcg total) by mouth daily. 06/21/15  Yes Mancel Bale, PA-C  lisinopril-hydrochlorothiazide (PRINZIDE,ZESTORETIC) 10-12.5 MG tablet Take 1 tablet by mouth daily. 06/21/15  Yes Mancel Bale, PA-C  Multiple Vitamin (MULTIVITAMIN) tablet Take 1 tablet by mouth daily.     Yes Historical Provider, MD  Omega-3 Fatty Acids (FISH OIL) 1000 MG  CAPS Take by mouth daily.   Yes Historical Provider, MD  vitamin C (ASCORBIC ACID) 500 MG tablet Take 500 mg by mouth daily.     Yes Historical Provider, MD   No Known Allergies    Objective:   Physical Exam  Constitutional: She is oriented to person, place, and time.  Obese female, resting comfortably on exam table, well-groomed.   HENT:  Head: Normocephalic and atraumatic.  Eyes: EOM are normal. No scleral icterus.  Neck: Neck supple.  Cardiovascular: Normal rate, regular rhythm, normal heart sounds and intact distal pulses.  Exam reveals no gallop and no friction rub.   No murmur heard. Pulmonary/Chest: Effort normal and breath sounds normal. No respiratory distress. She has no wheezes. She has no rales.  Lymphadenopathy:    She has no cervical adenopathy.  Neurological: She is alert and oriented to person, place, and time.  Skin: Skin is warm and dry. No rash noted.  Psychiatric: She has a normal mood and affect. Her behavior is normal. Judgment and thought content normal.   BP 124/80 mmHg  Pulse 94  Temp(Src) 98.1 F (36.7 C) (Oral)  Resp 16  Ht 5\' 7"  (1.702 m)  Wt 254 lb (115.214 kg)  BMI 39.77 kg/m2  SpO2 97%     Assessment & Plan:  1. Hypothyroidism due to acquired atrophy of thyroid - TSH - levothyroxine (SYNTHROID, LEVOTHROID) 75 MCG tablet; Take 1 tablet (75 mcg total) by mouth daily.  Dispense: 30 tablet; Refill: 12  2. Abnormally low high density lipoprotein (HDL) cholesterol with hypertriglyceridemia - Continue diet and exercise plan.  - Lipid panel - fenofibrate 54 MG tablet; TAKE 2 TABLETS BY MOUTH EVERY DAY.  Dispense: 60 tablet; Refill: 5  3. Essential hypertension - lisinopril-hydrochlorothiazide (PRINZIDE,ZESTORETIC) 10-12.5 MG tablet; Take 1 tablet by mouth daily.  Dispense: 30 tablet; Refill: 12  4. Pernicious anemia - Ambulatory referral to Hematology  5. Screening for HIV (human immunodeficiency virus) - HIV antibody  6. Flu vaccine need -  Flu Vaccine QUAD 36+ mos IM

## 2015-06-29 LAB — HIV ANTIBODY (ROUTINE TESTING W REFLEX): HIV 1&2 Ab, 4th Generation: NONREACTIVE

## 2015-07-22 ENCOUNTER — Ambulatory Visit (HOSPITAL_BASED_OUTPATIENT_CLINIC_OR_DEPARTMENT_OTHER): Payer: 59

## 2015-07-22 VITALS — BP 125/80 | HR 82 | Temp 97.9°F

## 2015-07-22 DIAGNOSIS — D510 Vitamin B12 deficiency anemia due to intrinsic factor deficiency: Secondary | ICD-10-CM

## 2015-07-22 MED ORDER — CYANOCOBALAMIN 1000 MCG/ML IJ SOLN
1000.0000 ug | Freq: Once | INTRAMUSCULAR | Status: AC
  Administered 2015-07-22: 1000 ug via INTRAMUSCULAR

## 2015-08-14 HISTORY — PX: COLONOSCOPY: SHX174

## 2015-08-14 HISTORY — PX: POLYPECTOMY: SHX149

## 2015-12-21 ENCOUNTER — Other Ambulatory Visit: Payer: Self-pay

## 2015-12-21 DIAGNOSIS — Z1231 Encounter for screening mammogram for malignant neoplasm of breast: Secondary | ICD-10-CM

## 2016-01-03 ENCOUNTER — Encounter: Payer: Self-pay | Admitting: Physician Assistant

## 2016-01-03 ENCOUNTER — Encounter: Payer: Self-pay | Admitting: Internal Medicine

## 2016-01-03 ENCOUNTER — Ambulatory Visit (INDEPENDENT_AMBULATORY_CARE_PROVIDER_SITE_OTHER): Payer: BLUE CROSS/BLUE SHIELD | Admitting: Physician Assistant

## 2016-01-03 VITALS — BP 140/88 | HR 98 | Temp 98.2°F | Resp 18 | Ht 67.0 in | Wt 251.2 lb

## 2016-01-03 DIAGNOSIS — F329 Major depressive disorder, single episode, unspecified: Secondary | ICD-10-CM

## 2016-01-03 DIAGNOSIS — Z1211 Encounter for screening for malignant neoplasm of colon: Secondary | ICD-10-CM | POA: Diagnosis not present

## 2016-01-03 DIAGNOSIS — E038 Other specified hypothyroidism: Secondary | ICD-10-CM | POA: Diagnosis not present

## 2016-01-03 DIAGNOSIS — E034 Atrophy of thyroid (acquired): Secondary | ICD-10-CM | POA: Diagnosis not present

## 2016-01-03 DIAGNOSIS — E669 Obesity, unspecified: Secondary | ICD-10-CM | POA: Diagnosis not present

## 2016-01-03 DIAGNOSIS — R739 Hyperglycemia, unspecified: Secondary | ICD-10-CM

## 2016-01-03 DIAGNOSIS — E786 Lipoprotein deficiency: Secondary | ICD-10-CM | POA: Diagnosis not present

## 2016-01-03 DIAGNOSIS — E781 Pure hyperglyceridemia: Secondary | ICD-10-CM | POA: Diagnosis not present

## 2016-01-03 DIAGNOSIS — D510 Vitamin B12 deficiency anemia due to intrinsic factor deficiency: Secondary | ICD-10-CM | POA: Diagnosis not present

## 2016-01-03 DIAGNOSIS — I10 Essential (primary) hypertension: Secondary | ICD-10-CM

## 2016-01-03 DIAGNOSIS — Z0000 Encounter for general adult medical examination without abnormal findings: Secondary | ICD-10-CM | POA: Diagnosis not present

## 2016-01-03 DIAGNOSIS — F32A Depression, unspecified: Secondary | ICD-10-CM

## 2016-01-03 LAB — COMPREHENSIVE METABOLIC PANEL
ALBUMIN: 4.7 g/dL (ref 3.6–5.1)
ALK PHOS: 51 U/L (ref 33–130)
ALT: 33 U/L — AB (ref 6–29)
AST: 24 U/L (ref 10–35)
BILIRUBIN TOTAL: 0.4 mg/dL (ref 0.2–1.2)
BUN: 14 mg/dL (ref 7–25)
CALCIUM: 9.6 mg/dL (ref 8.6–10.4)
CO2: 21 mmol/L (ref 20–31)
CREATININE: 0.73 mg/dL (ref 0.50–1.05)
Chloride: 105 mmol/L (ref 98–110)
GLUCOSE: 126 mg/dL — AB (ref 65–99)
Potassium: 3.9 mmol/L (ref 3.5–5.3)
SODIUM: 139 mmol/L (ref 135–146)
Total Protein: 7.1 g/dL (ref 6.1–8.1)

## 2016-01-03 LAB — CBC WITH DIFFERENTIAL/PLATELET
BASOS PCT: 0 %
Basophils Absolute: 0 cells/uL (ref 0–200)
EOS PCT: 1 %
Eosinophils Absolute: 59 cells/uL (ref 15–500)
HEMATOCRIT: 42.5 % (ref 35.0–45.0)
HEMOGLOBIN: 14.2 g/dL (ref 11.7–15.5)
LYMPHS ABS: 1593 {cells}/uL (ref 850–3900)
Lymphocytes Relative: 27 %
MCH: 28 pg (ref 27.0–33.0)
MCHC: 33.4 g/dL (ref 32.0–36.0)
MCV: 83.8 fL (ref 80.0–100.0)
MONO ABS: 354 {cells}/uL (ref 200–950)
MPV: 9.7 fL (ref 7.5–12.5)
Monocytes Relative: 6 %
NEUTROS ABS: 3894 {cells}/uL (ref 1500–7800)
Neutrophils Relative %: 66 %
Platelets: 195 10*3/uL (ref 140–400)
RBC: 5.07 MIL/uL (ref 3.80–5.10)
RDW: 14.3 % (ref 11.0–15.0)
WBC: 5.9 10*3/uL (ref 3.8–10.8)

## 2016-01-03 LAB — TSH: TSH: 3.09 m[IU]/L

## 2016-01-03 LAB — POC MICROSCOPIC URINALYSIS (UMFC): Mucus: ABSENT

## 2016-01-03 LAB — LIPID PANEL
Cholesterol: 150 mg/dL (ref 125–200)
HDL: 34 mg/dL — AB (ref >=46)
LDL Cholesterol: 85 mg/dL (ref <130)
Total CHOL/HDL Ratio: 4.4 Ratio (ref <=5.0)
Triglycerides: 157 mg/dL — ABNORMAL HIGH (ref <150)
VLDL: 31 mg/dL — ABNORMAL HIGH (ref <30)

## 2016-01-03 LAB — POCT URINALYSIS DIP (MANUAL ENTRY)
BILIRUBIN UA: NEGATIVE
BILIRUBIN UA: NEGATIVE
Glucose, UA: NEGATIVE
NITRITE UA: NEGATIVE
PH UA: 7
Protein Ur, POC: NEGATIVE
RBC UA: NEGATIVE
SPEC GRAV UA: 1.01
Urobilinogen, UA: 0.2

## 2016-01-03 NOTE — Progress Notes (Signed)
Patient ID: [name], female    DOB: 05-10-1966, 50 y.o.   MRN: [id number]  PCP: Wynne Dust  Subjective:   Chief Complaint  Patient presents with  . Annual Exam    no pap    HPI Presents for Annual wellness visit.  No problems/concerns/adverse medication effects.  Home BP 110-120/70's-80's  Gave up sweets for [place]. Has managed to continue with reduced sugars.  Cervical Cancer Screening: no longer a candidate Breast Cancer Screening: mammogram is scheduled for tomorrow. Colorectal Cancer Screening: due Bone Density Testing: not yet a candidate HIV Screening: due STI Screening: very low risk Seasonal Influenza Vaccination: current Td/Tdap Vaccination: current Pneumococcal Vaccination: not yet a candidate Zoster Vaccination: not yet a candidate Frequency of Dental evaluation: Q6 months Frequency of Eye evaluation: annually (due in June)   Review of Systems  Constitutional: Negative.   HENT: Negative.   Eyes: Negative.   Respiratory: Negative.   Cardiovascular: Negative.   Gastrointestinal: Negative.   Genitourinary: Negative.   Musculoskeletal: Positive for arthralgias (occasional knee pain). Negative for myalgias, back pain, joint swelling, gait problem, neck pain and neck stiffness.  Skin: Negative.   Neurological: Negative.   Psychiatric/Behavioral: Negative.      Depression screen [facility] 2/9 01/03/2016 06/28/2015 12/15/2014 06/16/2014 03/30/2014  Decreased Interest 0 0 0 0 0  Down, Depressed, Hopeless 0 0 0 0 0  PHQ - 2 Score 0 0 0 0 0      Patient Active Problem List   Diagnosis Date Noted  . Abnormally low high density lipoprotein (HDL) cholesterol with hypertriglyceridemia 12/16/2014  . Obesity 12/03/2012  . HTN (hypertension) 07/30/2012  . Hypothyroidism 07/30/2012  . Depression 07/30/2012  . Pernicious anemia 07/02/2011  . 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 Authorizing Provider  cholecalciferol (VITAMIN D) 1000 UNITS tablet Take 1,000 Units by mouth daily.     Yes Historical Provider, MD  fenofibrate 54 MG tablet TAKE 2 TABLETS BY MOUTH EVERY DAY. 06/28/15  Yes Cassundra Mckeever, PA-C  levothyroxine (SYNTHROID, LEVOTHROID) 75 MCG tablet Take 1 tablet (75 mcg total) by mouth daily. 06/28/15  Yes Esau Fridman, PA-C  lisinopril-hydrochlorothiazide (PRINZIDE,ZESTORETIC) 10-12.5 MG tablet Take 1 tablet by mouth daily. 06/28/15  Yes Shereese Bonnie, PA-C  Multiple Vitamin (MULTIVITAMIN) tablet Take 1 tablet by mouth daily.     Yes Historical Provider, MD  Omega-3 Fatty Acids (FISH OIL) 1000 MG CAPS Take by mouth daily.   Yes Historical Provider, MD  vitamin C (ASCORBIC ACID) 500 MG tablet Take 500 mg by mouth daily.     Yes Historical Provider, MD    No Known Allergies   Past Medical History  Diagnosis Date  . Anemia   . Hypertension   . Anxiety   . mucinous LMP tumor 06/18/2011  . Pernicious anemia 07/02/2011  . Vitamin B12 deficiency     Past Surgical History  Procedure Laterality Date  . Abdominal hysterectomy  2009    TAHBSO  . Laparoscopic total hysterectomy Bilateral 2009    cysts    Family History  Problem Relation Age of Onset  . Adopted: Yes    Social History   Social History  . Marital Status: Single    Spouse Name: n/a  . Number of Children: 0  . Years of Education: 12th grade   Occupational History  . Unemployed.     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 Smoking status: Never Smoker   . Smokeless tobacco: Never Used  . Alcohol Use: No  . Drug Use: No  . Sexual Activity: No   Other Topics Concern  . Not on file   Social History Narrative   Lives with and cares for her parents.   Exercise: Walks 1-3 times a week for 30 minutes.        Objective:  Physical Exam  Constitutional: She is oriented to person, place, and time. Vital signs are normal. She appears well-developed  and well-nourished. She is active and cooperative. No distress.  BP 140/88 mmHg  Pulse 98  Temp(Src) 98.2 F (36.8 C) (Oral)  Resp 18  Ht 5\' 7"  (1.702 m)  Wt 251 lb 3.2 oz (113.944 kg)  BMI 39.33 kg/m2  SpO2 98%   HENT:  Head: Normocephalic and atraumatic.  Right Ear: Hearing, tympanic membrane, external ear and ear canal normal. No foreign bodies.  Left Ear: Hearing, tympanic membrane, external ear and ear canal normal. No foreign bodies.  Nose: Nose normal.  Mouth/Throat: Uvula is midline, oropharynx is clear and moist and mucous membranes are normal. No oral lesions. Normal dentition. No dental abscesses or uvula swelling. No oropharyngeal exudate.  Eyes: Conjunctivae, EOM and lids are normal. Pupils are equal, round, and reactive to light. Right eye exhibits no discharge. Left eye exhibits no discharge. No scleral icterus.  Fundoscopic exam:      The right eye shows no arteriolar narrowing, no AV nicking, no exudate, no hemorrhage and no papilledema. The right eye shows red reflex.       The left eye shows no arteriolar narrowing, no AV nicking, no exudate, no hemorrhage and no papilledema. The left eye shows red reflex.  Neck: Trachea normal, normal range of motion and full passive range of motion without pain. Neck supple. No spinous process tenderness and no muscular tenderness present. No thyroid mass and no thyromegaly present.  Cardiovascular: Normal rate, regular rhythm, normal heart sounds, intact distal pulses and normal pulses.   Pulmonary/Chest: Effort normal and breath sounds normal. Right breast exhibits no inverted nipple, no mass, no nipple discharge, no skin change and no tenderness. Left breast exhibits no inverted nipple, no mass, no nipple discharge, no skin change and no tenderness. Breasts are symmetrical.  Musculoskeletal: She exhibits no edema or tenderness.       Cervical back: Normal.       Thoracic back: Normal.       Lumbar back: Normal.  Lymphadenopathy:        Head (right side): No tonsillar, no preauricular, no posterior auricular and no occipital adenopathy present.       Head (left side): No tonsillar, no preauricular, no posterior auricular and no occipital adenopathy present.    She has no cervical adenopathy.       Right: No supraclavicular adenopathy present.       Left: No supraclavicular adenopathy present.  Neurological: She is alert and oriented to person, place, and time. She has normal strength and normal reflexes. No cranial nerve deficit. She exhibits normal muscle tone. Coordination and gait normal.  Skin: Skin is warm, dry and intact. No rash noted. She is not diaphoretic. No cyanosis or erythema. Nails show no clubbing.  Psychiatric: She has a normal mood and affect. Her speech is normal and behavior is normal. Judgment and thought content normal.       Wt Readings from Last 3 Encounters:  01/03/16 251  lb 3.2 oz (113.944 kg)  06/28/15 254 lb (115.214 kg)  01/20/15 253 lb 1.9 oz (114.814 kg)       Assessment & Plan:   1. Annual physical exam Age appropriate anticipatory guidance provided.  2. Screening for colon cancer - Ambulatory referral to Gastroenterology  3. Essential hypertension Controlled. Home monitor reviewed. - POCT urinalysis dipstick - POCT Microscopic Urinalysis ([facility]) - CBC with Differential/Platelet - Comprehensive metabolic panel  4. Hypothyroidism due to acquired atrophy of thyroid - TSH  5. Abnormally low high density lipoprotein (HDL) cholesterol with hypertriglyceridemia - Lipid panel  6. Obesity Healthy lifestyle recommendations.  7. Depression Stable/controlled.  8. Pernicious anemia Continue follow-up per Dr. Marin Olp.    Return in about 6 months (around 07/05/2016).    Fara Chute, PA-C Physician Assistant-Certified [facility]

## 2016-01-03 NOTE — Patient Instructions (Addendum)
   IF you received an x-ray today, you will receive an invoice from [facility]. Please contact [facility] at [phone] with questions or concerns regarding your invoice.   IF you received labwork today, you will receive an invoice from [facility]. Please contact [facility] at [phone] with questions or concerns regarding your invoice.   Our billing staff will not be able to assist you with questions regarding bills from these companies.  You will be contacted with the lab results as soon as they are available. The fastest way to get your results is to activate your My Chart account. Instructions are located on the last page of this paperwork. If you have not heard from us regarding the results in 2 weeks, please contact this office.    Keeping You Healthy  Get These Tests  Blood Pressure- Have your blood pressure checked by your healthcare provider at least once a year.  Normal blood pressure is 120/80.  Weight- Have your body mass index (BMI) calculated to screen for obesity.  BMI is a measure of body fat based on height and weight.  You can calculate your own BMI at www.nhlbisupport.com/bmi/  Cholesterol- Have your cholesterol checked every year.  Diabetes- Have your blood sugar checked every year if you have high blood pressure, high cholesterol, a family history of diabetes or if you are overweight.  Pap Test - Have a pap test every 1 to 5 years if you have been sexually active.  If you are older than 65 and recent pap tests have been normal you may not need additional pap tests.  In addition, if you have had a hysterectomy  for benign disease additional pap tests are not necessary.  Mammogram-Yearly mammograms are essential for early detection of breast cancer  Screening for Colon Cancer- Colonoscopy starting at age 50. Screening may begin sooner depending on your family history and other health conditions.  Follow up colonoscopy  as directed by your Gastroenterologist.  Screening for Osteoporosis- Screening begins at age 65 with bone density scanning, sooner if you are at higher risk for developing Osteoporosis.  Get these medicines  Calcium with Vitamin D- Your body requires 1200-1500 mg of Calcium a day and 800-1000 IU of Vitamin D a day.  You can only absorb 500 mg of Calcium at a time therefore Calcium must be taken in 2 or 3 separate doses throughout the day.  Hormones- Hormone therapy has been associated with increased risk for certain cancers and heart disease.  Talk to your healthcare provider about if you need relief from menopausal symptoms.  Aspirin- Ask your healthcare provider about taking Aspirin to prevent Heart Disease and Stroke.  Get these Immuniztions  Flu shot- Every fall  Pneumonia shot- Once after the age of 65; if you are younger ask your healthcare provider if you need a pneumonia shot.  Tetanus- Every ten years.  Zostavax- Once after the age of 60 to prevent shingles.  Take these steps  Don't smoke- Your healthcare provider can help you quit. For tips on how to quit, ask your healthcare provider or go to www.smokefree.gov or call 1-800 QUIT-NOW.  Be physically active- Exercise 5 days a week for a minimum of 30 minutes.  If you are not already physically active, start slow and gradually work up to 30 minutes of moderate physical activity.  Try walking, dancing, bike riding, swimming, etc.  Eat a healthy diet- Eat a variety of healthy foods such as fruits, vegetables, whole   grains, low fat milk, low fat cheeses, yogurt, lean meats, chicken, fish, eggs, dried beans, tofu, etc.  For more information go to www.thenutritionsource.org  Dental visit- Brush and floss teeth twice daily; visit your dentist twice a year.  Eye exam- Visit your Optometrist or Ophthalmologist yearly.  Drink alcohol in moderation- Limit alcohol intake to one drink or less a day.  Never drink and  drive.  Depression- Your emotional health is as important as your physical health.  If you're feeling down or losing interest in things you normally enjoy, please talk to your healthcare provider.  Seat Belts- can save your life; always wear one  Smoke/Carbon Monoxide detectors- These detectors need to be installed on the appropriate level of your home.  Replace batteries at least once a year.  Violence- If anyone is threatening or hurting you, please tell your healthcare provider.  Living Will/ Health care power of attorney- Discuss with your healthcare provider and family. 

## 2016-01-04 ENCOUNTER — Ambulatory Visit
Admission: RE | Admit: 2016-01-04 | Discharge: 2016-01-04 | Disposition: A | Discharge status: Home / Self Care | Payer: BLUE CROSS/BLUE SHIELD | Source: Ambulatory Visit

## 2016-01-04 DIAGNOSIS — Z1231 Encounter for screening mammogram for malignant neoplasm of breast: Secondary | ICD-10-CM

## 2016-01-06 DIAGNOSIS — E119 Type 2 diabetes mellitus without complications: Secondary | ICD-10-CM | POA: Insufficient documentation

## 2016-01-06 NOTE — Addendum Note (Signed)
Addended by: Fara Chute on: 01/06/2016 07:45 PM   Modules accepted: Orders, SmartSet

## 2016-01-17 ENCOUNTER — Other Ambulatory Visit (INDEPENDENT_AMBULATORY_CARE_PROVIDER_SITE_OTHER): Payer: BLUE CROSS/BLUE SHIELD | Admitting: Physician Assistant

## 2016-01-17 DIAGNOSIS — R739 Hyperglycemia, unspecified: Secondary | ICD-10-CM

## 2016-01-17 LAB — HEMOGLOBIN A1C
Hgb A1c MFr Bld: 6.6 % — ABNORMAL HIGH (ref <5.7)
MEAN PLASMA GLUCOSE: 143 mg/dL

## 2016-01-17 LAB — GLUCOSE, RANDOM: GLUCOSE: 129 mg/dL — AB (ref 65–99)

## 2016-01-20 ENCOUNTER — Ambulatory Visit (HOSPITAL_BASED_OUTPATIENT_CLINIC_OR_DEPARTMENT_OTHER): Payer: BLUE CROSS/BLUE SHIELD

## 2016-01-20 ENCOUNTER — Encounter: Payer: Self-pay | Admitting: Hematology & Oncology

## 2016-01-20 ENCOUNTER — Ambulatory Visit (HOSPITAL_BASED_OUTPATIENT_CLINIC_OR_DEPARTMENT_OTHER): Payer: BLUE CROSS/BLUE SHIELD | Admitting: Hematology & Oncology

## 2016-01-20 ENCOUNTER — Other Ambulatory Visit (HOSPITAL_BASED_OUTPATIENT_CLINIC_OR_DEPARTMENT_OTHER): Payer: BLUE CROSS/BLUE SHIELD

## 2016-01-20 VITALS — BP 130/77 | HR 70 | Temp 97.5°F | Resp 18 | Ht 67.0 in | Wt 251.0 lb

## 2016-01-20 DIAGNOSIS — D510 Vitamin B12 deficiency anemia due to intrinsic factor deficiency: Secondary | ICD-10-CM

## 2016-01-20 DIAGNOSIS — M810 Age-related osteoporosis without current pathological fracture: Secondary | ICD-10-CM

## 2016-01-20 DIAGNOSIS — E039 Hypothyroidism, unspecified: Secondary | ICD-10-CM

## 2016-01-20 LAB — CBC WITH DIFFERENTIAL (CANCER CENTER ONLY)
BASO#: 0 10*3/uL (ref 0.0–0.2)
BASO%: 0.5 % (ref 0.0–2.0)
EOS ABS: 0.1 10*3/uL (ref 0.0–0.5)
EOS%: 1.5 % (ref 0.0–7.0)
HEMATOCRIT: 41.8 % (ref 34.8–46.6)
HEMOGLOBIN: 14.5 g/dL (ref 11.6–15.9)
LYMPH#: 1.5 10*3/uL (ref 0.9–3.3)
LYMPH%: 24.7 % (ref 14.0–48.0)
MCH: 29.1 pg (ref 26.0–34.0)
MCHC: 34.7 g/dL (ref 32.0–36.0)
MCV: 84 fL (ref 81–101)
MONO#: 0.5 10*3/uL (ref 0.1–0.9)
MONO%: 8.3 % (ref 0.0–13.0)
NEUT%: 65 % (ref 39.6–80.0)
NEUTROS ABS: 3.9 10*3/uL (ref 1.5–6.5)
Platelets: 190 10*3/uL (ref 145–400)
RBC: 4.99 10*6/uL (ref 3.70–5.32)
RDW: 13.8 % (ref 11.1–15.7)
WBC: 5.9 10*3/uL (ref 3.9–10.0)

## 2016-01-20 LAB — COMPREHENSIVE METABOLIC PANEL
ALBUMIN: 4.4 g/dL (ref 3.5–5.0)
ALK PHOS: 57 U/L (ref 40–150)
ALT: 35 U/L (ref 0–55)
AST: 24 U/L (ref 5–34)
Anion Gap: 10 mEq/L (ref 3–11)
BILIRUBIN TOTAL: 0.39 mg/dL (ref 0.20–1.20)
BUN: 15.6 mg/dL (ref 7.0–26.0)
CALCIUM: 9.9 mg/dL (ref 8.4–10.4)
CO2: 24 mEq/L (ref 22–29)
Chloride: 106 mEq/L (ref 98–109)
Creatinine: 0.9 mg/dL (ref 0.6–1.1)
EGFR: 75 mL/min/{1.73_m2} — AB (ref >90)
GLUCOSE: 124 mg/dL (ref 70–140)
POTASSIUM: 3.9 meq/L (ref 3.5–5.1)
Sodium: 139 mEq/L (ref 136–145)
TOTAL PROTEIN: 7.7 g/dL (ref 6.4–8.3)

## 2016-01-20 MED ORDER — CYANOCOBALAMIN 1000 MCG/ML IJ SOLN
1000.0000 ug | Freq: Once | INTRAMUSCULAR | Status: AC
  Administered 2016-01-20: 1000 ug via INTRAMUSCULAR

## 2016-01-20 MED ORDER — CYANOCOBALAMIN 1000 MCG/ML IJ SOLN
INTRAMUSCULAR | Status: AC
  Filled 2016-01-20: qty 1

## 2016-01-20 NOTE — Progress Notes (Signed)
Hematology and Oncology Follow Up Visit  [name] [id number] [birth year] 50 y.o. 01/20/2016   Principle Diagnosis:   Pernicious anemia-  anti-parietal cell antibody  Hypothyroidism  Current Therapy:    Vitamin B-12 1000 g IM every 6 months     Interim History:  [name] is back for follow-up. We see her once a year ourselves. She gets her B-12 shots every 6 months. She's doing quite well. She is due for a colonoscopy. She'll have this in a week or so.  She had a mammogram recently. This looked okay.  She bases take care of her parents now. They are quite elderly. She does go to the beach every now and then. As always, she is adequately tan.   overall, her performance status is ECOG 0.  Thankfully, she has not had any problem with infections. She's had no fever. She's had no nausea or vomiting. She's had no leg swelling.  Her blood pressure is under much better control. d no bleeding. There's been no rashes. She's had no cough. She's had no nausea vomiting. She's had no leg swelling.   Medications:  Current outpatient prescriptions:  .  cholecalciferol (VITAMIN D) 1000 UNITS tablet, Take 1,000 Units by mouth daily.  , Disp: , Rfl:  .  fenofibrate 54 MG tablet, TAKE 2 TABLETS BY MOUTH EVERY DAY., Disp: 60 tablet, Rfl: 5 .  levothyroxine (SYNTHROID, LEVOTHROID) 75 MCG tablet, Take 1 tablet (75 mcg total) by mouth daily., Disp: 30 tablet, Rfl: 12 .  lisinopril-hydrochlorothiazide (PRINZIDE,ZESTORETIC) 10-12.5 MG tablet, Take 1 tablet by mouth daily., Disp: 30 tablet, Rfl: 12 .  Multiple Vitamin (MULTIVITAMIN) tablet, Take 1 tablet by mouth daily.  , Disp: , Rfl:  .  Omega-3 Fatty Acids (FISH OIL) 1000 MG CAPS, Take by mouth daily., Disp: , Rfl:  .  vitamin C (ASCORBIC ACID) 500 MG tablet, Take 500 mg by mouth daily.  , Disp: , Rfl:   Allergies: No Known Allergies  Past Medical History, Surgical history, Social history, and Family History were reviewed and  updated.  Review of Systems: As above  Physical Exam:  height is 5\' 7"  (1.702 m) and weight is 251 lb (113.853 kg). Her oral temperature is 97.5 F (36.4 C). Her blood pressure is 130/77 and her pulse is 70. Her respiration is 18.   Wt Readings from Last 3 Encounters:  01/20/16 251 lb (113.853 kg)  01/03/16 251 lb 3.2 oz (113.944 kg)  06/28/15 254 lb (115.214 kg)     Obese white female in no obvious distress. Head and neck exam shows no ocular or oral lesions. There are no palpable cervical or supraclavicular lymph nodes. Lungs are clear. Cardiac exam regular rate and rhythm with no murmurs, rubs or bruits. Abdomen is soft. She has good bowel sounds. There is no fluid wave. There is no palpable liver or spleen tip. Back exam shows no tenderness over the spine, ribs or hips. Extremities shows no clubbing, cyanosis or edema. Neurological exam shows no focal neurological deficits. Skin exam shows no rashes, ecchymoses or petechia.  Lab Results  Component Value Date   WBC 5.9 01/20/2016   HGB 14.5 01/20/2016   HCT 41.8 01/20/2016   MCV 84 01/20/2016   PLT 190 01/20/2016     Chemistry      Component Value Date/Time   NA 139 01/03/2016 1156   K 3.9 01/03/2016 1156   CL 105 01/03/2016 1156   CO2 21 01/03/2016 1156   BUN 14  01/03/2016 1156   CREATININE 0.73 01/03/2016 1156      Component Value Date/Time   CALCIUM 9.6 01/03/2016 1156   ALKPHOS 51 01/03/2016 1156   AST 24 01/03/2016 1156   ALT 33* 01/03/2016 1156   BILITOT 0.4 01/03/2016 1156         Impression and Plan: [name] is a 50 year old white female. She has pernicious anemia. She has anti-parietal cell antibodies. She presented back in 2001.  Of note, she does not have a monthly cycle because she had a hysterectomy for an ovarian tumor which was benign back in 2009.  We will continue to see her yearly. Patient will get her B-12 shot today and then come back in 6 months just for another B-12  injection.   Volanda Napoleon, MD 6/9/20178:56 AM

## 2016-01-20 NOTE — Patient Instructions (Signed)

## 2016-01-21 LAB — VITAMIN B12: Vitamin B12: 271 pg/mL (ref 211–946)

## 2016-01-21 LAB — VITAMIN D 25 HYDROXY (VIT D DEFICIENCY, FRACTURES): VIT D 25 HYDROXY: 35.7 ng/mL (ref 30.0–100.0)

## 2016-01-23 ENCOUNTER — Telehealth: Payer: Self-pay

## 2016-01-23 DIAGNOSIS — E786 Lipoprotein deficiency: Principal | ICD-10-CM

## 2016-01-23 DIAGNOSIS — E781 Pure hyperglyceridemia: Secondary | ICD-10-CM

## 2016-01-23 NOTE — Telephone Encounter (Signed)
[facility] called requesting a refill for patient. Fenofibrate 54 MG Tab.

## 2016-01-24 ENCOUNTER — Other Ambulatory Visit: Payer: Self-pay | Admitting: Physician Assistant

## 2016-01-25 MED ORDER — FENOFIBRATE 54 MG PO TABS: ORAL_TABLET | ORAL | Status: DC

## 2016-01-25 NOTE — Telephone Encounter (Signed)
Sent in RFs.

## 2016-01-31 DIAGNOSIS — H04123 Dry eye syndrome of bilateral lacrimal glands: Secondary | ICD-10-CM | POA: Diagnosis not present

## 2016-01-31 DIAGNOSIS — H2513 Age-related nuclear cataract, bilateral: Secondary | ICD-10-CM | POA: Diagnosis not present

## 2016-01-31 DIAGNOSIS — H35033 Hypertensive retinopathy, bilateral: Secondary | ICD-10-CM | POA: Diagnosis not present

## 2016-01-31 DIAGNOSIS — H35413 Lattice degeneration of retina, bilateral: Secondary | ICD-10-CM | POA: Diagnosis not present

## 2016-02-15 ENCOUNTER — Ambulatory Visit (AMBULATORY_SURGERY_CENTER): Payer: Self-pay

## 2016-02-15 ENCOUNTER — Encounter: Payer: Self-pay | Admitting: Physician Assistant

## 2016-02-15 VITALS — Ht 67.0 in | Wt 254.2 lb

## 2016-02-15 DIAGNOSIS — Z1211 Encounter for screening for malignant neoplasm of colon: Secondary | ICD-10-CM

## 2016-02-15 NOTE — Progress Notes (Signed)
No allergies to eggs or soy No past problems with anesthesia No diet meds No home oxygen  Has email and internet registered for emmi 

## 2016-02-20 ENCOUNTER — Encounter: Payer: Self-pay | Admitting: Internal Medicine

## 2016-02-29 ENCOUNTER — Encounter: Payer: Self-pay | Admitting: Internal Medicine

## 2016-02-29 ENCOUNTER — Ambulatory Visit (AMBULATORY_SURGERY_CENTER): Payer: BLUE CROSS/BLUE SHIELD | Admitting: Internal Medicine

## 2016-02-29 VITALS — BP 112/60 | HR 61 | Temp 98.6°F | Resp 20 | Ht 67.0 in | Wt 254.0 lb

## 2016-02-29 DIAGNOSIS — Z1211 Encounter for screening for malignant neoplasm of colon: Secondary | ICD-10-CM | POA: Diagnosis not present

## 2016-02-29 DIAGNOSIS — D125 Benign neoplasm of sigmoid colon: Secondary | ICD-10-CM | POA: Diagnosis not present

## 2016-02-29 MED ORDER — SODIUM CHLORIDE 0.9 % IV SOLN: 500.0000 mL | INTRAVENOUS | Status: DC

## 2016-02-29 NOTE — Progress Notes (Signed)
Called to room to assist during endoscopic procedure.  Patient ID and intended procedure confirmed with present staff. Received instructions for my participation in the procedure from the performing physician.  

## 2016-02-29 NOTE — Patient Instructions (Signed)
YOU HAD AN ENDOSCOPIC PROCEDURE TODAY AT [facility]:   Refer to the procedure report that was given to you for any specific questions about what was found during the examination.  If the procedure report does not answer your questions, please call your gastroenterologist to clarify.  If you requested that your care partner not be given the details of your procedure findings, then the procedure report has been included in a sealed envelope for you to review at your convenience later.  YOU SHOULD EXPECT: Some feelings of bloating in the abdomen. Passage of more gas than usual.  Walking can help get rid of the air that was put into your GI tract during the procedure and reduce the bloating. If you had a lower endoscopy (such as a colonoscopy or flexible sigmoidoscopy) you may notice spotting of blood in your stool or on the toilet paper. If you underwent a bowel prep for your procedure, you may not have a normal bowel movement for a few days.  Please Note:  You might notice some irritation and congestion in your nose or some drainage.  This is from the oxygen used during your procedure.  There is no need for concern and it should clear up in a day or so.  SYMPTOMS TO REPORT IMMEDIATELY:   Following lower endoscopy (colonoscopy or flexible sigmoidoscopy):  Excessive amounts of blood in the stool  Significant tenderness or worsening of abdominal pains  Swelling of the abdomen that is new, acute  Fever of 100F or higher   For urgent or emergent issues, a gastroenterologist can be reached at any hour by calling [phone].   DIET: Your first meal following the procedure should be a small meal and then it is ok to progress to your normal diet. Heavy or fried foods are harder to digest and may make you feel nauseous or bloated.  Likewise, meals heavy in dairy and vegetables can increase bloating.  Drink plenty of fluids but you should avoid alcoholic beverages for 24  hours.  ACTIVITY:  You should plan to take it easy for the rest of today and you should NOT DRIVE or use heavy machinery until tomorrow (because of the sedation medicines used during the test).    FOLLOW UP: Our staff will call the number listed on your records the next business day following your procedure to check on you and address any questions or concerns that you may have regarding the information given to you following your procedure. If we do not reach you, we will leave a message.  However, if you are feeling well and you are not experiencing any problems, there is no need to return our call.  We will assume that you have returned to your regular daily activities without incident. io If any biopsies were taken you will be contacted by phone or by letter within the next 1-3 weeks.  Please call us at [phone] if you have not heard about the biopsies in 3 weeks.    SIGNATURES/CONFIDENTIALITY: You and/or your care partner have signed paperwork which will be entered into your electronic medical record.  These signatures attest to the fact that that the information above on your After Visit Summary has been reviewed and is understood.  Full responsibility of the confidentiality of this discharge information lies with you and/or your care-partner.  Polyp and diverticulosis information given.

## 2016-02-29 NOTE — Op Note (Signed)
[facility] Patient Name: [name] Procedure Date: 02/29/2016 9:52 AM MRN: [id number] Endoscopist: Gatha Mayer , MD Age: 50 Referring MD:  Date of Birth: 16-Jun-1966 Gender: Female Account #: [id number] Procedure:                Colonoscopy Indications:              Screening for colorectal malignant neoplasm, This                            is the patient's first colonoscopy Medicines:                Propofol per Anesthesia, Monitored Anesthesia Care Procedure:                Pre-Anesthesia Assessment:                           - Prior to the procedure, a History and Physical                            was performed, and patient medications and                            allergies were reviewed. The patient's tolerance of                            previous anesthesia was also reviewed. The risks                            and benefits of the procedure and the sedation                            options and risks were discussed with the patient.                            All questions were answered, and informed consent                            was obtained. Prior Anticoagulants: The patient has                            taken no previous anticoagulant or antiplatelet                            agents. ASA Grade Assessment: II - A patient with                            mild systemic disease. After reviewing the risks                            and benefits, the patient was deemed in                            satisfactory condition to undergo the procedure.  After obtaining informed consent, the colonoscope                            was passed under direct vision. Throughout the                            procedure, the patient's blood pressure, pulse, and                            oxygen saturations were monitored continuously. The                            Model CF-HQ190L [id number]) scope was introduced      through the anus and advanced to the the terminal                            ileum, with identification of the appendiceal                            orifice and IC valve. The colonoscopy was performed                            without difficulty. The patient tolerated the                            procedure well. The quality of the bowel                            preparation was good. The bowel preparation used                            was Miralax. The terminal ileum, ileocecal valve,                            appendiceal orifice, and rectum were photographed. Scope In: 10:15:37 AM Scope Out: 10:31:27 AM Scope Withdrawal Time: 0 hours 13 minutes 48 seconds  Total Procedure Duration: 0 hours 15 minutes 50 seconds  Findings:                 The perianal and digital rectal examinations were                            normal.                           A 6 mm polyp was found in the sigmoid colon. The                            polyp was sessile. The polyp was removed with a                            cold snare. Resection and retrieval were complete.  Verification of patient identification for the                            specimen was done. Estimated blood loss was minimal.                           Multiple small-mouthed diverticula were found in                            the sigmoid colon. There was no evidence of                            diverticular bleeding.                           The terminal ileum appeared normal.                           The exam was otherwise without abnormality on                            direct and retroflexion views. Complications:            No immediate complications. Estimated Blood Loss:     Estimated blood loss was minimal. Impression:               - One 6 mm polyp in the sigmoid colon, removed with                            a cold snare. Resected and retrieved.                           - Moderate  diverticulosis in the sigmoid colon.                            There was no evidence of diverticular bleeding.                           - The examined portion of the ileum was normal.                           - The examination was otherwise normal on direct                            and retroflexion views. Recommendation:           - Patient has a contact number available for                            emergencies. The signs and symptoms of potential                            delayed complications were discussed with the                            patient. Return  to normal activities tomorrow.                            Written discharge instructions were provided to the                            patient.                           - Resume previous diet.                           - Continue present medications.                           - Repeat colonoscopy is recommended. The                            colonoscopy date will be determined after pathology                            results from today's exam become available for                            review. Gatha Mayer, MD 02/29/2016 10:40:59 AM This report has been signed electronically.

## 2016-02-29 NOTE — Progress Notes (Signed)
Stable to RR 

## 2016-03-01 ENCOUNTER — Telehealth: Payer: Self-pay

## 2016-03-01 NOTE — Telephone Encounter (Signed)
  Follow up Call-  Call back number 02/29/2016  Post procedure Call Back phone  # [phone]  Permission to leave phone message Yes     Patient questions:  Do you have a fever, pain , or abdominal swelling? No. Pain Score  0 *  Have you tolerated food without any problems? Yes.    Have you been able to return to your normal activities? Yes.    Do you have any questions about your discharge instructions: Diet   No. Medications  No. Follow up visit  No.  Do you have questions or concerns about your Care? No.  Actions: * If pain score is 4 or above: No action needed, pain <4.

## 2016-03-08 ENCOUNTER — Encounter: Payer: Self-pay | Admitting: Internal Medicine

## 2016-03-08 DIAGNOSIS — Z860101 Personal history of adenomatous and serrated colon polyps: Secondary | ICD-10-CM | POA: Insufficient documentation

## 2016-03-08 DIAGNOSIS — Z86010 Personal history of colonic polyps: Secondary | ICD-10-CM

## 2016-03-08 HISTORY — DX: Personal history of colonic polyps: Z86.010

## 2016-03-08 NOTE — Progress Notes (Signed)
6 mm adenoma - recall 2022

## 2016-04-23 DIAGNOSIS — L308 Other specified dermatitis: Secondary | ICD-10-CM | POA: Diagnosis not present

## 2016-04-23 DIAGNOSIS — Z23 Encounter for immunization: Secondary | ICD-10-CM | POA: Diagnosis not present

## 2016-07-10 ENCOUNTER — Ambulatory Visit (INDEPENDENT_AMBULATORY_CARE_PROVIDER_SITE_OTHER): Payer: BLUE CROSS/BLUE SHIELD | Admitting: Physician Assistant

## 2016-07-10 ENCOUNTER — Encounter: Payer: Self-pay | Admitting: Physician Assistant

## 2016-07-10 VITALS — BP 122/70 | HR 102 | Temp 98.9°F | Resp 18 | Ht 67.0 in | Wt 256.0 lb

## 2016-07-10 DIAGNOSIS — E034 Atrophy of thyroid (acquired): Secondary | ICD-10-CM | POA: Diagnosis not present

## 2016-07-10 DIAGNOSIS — I10 Essential (primary) hypertension: Secondary | ICD-10-CM

## 2016-07-10 DIAGNOSIS — R739 Hyperglycemia, unspecified: Secondary | ICD-10-CM

## 2016-07-10 LAB — COMPREHENSIVE METABOLIC PANEL
ALBUMIN: 4.8 g/dL (ref 3.6–5.1)
ALK PHOS: 54 U/L (ref 33–130)
ALT: 40 U/L — AB (ref 6–29)
AST: 27 U/L (ref 10–35)
BILIRUBIN TOTAL: 0.5 mg/dL (ref 0.2–1.2)
BUN: 17 mg/dL (ref 7–25)
CALCIUM: 9.9 mg/dL (ref 8.6–10.4)
CO2: 24 mmol/L (ref 20–31)
Chloride: 105 mmol/L (ref 98–110)
Creat: 0.77 mg/dL (ref 0.50–1.05)
Glucose, Bld: 153 mg/dL — ABNORMAL HIGH (ref 65–99)
POTASSIUM: 4.2 mmol/L (ref 3.5–5.3)
Sodium: 138 mmol/L (ref 135–146)
Total Protein: 7.4 g/dL (ref 6.1–8.1)

## 2016-07-10 LAB — TSH: TSH: 2.36 m[IU]/L

## 2016-07-10 NOTE — Progress Notes (Signed)
Patient ID: [name], female    DOB: 1965-11-29, 49 y.o.   MRN: [id number]  PCP: Harrison Mons, PA-C  Chief Complaint  Patient presents with  . Follow-up    6 MONTH ON MEDS    Subjective:   Presents for evaluation of HTN.  Tolerating her medications without difficulty. Home readings 110's/65-80. No CP, SOB, HA, dizziness, N/V.  Labs 01/20/2016: Glucose 129 A1C 6.6% Lipids: TC 150, TG 157, HDL 34, LDL 85  Pernicious anemia managed by Dr. Marin Olp.  Resolving URI. Has some residual nasal congestion.   Review of Systems Constitutional: Negative for appetite change, chills, fatigue and fever.  HENT: Positive for congestion. Negative for ear discharge, ear pain, postnasal drip, rhinorrhea, sinus pain, sinus pressure and sore throat.   Eyes: Negative for photophobia, pain, discharge, redness and itching.  Respiratory: Negative for cough, chest tightness, shortness of breath and wheezing.   Cardiovascular: Negative for chest pain, palpitations and leg swelling.  Gastrointestinal: Negative for abdominal pain, blood in stool, constipation, diarrhea, nausea and vomiting.  Genitourinary: Negative for difficulty urinating, dysuria, frequency, hematuria and urgency.  Neurological: Negative for dizziness, syncope, weakness and headaches.  Psychiatric/Behavioral: Negative for dysphoric mood.     Patient Active Problem List   Diagnosis Date Noted  . Hx of adenomatous polyp of colon 03/08/2016  . Hyperglycemia 01/06/2016  . Abnormally low high density lipoprotein (HDL) cholesterol with hypertriglyceridemia 12/16/2014  . Obesity 12/03/2012  . HTN (hypertension) 07/30/2012  . Hypothyroidism 07/30/2012  . Depression 07/30/2012  . Pernicious anemia 07/02/2011  . 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 Authorizing Provider  cholecalciferol (VITAMIN D) 1000 UNITS tablet Take 2,000 Units by mouth daily.  2000   Yes Historical Provider, MD  fenofibrate 54 MG tablet TAKE 2 TABLETS BY MOUTH EVERY DAY. 01/25/16  Yes Ervie Mccard, PA-C  levothyroxine (SYNTHROID, LEVOTHROID) 75 MCG tablet Take 1 tablet (75 mcg total) by mouth daily. 06/28/15  Yes Sherin Murdoch, PA-C  lisinopril-hydrochlorothiazide (PRINZIDE,ZESTORETIC) 10-12.5 MG tablet Take 1 tablet by mouth daily. 06/28/15  Yes Sophiamarie Nease, PA-C  Multiple Vitamin (MULTIVITAMIN) tablet Take 1 tablet by mouth daily.     Yes Historical Provider, MD  Omega-3 Fatty Acids (FISH OIL) 1000 MG CAPS Take 1,200 mg by mouth daily.    Yes Historical Provider, MD  vitamin C (ASCORBIC ACID) 500 MG tablet Take 1,000 mg by mouth daily.    Yes Historical Provider, MD     No Known Allergies     Objective:  Physical Exam  Constitutional: She is oriented to person, place, and time. She appears well-developed and well-nourished. She is active and cooperative. No distress.  BP 122/70 (BP Location: Left Arm, Patient Position: Sitting, Cuff Size: Large)   Pulse (!) 102   Temp 98.9 F (37.2 C) (Oral)   Resp 18   Ht 5\' 7"  (1.702 m)   Wt 256 lb (116.1 kg)   SpO2 96%   BMI 40.10 kg/m   HENT:  Head: Normocephalic and atraumatic.  Right Ear: Hearing normal.  Left Ear: Hearing normal.  Eyes: Conjunctivae are normal. No scleral icterus.  Neck: Normal range of motion. Neck supple. No thyromegaly present.  Cardiovascular: Normal rate, regular rhythm and normal heart sounds.   Pulses:      Radial pulses are 2+ on the right side, and 2+ on the left side.  Pulmonary/Chest: Effort normal and breath sounds normal.  Lymphadenopathy:       Head (right side): No tonsillar, no preauricular, no posterior auricular and no occipital adenopathy present.       Head (left side): No tonsillar, no preauricular, no posterior auricular and no occipital adenopathy present.    She has no cervical adenopathy.       Right: No supraclavicular adenopathy present.       Left: No  supraclavicular adenopathy present.  Neurological: She is alert and oriented to person, place, and time. No sensory deficit.  Skin: Skin is warm, dry and intact. No rash noted. No cyanosis or erythema. Nails show no clubbing.  Psychiatric: She has a normal mood and affect. Her speech is normal and behavior is normal.           Assessment & Plan:   1. Essential hypertension Controlled. Continue current regimen. - Comprehensive metabolic panel  2. Hyperglycemia Await labs. - Hemoglobin A1c  3. Hypothyroidism due to acquired atrophy of thyroid Await labs. Adjust regimen as indicated by results. - TSH  Return in about 6 months (around 01/07/2017) for annual wellness and follow-up.    Fara Chute, PA-C Physician Assistant-Certified [facility]

## 2016-07-10 NOTE — Progress Notes (Signed)
Subjective:    Patient ID: [name], female    DOB: 1966/01/08, 50 y.o.   MRN: [id number]  Chief Complaint  Patient presents with  . Follow-up    6 MONTH ON MEDS   HPI: Presents for 36-month follow-up of hypertension. States her blood pressure has been well controlled on the Lisinopril-HCTZ 10-12.5 mg daily. Checks her blood pressure regularly at home and notes range of of 116/65-117/80 mmHg. Patient denies chest pain, headaches, visual changes, shortness of breath, palpitations. Labs from last visit 6 months ago revealed glucose 126 mg/dL, total cholesterol 150 mg/dL, triglycerides 157 mg/dL, HDL 34 mg/dL, total chol/HDL ratio 4.4, VLDL 31 mg/dL, LDL 85 mg/dL, TSH 3.09 mIU/L, and UA within normal limits.   Patient with history of pernicious anemia for which she is seen by Dr. Marin Olp. States she used to see him monthly but now sees him every 6 months. Receives B12 injection there.   History of hypothyroidism currently well-controlled on 75 mcg Levothyroxine. Denies current symptoms.   Currently has some nasal congestion, states she is getting over a cold. Endorses receiving flu vaccine at [facility].  Review of Systems  Constitutional: Negative for appetite change, chills, fatigue and fever.  HENT: Positive for congestion. Negative for ear discharge, ear pain, postnasal drip, rhinorrhea, sinus pain, sinus pressure and sore throat.   Eyes: Negative for photophobia, pain, discharge, redness and itching.  Respiratory: Negative for cough, chest tightness, shortness of breath and wheezing.   Cardiovascular: Negative for chest pain, palpitations and leg swelling.  Gastrointestinal: Negative for abdominal pain, blood in stool, constipation, diarrhea, nausea and vomiting.  Genitourinary: Negative for difficulty urinating, dysuria, frequency, hematuria and urgency.  Neurological: Negative for dizziness, syncope, weakness and headaches.  Psychiatric/Behavioral: Negative for dysphoric  mood.   No Known Allergies  Prior to Admission medications   Medication Sig Start Date End Date Taking? Authorizing Provider  cholecalciferol (VITAMIN D) 1000 UNITS tablet Take 2,000 Units by mouth daily. 2000   Yes Historical Provider, MD  fenofibrate 54 MG tablet TAKE 2 TABLETS BY MOUTH EVERY DAY. 01/25/16  Yes Chelle Jeffery, PA-C  levothyroxine (SYNTHROID, LEVOTHROID) 75 MCG tablet Take 1 tablet (75 mcg total) by mouth daily. 06/28/15  Yes Chelle Jeffery, PA-C  lisinopril-hydrochlorothiazide (PRINZIDE,ZESTORETIC) 10-12.5 MG tablet Take 1 tablet by mouth daily. 06/28/15  Yes Chelle Jeffery, PA-C  Multiple Vitamin (MULTIVITAMIN) tablet Take 1 tablet by mouth daily.     Yes Historical Provider, MD  Omega-3 Fatty Acids (FISH OIL) 1000 MG CAPS Take 1,200 mg by mouth daily.    Yes Historical Provider, MD  vitamin C (ASCORBIC ACID) 500 MG tablet Take 1,000 mg by mouth daily.    Yes Historical Provider, MD   Patient Active Problem List   Diagnosis Date Noted  . Hx of adenomatous polyp of colon 03/08/2016  . Hyperglycemia 01/06/2016  . Abnormally low high density lipoprotein (HDL) cholesterol with hypertriglyceridemia 12/16/2014  . Obesity 12/03/2012  . HTN (hypertension) 07/30/2012  . Hypothyroidism 07/30/2012  . Depression 07/30/2012  . Pernicious anemia 07/02/2011  . 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 rate 18, height 5\' 7"  (1.702 m), weight 256 lb (116.1 kg), SpO2 96 %.   Physical Exam  Constitutional: She is oriented to person, place, and time. She appears well-developed and well-nourished. No distress.  HENT:  Head: Normocephalic and atraumatic.  Eyes: Pupils are equal, round,  and reactive to light. Right eye exhibits no discharge and no exudate. Left eye exhibits no discharge and no exudate. Right conjunctiva is not injected. Left conjunctiva is not injected. No scleral  icterus. Right pupil is round and reactive. Left pupil is round and reactive. Pupils are equal.  Neck: Normal range of motion. Neck supple. No neck rigidity. No tracheal deviation, no edema and normal range of motion present. No thyromegaly present.  Cardiovascular: Normal rate, regular rhythm, S1 normal, S2 normal, normal heart sounds and intact distal pulses.  Exam reveals no gallop, no distant heart sounds and no friction rub.   No murmur heard. Pulses:      Radial pulses are 2+ on the right side, and 2+ on the left side.  Pulmonary/Chest: Effort normal and breath sounds normal. No accessory muscle usage or stridor. No respiratory distress. She has no decreased breath sounds. She has no wheezes. She has no rhonchi. She has no rales.  Lymphadenopathy:       Head (right side): No submental, no submandibular, no tonsillar, no preauricular and no posterior auricular adenopathy present.       Head (left side): No submental, no submandibular, no tonsillar, no preauricular and no posterior auricular adenopathy present.    She has no cervical adenopathy.  Neurological: She is alert and oriented to person, place, and time.  Skin: Skin is warm and dry. No rash noted. She is not diaphoretic. No erythema.  Psychiatric: She has a normal mood and affect. Her behavior is normal.      Assessment & Plan:  1. Essential hypertension Currently well-controlled on Lisinopril-HCTZ daily. Continue medication as prescribed and will re-evaluate and make changes as needed upon review of lab results. - Comprehensive metabolic panel  2. Hyperglycemia History of mild hyperglycemia. Screening HgA1C today and will update patient and make changes as needed upon review of results. - Hemoglobin A1c  3. Hypothyroidism due to acquired atrophy of thyroid Last TSH 6 months ago within normal limits. Will re-evaluate today and update medication regimen as needed. - TSH

## 2016-07-10 NOTE — Patient Instructions (Signed)
     IF you received an x-ray today, you will receive an invoice from [facility]. Please contact [facility] at [phone] with questions or concerns regarding your invoice.   IF you received labwork today, you will receive an invoice from [facility]. Please contact [facility] at [phone] with questions or concerns regarding your invoice.   Our billing staff will not be able to assist you with questions regarding bills from these companies.  You will be contacted with the lab results as soon as they are available. The fastest way to get your results is to activate your My Chart account. Instructions are located on the last page of this paperwork. If you have not heard from us regarding the results in 2 weeks, please contact this office.      

## 2016-07-11 LAB — HEMOGLOBIN A1C
Hgb A1c MFr Bld: 6.4 % — ABNORMAL HIGH (ref <5.7)
MEAN PLASMA GLUCOSE: 137 mg/dL

## 2016-07-15 ENCOUNTER — Other Ambulatory Visit: Payer: Self-pay | Admitting: Physician Assistant

## 2016-07-15 DIAGNOSIS — E786 Lipoprotein deficiency: Principal | ICD-10-CM

## 2016-07-15 DIAGNOSIS — E781 Pure hyperglyceridemia: Secondary | ICD-10-CM

## 2016-07-15 NOTE — Telephone Encounter (Signed)
Last ov and lab 06/2016 Last lipid 12/2015

## 2016-07-23 ENCOUNTER — Ambulatory Visit (HOSPITAL_BASED_OUTPATIENT_CLINIC_OR_DEPARTMENT_OTHER): Payer: BLUE CROSS/BLUE SHIELD

## 2016-07-23 VITALS — BP 122/77 | HR 79 | Temp 97.8°F | Resp 20

## 2016-07-23 DIAGNOSIS — D510 Vitamin B12 deficiency anemia due to intrinsic factor deficiency: Secondary | ICD-10-CM

## 2016-07-23 MED ORDER — CYANOCOBALAMIN 1000 MCG/ML IJ SOLN
1000.0000 ug | Freq: Once | INTRAMUSCULAR | Status: AC
  Administered 2016-07-23: 1000 ug via INTRAMUSCULAR

## 2016-07-23 NOTE — Patient Instructions (Signed)

## 2016-08-12 ENCOUNTER — Other Ambulatory Visit: Payer: Self-pay | Admitting: Physician Assistant

## 2016-08-12 DIAGNOSIS — I10 Essential (primary) hypertension: Secondary | ICD-10-CM

## 2016-08-12 DIAGNOSIS — E034 Atrophy of thyroid (acquired): Secondary | ICD-10-CM

## 2016-08-13 NOTE — Telephone Encounter (Signed)
SS refill req for LEVOTHYROXINE, LISINOPRIL Your note states "await labs" - did not see any notes to direct med refills. Please advise

## 2016-08-15 ENCOUNTER — Other Ambulatory Visit: Payer: Self-pay

## 2016-08-15 DIAGNOSIS — E034 Atrophy of thyroid (acquired): Secondary | ICD-10-CM

## 2016-08-15 MED ORDER — LEVOTHYROXINE SODIUM 75 MCG PO TABS: ORAL_TABLET | ORAL | 1 refills | Status: DC

## 2016-08-15 NOTE — Telephone Encounter (Signed)
Last seen 06/2016 and nl tsh 90 day supply

## 2016-09-01 ENCOUNTER — Other Ambulatory Visit: Payer: Self-pay

## 2016-09-01 DIAGNOSIS — I10 Essential (primary) hypertension: Secondary | ICD-10-CM

## 2016-09-01 MED ORDER — LISINOPRIL-HYDROCHLOROTHIAZIDE 10-12.5 MG PO TABS: 1.0000 | ORAL_TABLET | Freq: Every day | ORAL | 1 refills | Status: DC

## 2016-09-01 NOTE — Telephone Encounter (Signed)
Fax req [facility] [address] - refill Lisinopril HCTZ Per note 06/2016 - continue present regimen

## 2016-10-13 ENCOUNTER — Other Ambulatory Visit: Payer: Self-pay | Admitting: Physician Assistant

## 2016-10-13 DIAGNOSIS — E781 Pure hyperglyceridemia: Secondary | ICD-10-CM

## 2016-10-13 DIAGNOSIS — E786 Lipoprotein deficiency: Principal | ICD-10-CM

## 2016-12-12 ENCOUNTER — Other Ambulatory Visit: Payer: Self-pay | Admitting: Physician Assistant

## 2016-12-12 DIAGNOSIS — Z1231 Encounter for screening mammogram for malignant neoplasm of breast: Secondary | ICD-10-CM

## 2017-01-04 ENCOUNTER — Ambulatory Visit
Admission: RE | Admit: 2017-01-04 | Discharge: 2017-01-04 | Disposition: A | Discharge status: Home / Self Care | Payer: BLUE CROSS/BLUE SHIELD | Source: Ambulatory Visit | Attending: Physician Assistant | Admitting: Physician Assistant

## 2017-01-04 DIAGNOSIS — Z1231 Encounter for screening mammogram for malignant neoplasm of breast: Secondary | ICD-10-CM | POA: Diagnosis not present

## 2017-01-08 ENCOUNTER — Encounter: Payer: Self-pay | Admitting: Physician Assistant

## 2017-01-08 ENCOUNTER — Ambulatory Visit (INDEPENDENT_AMBULATORY_CARE_PROVIDER_SITE_OTHER): Payer: BLUE CROSS/BLUE SHIELD | Admitting: Physician Assistant

## 2017-01-08 VITALS — BP 142/89 | HR 99 | Temp 97.4°F | Resp 18 | Ht 66.5 in | Wt 257.6 lb

## 2017-01-08 DIAGNOSIS — Z0000 Encounter for general adult medical examination without abnormal findings: Secondary | ICD-10-CM | POA: Diagnosis not present

## 2017-01-08 DIAGNOSIS — Z6841 Body Mass Index (BMI) 40.0 and over, adult: Secondary | ICD-10-CM

## 2017-01-08 DIAGNOSIS — I10 Essential (primary) hypertension: Secondary | ICD-10-CM

## 2017-01-08 DIAGNOSIS — F329 Major depressive disorder, single episode, unspecified: Secondary | ICD-10-CM | POA: Diagnosis not present

## 2017-01-08 DIAGNOSIS — R739 Hyperglycemia, unspecified: Secondary | ICD-10-CM

## 2017-01-08 DIAGNOSIS — E786 Lipoprotein deficiency: Secondary | ICD-10-CM | POA: Diagnosis not present

## 2017-01-08 DIAGNOSIS — E034 Atrophy of thyroid (acquired): Secondary | ICD-10-CM | POA: Diagnosis not present

## 2017-01-08 DIAGNOSIS — E781 Pure hyperglyceridemia: Secondary | ICD-10-CM

## 2017-01-08 LAB — POCT URINALYSIS DIP (MANUAL ENTRY)
BILIRUBIN UA: NEGATIVE mg/dL
Bilirubin, UA: NEGATIVE
Blood, UA: NEGATIVE
Glucose, UA: NEGATIVE mg/dL
Nitrite, UA: NEGATIVE
PH UA: 6 (ref 5.0–8.0)
PROTEIN UA: NEGATIVE mg/dL
Spec Grav, UA: 1.025 (ref 1.010–1.025)
UROBILINOGEN UA: 0.2 U/dL

## 2017-01-08 MED ORDER — LEVOTHYROXINE SODIUM 75 MCG PO TABS: ORAL_TABLET | ORAL | 1 refills | Status: DC

## 2017-01-08 MED ORDER — SERTRALINE HCL 50 MG PO TABS: 50.0000 mg | ORAL_TABLET | Freq: Every day | ORAL | 3 refills | Status: DC

## 2017-01-08 MED ORDER — LISINOPRIL-HYDROCHLOROTHIAZIDE 10-12.5 MG PO TABS: 1.0000 | ORAL_TABLET | Freq: Every day | ORAL | 1 refills | Status: DC

## 2017-01-08 NOTE — Assessment & Plan Note (Signed)
Encouraged healthy lifestyle changes. 

## 2017-01-08 NOTE — Progress Notes (Signed)
Patient ID: Dimple Nanas, female    DOB: [birth year], 51 y.o.   MRN: [id number]  PCP: Harrison Mons, PA-C  Chief Complaint  Patient presents with  . Annual Exam    Subjective:   Presents for [employer].  Going through a lot with my parents and experiencing a lot of stress. Mother fell and broke her hip. Skin grafting at site of cellulitis, vascular ulcer. Father with dementia. Previously used sertraline with success. Would like to resume.  Cervical Cancer Screening: no history of abnormal pap test, hysterectomy due to large cyst (not malignancy), no longer a candidate. Breast Cancer Screening: 01/04/2017.  Monthly SBE. ANnual CBE. Colorectal Cancer Screening: 2017, repeat 5 years due to one polyp. Bone Density Testing: not yet HIV Screening: 2016, negative. STI Screening: very low risk Seasonal Influenza Vaccination: current, annually Td/Tdap Vaccination: 2011 Pneumococcal Vaccination: not yet a candidate Zoster Vaccination: not yet Frequency of Dental evaluation: Q6 months Frequency of Eye evaluation: annually, next visit in June  Sees Dr. Marin Olp in June for follow-up on anemia.    Patient Active Problem List   Diagnosis Date Noted  . Hx of adenomatous polyp of colon 03/08/2016  . Hyperglycemia 01/06/2016  . Abnormally low high density lipoprotein (HDL) cholesterol with hypertriglyceridemia 12/16/2014  . Obesity 12/03/2012  . HTN (hypertension) 07/30/2012  . Hypothyroidism 07/30/2012  . Depression 07/30/2012  . Pernicious anemia 07/02/2011  . 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 Anemia   . Anxiety   . History of blood transfusion   . Hx of adenomatous polyp of colon 03/08/2016  . Hypertension   . mucinous LMP tumor 06/18/2011  . Pernicious anemia 07/02/2011  . Thyroid activity decreased   . Vitamin B12 deficiency      Prior to Admission medications   Medication Sig Start Date End Date Taking?  Authorizing Provider  cholecalciferol (VITAMIN D) 1000 UNITS tablet Take 2,000 Units by mouth daily. 2000   Yes [provider]  fenofibrate 54 MG tablet TAKE 2 TABLETS BY MOUTH EVERY DAY 10/15/16  Yes Weber, Sarah L, PA-C  levothyroxine (SYNTHROID, LEVOTHROID) 75 MCG tablet TAKE 1 TABLET(75 MCG) BY MOUTH DAILY 08/15/16  Yes Noemi Ishmael, PA-C  lisinopril-hydrochlorothiazide (PRINZIDE,ZESTORETIC) 10-12.5 MG tablet Take 1 tablet by mouth daily. 09/01/16  Yes Kratos Ruscitti, PA-C  Multiple Vitamin (MULTIVITAMIN) tablet Take 1 tablet by mouth daily.     Yes [provider]  Omega-3 Fatty Acids (FISH OIL) 1000 MG CAPS Take 1,200 mg by mouth daily.    Yes [provider]  vitamin C (ASCORBIC ACID) 500 MG tablet Take 1,000 mg by mouth daily.    Yes [provider]    No Known Allergies  Past Surgical History:  Procedure Laterality Date  . ABDOMINAL HYSTERECTOMY  2009   TAHBSO  . LAPAROSCOPIC TOTAL HYSTERECTOMY Bilateral 2009   cysts    Family History  Problem Relation Age of Onset  . Adopted: Yes  . Colon cancer Neg Hx     Social History   Social History  . Marital status: Single    Spouse name: n/a  . Number of children: 0  . Years of education: 12th grade   Occupational History  . 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 Smoking status: Never Smoker  . Smokeless tobacco: Never Used  . Alcohol use No  . Drug use: No  .  Sexual activity: No   Other Topics Concern  . None   Social History Narrative   Lives with and cares for her parents.   Exercise: Walks 1-3 times a week for 30 minutes.       Review of Systems  Constitutional: Negative.   HENT: Negative.   Eyes: Negative.   Respiratory: Negative.   Cardiovascular: Negative.   Gastrointestinal: Negative.   Endocrine: Negative.   Genitourinary: Negative.   Musculoskeletal: Negative.   Skin: Negative.   Allergic/Immunologic:  Negative.   Neurological: Negative.   Hematological: Negative.   Psychiatric/Behavioral: Positive for decreased concentration and dysphoric mood. Negative for agitation, behavioral problems, confusion, hallucinations, self-injury, sleep disturbance and suicidal ideas. The patient is nervous/anxious. The patient is not hyperactive.         Objective:  Physical Exam  Constitutional: She is oriented to person, place, and time. Vital signs are normal. She appears well-developed and well-nourished. She is active and cooperative. No distress.  BP (!) 142/89   Pulse 99   Temp 97.4 F (36.3 C) (Oral)   Resp 18   Ht 5' 6.5" (1.689 m)   Wt 257 lb 9.6 oz (116.8 kg)   SpO2 98%   BMI 40.95 kg/m    HENT:  Head: Normocephalic and atraumatic.  Right Ear: Hearing, tympanic membrane, external ear and ear canal normal. No foreign bodies.  Left Ear: Hearing, tympanic membrane, external ear and ear canal normal. No foreign bodies.  Nose: Nose normal.  Mouth/Throat: Uvula is midline, oropharynx is clear and moist and mucous membranes are normal. No oral lesions. Normal dentition. No dental abscesses or uvula swelling. No oropharyngeal exudate.  Eyes: Conjunctivae, EOM and lids are normal. Pupils are equal, round, and reactive to light. Right eye exhibits no discharge. Left eye exhibits no discharge. No scleral icterus.  Fundoscopic exam:      The right eye shows no arteriolar narrowing, no AV nicking, no exudate, no hemorrhage and no papilledema. The right eye shows red reflex.       The left eye shows no arteriolar narrowing, no AV nicking, no exudate, no hemorrhage and no papilledema. The left eye shows red reflex.  Neck: Trachea normal, normal range of motion and full passive range of motion without pain. Neck supple. No spinous process tenderness and no muscular tenderness present. No thyroid mass and no thyromegaly present.  Cardiovascular: Normal rate, regular rhythm, normal heart sounds, intact  distal pulses and normal pulses.   Pulmonary/Chest: Effort normal and breath sounds normal. Right breast exhibits no inverted nipple, no mass, no nipple discharge, no skin change and no tenderness. Left breast exhibits no inverted nipple, no mass, no nipple discharge, no skin change and no tenderness. Breasts are symmetrical.  Musculoskeletal: She exhibits no edema or tenderness.       Cervical back: Normal.       Thoracic back: Normal.       Lumbar back: Normal.  Lymphadenopathy:       Head (right side): No tonsillar, no preauricular, no posterior auricular and no occipital adenopathy present.       Head (left side): No tonsillar, no preauricular, no posterior auricular and no occipital adenopathy present.    She has no cervical adenopathy.       Right: No supraclavicular adenopathy present.       Left: No supraclavicular adenopathy present.  Neurological: She is alert and oriented to person, place, and time. She has normal strength and normal reflexes. No cranial nerve  deficit. She exhibits normal muscle tone. Coordination and gait normal.  Skin: Skin is warm, dry and intact. No rash noted. She is not diaphoretic. No cyanosis or erythema. Nails show no clubbing.  Psychiatric: She has a normal mood and affect. Her speech is normal and behavior is normal. Judgment and thought content normal.       Assessment & Plan:   Problem List Items Addressed This Visit    HTN (hypertension)    Slightly above goal today. Intended to recheck, but patient left. Await lab results and will ask that she return for recheck.      Relevant Medications   lisinopril-hydrochlorothiazide (PRINZIDE,ZESTORETIC) 10-12.5 MG tablet   Other Relevant Orders   Comprehensive metabolic panel   POCT urinalysis dipstick (Completed)   Hypothyroidism    Await labs. Adjust regimen as indicated by results.       Relevant Medications   levothyroxine (SYNTHROID, LEVOTHROID) 75 MCG tablet   Other Relevant Orders   TSH    T4, free   Depression    Exacerbated by recent stress around caring for her parents in their health decline. Resume sertraline. Let me know how she is doing in 4-6 weeks.      Relevant Medications   sertraline (ZOLOFT) 50 MG tablet   BMI 40.0-44.9, adult ([place])    Encouraged healthy lifestyle changes.      Abnormally low high density lipoprotein (HDL) cholesterol with hypertriglyceridemia    Update labs today and consider treatment.      Relevant Medications   lisinopril-hydrochlorothiazide (PRINZIDE,ZESTORETIC) 10-12.5 MG tablet   Other Relevant Orders   Lipid panel   Hyperglycemia    A1C has been <6%. Await lab results.      Relevant Orders   Comprehensive metabolic panel   Hemoglobin A1c    Other Visit Diagnoses    Annual physical exam    -  Primary   Age appropriate health guidance provided.       Return in about 1 year (around 01/08/2018), or if symptoms worsen or fail to improve.   Fara Chute, PA-C [facility]

## 2017-01-08 NOTE — Assessment & Plan Note (Signed)
A1C has been <6%. Await lab results.

## 2017-01-08 NOTE — Assessment & Plan Note (Signed)
Update labs today and consider treatment.

## 2017-01-08 NOTE — Assessment & Plan Note (Signed)
Await labs. Adjust regimen as indicated by results.  

## 2017-01-08 NOTE — Assessment & Plan Note (Signed)
Slightly above goal today. Intended to recheck, but patient left. Await lab results and will ask that she return for recheck.

## 2017-01-08 NOTE — Patient Instructions (Addendum)
Starting the sertraline, take 1/2 tablet each day for the first week, then increase to the whole tablet each day. Let me know how things are going in 4-6 weeks. We can increase the dose if needed.    IF you received an x-ray today, you will receive an invoice from [facility]. Please contact [facility] at [phone] with questions or concerns regarding your invoice.   IF you received labwork today, you will receive an invoice from [facility]. Please contact [facility] at [phone] with questions or concerns regarding your invoice.   Our billing staff will not be able to assist you with questions regarding bills from these companies.  You will be contacted with the lab results as soon as they are available. The fastest way to get your results is to activate your My Chart account. Instructions are located on the last page of this paperwork. If you have not heard from [place] regarding the results in 2 weeks, please contact this office.    Keeping You Healthy  Get These Tests  Blood Pressure- Have your blood pressure checked by your healthcare provider at least once a year.  Normal blood pressure is 120/80.  Weight- Have your body mass index (BMI) calculated to screen for obesity.  BMI is a measure of body fat based on height and weight.  You can calculate your own BMI at [employer].it  Cholesterol- Have your cholesterol checked every year.  Diabetes- Have your blood sugar checked every year if you have high blood pressure, high cholesterol, a family history of diabetes or if you are overweight.  Pap Test - Have a pap test every 1 to 5 years if you have been sexually active.  If you are older than 65 and recent pap tests have been normal you may not need additional pap tests.  In addition, if you have had a hysterectomy  for benign disease additional pap tests are not necessary.  Mammogram-Yearly mammograms are essential for early detection of breast  cancer  Screening for Colon Cancer- Colonoscopy starting at age 64. Screening may begin sooner depending on your family history and other health conditions.  Follow up colonoscopy as directed by your Gastroenterologist.  Screening for Osteoporosis- Screening begins at age 73 with bone density scanning, sooner if you are at higher risk for developing Osteoporosis.  Get these medicines  Calcium with Vitamin D- Your body requires 1200-1500 mg of Calcium a day and [phone] IU of Vitamin D a day.  You can only absorb 500 mg of Calcium at a time therefore Calcium must be taken in 2 or 3 separate doses throughout the day.  Hormones- Hormone therapy has been associated with increased risk for certain cancers and heart disease.  Talk to your healthcare provider about if you need relief from menopausal symptoms.  Aspirin- Ask your healthcare provider about taking Aspirin to prevent Heart Disease and Stroke.  Get these Immuniztions  Flu shot- Every fall  Pneumonia shot- Once after the age of 86; if you are younger ask your healthcare provider if you need a pneumonia shot.  Tetanus- Every ten years.  Zostavax- Once after the age of 68 to prevent shingles.  Take these steps  Don't smoke- Your healthcare provider can help you quit. For tips on how to quit, ask your healthcare provider or go to www.smokefree.gov or call 1-800 QUIT-NOW.  Be physically active- Exercise 5 days a week for a minimum of 30 minutes.  If you are not already physically active, start slow and  gradually work up to 30 minutes of moderate physical activity.  Try walking, dancing, bike riding, swimming, etc.  Eat a healthy diet- Eat a variety of healthy foods such as fruits, vegetables, whole grains, low fat milk, low fat cheeses, yogurt, lean meats, chicken, fish, eggs, dried beans, tofu, etc.  For more information go to www.thenutritionsource.org  Dental visit- Brush and floss teeth twice daily; visit your dentist twice a  year.  Eye exam- Visit your Optometrist or Ophthalmologist yearly.  Drink alcohol in moderation- Limit alcohol intake to one drink or less a day.  Never drink and drive.  Depression- Your emotional health is as important as your physical health.  If you're feeling down or losing interest in things you normally enjoy, please talk to your healthcare provider.  Seat Belts- can save your life; always wear one  Smoke/Carbon Monoxide detectors- These detectors need to be installed on the appropriate level of your home.  Replace batteries at least once a year.  Violence- If anyone is threatening or hurting you, please tell your healthcare provider.  Living Will/ Health care power of attorney- Discuss with your healthcare provider and family.

## 2017-01-08 NOTE — Assessment & Plan Note (Signed)
Exacerbated by recent stress around caring for her parents in their health decline. Resume sertraline. Let me know how she is doing in 4-6 weeks.

## 2017-01-09 ENCOUNTER — Encounter: Payer: Self-pay | Admitting: Physician Assistant

## 2017-01-09 LAB — COMPREHENSIVE METABOLIC PANEL
A/G RATIO: 2 (ref 1.2–2.2)
ALK PHOS: 66 IU/L (ref 39–117)
ALT: 40 IU/L — AB (ref 0–32)
AST: 34 IU/L (ref 0–40)
Albumin: 4.9 g/dL (ref 3.5–5.5)
BILIRUBIN TOTAL: 0.4 mg/dL (ref 0.0–1.2)
BUN / CREAT RATIO: 21 (ref 9–23)
BUN: 18 mg/dL (ref 6–24)
CHLORIDE: 103 mmol/L (ref 96–106)
CO2: 21 mmol/L (ref 18–29)
Calcium: 10 mg/dL (ref 8.7–10.2)
Creatinine, Ser: 0.87 mg/dL (ref 0.57–1.00)
GFR calc non Af Amer: 77 mL/min/{1.73_m2} (ref >59)
GFR, EST AFRICAN AMERICAN: 89 mL/min/{1.73_m2} (ref >59)
Globulin, Total: 2.5 g/dL (ref 1.5–4.5)
Glucose: 135 mg/dL — ABNORMAL HIGH (ref 65–99)
Potassium: 4.1 mmol/L (ref 3.5–5.2)
Sodium: 140 mmol/L (ref 134–144)
TOTAL PROTEIN: 7.4 g/dL (ref 6.0–8.5)

## 2017-01-09 LAB — LIPID PANEL
CHOL/HDL RATIO: 4.4 ratio (ref 0.0–4.4)
CHOLESTEROL TOTAL: 155 mg/dL (ref 100–199)
HDL: 35 mg/dL — ABNORMAL LOW (ref >39)
LDL CALC: 93 mg/dL (ref 0–99)
Triglycerides: 136 mg/dL (ref 0–149)
VLDL Cholesterol Cal: 27 mg/dL (ref 5–40)

## 2017-01-09 LAB — T4, FREE: FREE T4: 1.44 ng/dL (ref 0.82–1.77)

## 2017-01-09 LAB — HEMOGLOBIN A1C
Est. average glucose Bld gHb Est-mCnc: 151 mg/dL
HEMOGLOBIN A1C: 6.9 % — AB (ref 4.8–5.6)

## 2017-01-09 LAB — TSH: TSH: 3.59 u[IU]/mL (ref 0.450–4.500)

## 2017-01-09 NOTE — Addendum Note (Signed)
Addended by: Fara Chute on: 01/09/2017 12:29 PM   Modules accepted: Orders

## 2017-01-12 ENCOUNTER — Other Ambulatory Visit: Payer: Self-pay | Admitting: Physician Assistant

## 2017-01-12 DIAGNOSIS — E786 Lipoprotein deficiency: Principal | ICD-10-CM

## 2017-01-12 DIAGNOSIS — E781 Pure hyperglyceridemia: Secondary | ICD-10-CM

## 2017-01-18 ENCOUNTER — Ambulatory Visit (HOSPITAL_BASED_OUTPATIENT_CLINIC_OR_DEPARTMENT_OTHER): Payer: BLUE CROSS/BLUE SHIELD

## 2017-01-18 ENCOUNTER — Ambulatory Visit: Payer: BLUE CROSS/BLUE SHIELD

## 2017-01-18 ENCOUNTER — Ambulatory Visit (HOSPITAL_BASED_OUTPATIENT_CLINIC_OR_DEPARTMENT_OTHER): Payer: BLUE CROSS/BLUE SHIELD | Admitting: Hematology & Oncology

## 2017-01-18 ENCOUNTER — Other Ambulatory Visit (HOSPITAL_BASED_OUTPATIENT_CLINIC_OR_DEPARTMENT_OTHER): Payer: BLUE CROSS/BLUE SHIELD

## 2017-01-18 VITALS — BP 125/56 | HR 86 | Temp 98.1°F | Resp 18 | Wt 257.0 lb

## 2017-01-18 DIAGNOSIS — D510 Vitamin B12 deficiency anemia due to intrinsic factor deficiency: Secondary | ICD-10-CM

## 2017-01-18 DIAGNOSIS — E039 Hypothyroidism, unspecified: Secondary | ICD-10-CM

## 2017-01-18 LAB — CMP (CANCER CENTER ONLY)
ALT(SGPT): 47 U/L (ref 10–47)
AST: 36 U/L (ref 11–38)
Albumin: 4.3 g/dL (ref 3.3–5.5)
Alkaline Phosphatase: 55 U/L (ref 26–84)
BUN: 13 mg/dL (ref 7–22)
CALCIUM: 9.6 mg/dL (ref 8.0–10.3)
CHLORIDE: 104 meq/L (ref 98–108)
CO2: 26 mEq/L (ref 18–33)
Creat: 0.8 mg/dl (ref 0.6–1.2)
GLUCOSE: 149 mg/dL — AB (ref 73–118)
POTASSIUM: 3.9 meq/L (ref 3.3–4.7)
Sodium: 139 mEq/L (ref 128–145)
Total Bilirubin: 0.7 mg/dl (ref 0.20–1.60)
Total Protein: 7.3 g/dL (ref 6.4–8.1)

## 2017-01-18 LAB — CBC WITH DIFFERENTIAL (CANCER CENTER ONLY)
BASO#: 0 10*3/uL (ref 0.0–0.2)
BASO%: 0.5 % (ref 0.0–2.0)
EOS ABS: 0.1 10*3/uL (ref 0.0–0.5)
EOS%: 1.3 % (ref 0.0–7.0)
HCT: 42.8 % (ref 34.8–46.6)
HGB: 14.7 g/dL (ref 11.6–15.9)
LYMPH#: 1.4 10*3/uL (ref 0.9–3.3)
LYMPH%: 22.3 % (ref 14.0–48.0)
MCH: 28.3 pg (ref 26.0–34.0)
MCHC: 34.3 g/dL (ref 32.0–36.0)
MCV: 83 fL (ref 81–101)
MONO#: 0.6 10*3/uL (ref 0.1–0.9)
MONO%: 8.8 % (ref 0.0–13.0)
NEUT#: 4.2 10*3/uL (ref 1.5–6.5)
NEUT%: 67.1 % (ref 39.6–80.0)
PLATELETS: 166 10*3/uL (ref 145–400)
RBC: 5.19 10*6/uL (ref 3.70–5.32)
RDW: 13.9 % (ref 11.1–15.7)
WBC: 6.3 10*3/uL (ref 3.9–10.0)

## 2017-01-18 LAB — CHCC SATELLITE - SMEAR

## 2017-01-18 MED ORDER — CYANOCOBALAMIN 1000 MCG/ML IJ SOLN
INTRAMUSCULAR | Status: AC
  Filled 2017-01-18: qty 1

## 2017-01-18 MED ORDER — CYANOCOBALAMIN 1000 MCG/ML IJ SOLN
1000.0000 ug | Freq: Once | INTRAMUSCULAR | Status: AC
  Administered 2017-01-18: 1000 ug via INTRAMUSCULAR

## 2017-01-18 NOTE — Progress Notes (Signed)
Hematology and Oncology Follow Up Visit  [name] [id number] Jan 06, 1966 51 y.o. 01/18/2017   Principle Diagnosis:   Pernicious anemia-  anti-parietal cell antibody  Hypothyroidism  Current Therapy:    Vitamin B-12 1000 g IM every 6 months     Interim History:  [name] is back for follow-up. We see her once a year ourselves. She gets her B-12 shots every 6 months. She's doing quite well.   She did have her colonoscopy back in July of last year. She was found to have a 6 mm tubular adenoma. This was not high-grade. I would think that she probably will need another colonoscopy in 3-5 years.   She is still taking care of her parents. They're becoming more elderly. There began to have more health issues.   Her last mammogram was a week ago. This all came out fine.   She has not been able go to the beach. Apparently, the beach house that she uses is no longer available.   She had no proms over the wintertime. She had no fever. She had no influenza. She had no change in bowel or bladder habits.   hes. She's had no cough. She's had no nausea vomiting. She's had no leg swelling.   Medications:  Current Outpatient Prescriptions:  .  cholecalciferol (VITAMIN D) 1000 UNITS tablet, Take 2,000 Units by mouth daily. 2000, Disp: , Rfl:  .  fenofibrate 54 MG tablet, TAKE 2 TABLETS BY MOUTH EVERY DAY, Disp: 180 tablet, Rfl: 1 .  levothyroxine (SYNTHROID, LEVOTHROID) 75 MCG tablet, TAKE 1 TABLET(75 MCG) BY MOUTH DAILY, Disp: 90 tablet, Rfl: 1 .  lisinopril-hydrochlorothiazide (PRINZIDE,ZESTORETIC) 10-12.5 MG tablet, Take 1 tablet by mouth daily., Disp: 90 tablet, Rfl: 1 .  Multiple Vitamin (MULTIVITAMIN) tablet, Take 1 tablet by mouth daily.  , Disp: , Rfl:  .  Omega-3 Fatty Acids (FISH OIL) 1000 MG CAPS, Take 1,200 mg by mouth daily. , Disp: , Rfl:  .  sertraline (ZOLOFT) 50 MG tablet, Take 1 tablet (50 mg total) by mouth daily., Disp: 90 tablet, Rfl: 3 .  vitamin C (ASCORBIC  ACID) 500 MG tablet, Take 1,000 mg by mouth daily. , Disp: , Rfl:   Allergies: No Known Allergies  Past Medical History, Surgical history, Social history, and Family History were reviewed and updated.  Review of Systems: As above  Physical Exam:  weight is 257 lb (116.6 kg). Her oral temperature is 98.1 F (36.7 C). Her blood pressure is 125/56 (abnormal) and her pulse is 86. Her respiration is 18 and oxygen saturation is 94%.   Wt Readings from Last 3 Encounters:  01/18/17 257 lb (116.6 kg)  01/08/17 257 lb 9.6 oz (116.8 kg)  07/10/16 256 lb (116.1 kg)     Obese white female in no obvious distress. Head and neck exam shows no ocular or oral lesions. There are no palpable cervical or supraclavicular lymph nodes. Lungs are clear. Cardiac exam regular rate and rhythm with no murmurs, rubs or bruits. Abdomen is soft. She has good bowel sounds. There is no fluid wave. There is no palpable liver or spleen tip. Back exam shows no tenderness over the spine, ribs or hips. Extremities shows no clubbing, cyanosis or edema. Neurological exam shows no focal neurological deficits. Skin exam shows no rashes, ecchymoses or petechia.  Lab Results  Component Value Date   WBC 6.3 01/18/2017   HGB 14.7 01/18/2017   HCT 42.8 01/18/2017   MCV 83 01/18/2017   PLT  166 01/18/2017     Chemistry      Component Value Date/Time   NA 139 01/18/2017 0832   NA 139 01/20/2016 0811   K 3.9 01/18/2017 0832   K 3.9 01/20/2016 0811   CL 104 01/18/2017 0832   CO2 26 01/18/2017 0832   CO2 24 01/20/2016 0811   BUN 13 01/18/2017 0832   BUN 15.6 01/20/2016 0811   CREATININE 0.8 01/18/2017 0832   CREATININE 0.9 01/20/2016 0811      Component Value Date/Time   CALCIUM 9.6 01/18/2017 0832   CALCIUM 9.9 01/20/2016 0811   ALKPHOS 55 01/18/2017 0832   ALKPHOS 57 01/20/2016 0811   AST 36 01/18/2017 0832   AST 24 01/20/2016 0811   ALT 47 01/18/2017 0832   ALT 35 01/20/2016 0811   BILITOT 0.70 01/18/2017 0832    BILITOT 0.39 01/20/2016 0811         Impression and Plan: [name] is a 51 year old white female. She has pernicious anemia. She has anti-parietal cell antibodies. She presented back in 2001.  From my point of view, everything looks great. I don't see any issues with respect to her blood.  We will plan for another 1 year follow-up.   In 6 months, she will get her vitamin B-12 injection at the main cancer [facility].  We will pray for her parents. I know that they are becoming more feeble.   Volanda Napoleon, MD 6/8/20189:14 AM

## 2017-01-18 NOTE — Patient Instructions (Signed)
Cyanocobalamin, Vitamin B12 injection What is this medicine? CYANOCOBALAMIN (sye an oh koe BAL a min) is a man made form of vitamin B12. Vitamin B12 is used in the growth of healthy blood cells, nerve cells, and proteins in the body. It also helps with the metabolism of fats and carbohydrates. This medicine is used to treat people who can not absorb vitamin B12. This medicine may be used for other purposes; ask your health care provider or pharmacist if you have questions. COMMON BRAND NAME(S): B-12 Compliance Kit, B-12 Injection Kit, Cyomin, LA-12, Nutri-Twelve, Physicians EZ Use B-12, Primabalt What should I tell my health care provider before I take this medicine? They need to know if you have any of these conditions: -kidney disease -Leber's disease -megaloblastic anemia -an unusual or allergic reaction to cyanocobalamin, cobalt, other medicines, foods, dyes, or preservatives -pregnant or trying to get pregnant -breast-feeding How should I use this medicine? This medicine is injected into a muscle or deeply under the skin. It is usually given by a health care professional in a clinic or doctor's office. However, your doctor may teach you how to inject yourself. Follow all instructions. Talk to your pediatrician regarding the use of this medicine in children. Special care may be needed. Overdosage: If you think you have taken too much of this medicine contact a poison control center or emergency room at once. NOTE: This medicine is only for you. Do not share this medicine with others. What if I miss a dose? If you are given your dose at a clinic or doctor's office, call to reschedule your appointment. If you give your own injections and you miss a dose, take it as soon as you can. If it is almost time for your next dose, take only that dose. Do not take double or extra doses. What may interact with this medicine? -colchicine -heavy alcohol intake This list may not describe all possible  interactions. Give your health care provider a list of all the medicines, herbs, non-prescription drugs, or dietary supplements you use. Also tell them if you smoke, drink alcohol, or use illegal drugs. Some items may interact with your medicine. What should I watch for while using this medicine? Visit your doctor or health care professional regularly. You may need blood work done while you are taking this medicine. You may need to follow a special diet. Talk to your doctor. Limit your alcohol intake and avoid smoking to get the best benefit. What side effects may I notice from receiving this medicine? Side effects that you should report to your doctor or health care professional as soon as possible: -allergic reactions like skin rash, itching or hives, swelling of the face, lips, or tongue -blue tint to skin -chest tightness, pain -difficulty breathing, wheezing -dizziness -red, swollen painful area on the leg Side effects that usually do not require medical attention (report to your doctor or health care professional if they continue or are bothersome): -diarrhea -headache This list may not describe all possible side effects. Call your doctor for medical advice about side effects. You may report side effects to FDA at 1-800-FDA-1088. Where should I keep my medicine? Keep out of the reach of children. Store at room temperature between 15 and 30 degrees C (59 and 85 degrees F). Protect from light. Throw away any unused medicine after the expiration date. NOTE: This sheet is a summary. It may not cover all possible information. If you have questions about this medicine, talk to your doctor, pharmacist, or   health care provider.  2018 Elsevier/Gold Standard (2007-11-10 22:10:20)  

## 2017-01-19 LAB — VITAMIN B12: Vitamin B12: 344 pg/mL (ref 232–1245)

## 2017-02-04 ENCOUNTER — Encounter: Payer: Self-pay | Admitting: Physician Assistant

## 2017-02-04 DIAGNOSIS — H35411 Lattice degeneration of retina, right eye: Secondary | ICD-10-CM | POA: Diagnosis not present

## 2017-02-04 DIAGNOSIS — H35031 Hypertensive retinopathy, right eye: Secondary | ICD-10-CM | POA: Diagnosis not present

## 2017-02-04 DIAGNOSIS — H35033 Hypertensive retinopathy, bilateral: Secondary | ICD-10-CM | POA: Diagnosis not present

## 2017-02-04 DIAGNOSIS — H35412 Lattice degeneration of retina, left eye: Secondary | ICD-10-CM | POA: Diagnosis not present

## 2017-02-04 DIAGNOSIS — H25013 Cortical age-related cataract, bilateral: Secondary | ICD-10-CM | POA: Diagnosis not present

## 2017-02-04 DIAGNOSIS — H2513 Age-related nuclear cataract, bilateral: Secondary | ICD-10-CM | POA: Diagnosis not present

## 2017-02-04 DIAGNOSIS — H35032 Hypertensive retinopathy, left eye: Secondary | ICD-10-CM | POA: Diagnosis not present

## 2017-02-04 DIAGNOSIS — H35413 Lattice degeneration of retina, bilateral: Secondary | ICD-10-CM | POA: Diagnosis not present

## 2017-02-05 DIAGNOSIS — Z23 Encounter for immunization: Secondary | ICD-10-CM | POA: Diagnosis not present

## 2017-02-05 MED ORDER — ZOSTER VAC RECOMB ADJUVANTED 50 MCG/0.5ML IM SUSR: 0.5000 mL | Freq: Once | INTRAMUSCULAR | 1 refills | Status: AC

## 2017-02-19 ENCOUNTER — Ambulatory Visit: Payer: BLUE CROSS/BLUE SHIELD | Admitting: *Deleted

## 2017-02-20 NOTE — Progress Notes (Unsigned)
[facility] Provider: Hortencia Pilar, M.D. Problem List 1. Cataracts OU - Mild, not visually significant - Monitor - The patient has early cataracts that are not significantly affecting their vision at this time. The patient is able to perform desired activities without limitation due to cataracts. The patient should return for regular follow-up visits as scheduled with their primary eye care providers. Diagnosis and follow up recommendations have been discussed with the patient. 2. Hypersensitive Retinopathy OU - Stable - Advised patient to monitor cholesterol levels and blood pressure and to continue any cholesterol medications and or/ blood pressure mediations. - Advised to disuss blood pressure control with PCP

## 2017-02-27 ENCOUNTER — Ambulatory Visit: Payer: BLUE CROSS/BLUE SHIELD | Admitting: *Deleted

## 2017-04-08 DIAGNOSIS — Z23 Encounter for immunization: Secondary | ICD-10-CM | POA: Diagnosis not present

## 2017-07-06 ENCOUNTER — Other Ambulatory Visit: Payer: Self-pay | Admitting: Physician Assistant

## 2017-07-06 DIAGNOSIS — E781 Pure hyperglyceridemia: Secondary | ICD-10-CM

## 2017-07-06 DIAGNOSIS — E786 Lipoprotein deficiency: Principal | ICD-10-CM

## 2017-07-08 ENCOUNTER — Telehealth: Payer: Self-pay | Admitting: Physician Assistant

## 2017-07-08 ENCOUNTER — Other Ambulatory Visit: Payer: Self-pay

## 2017-07-08 DIAGNOSIS — E781 Pure hyperglyceridemia: Secondary | ICD-10-CM

## 2017-07-08 DIAGNOSIS — E786 Lipoprotein deficiency: Principal | ICD-10-CM

## 2017-07-08 MED ORDER — FENOFIBRATE 54 MG PO TABS: 108.0000 mg | ORAL_TABLET | Freq: Every day | ORAL | 1 refills | Status: DC

## 2017-07-08 NOTE — Telephone Encounter (Signed)
Copied from [place] [phone]. Topic: Inquiry >> Jul 08, 2017  4:21 PM Oliver Pila B wrote: Reason for CRM: pt is calling and is needing her Rx of  fenofibrate refilled, pharmacy told pt to contact practice b/c they couldn't fill it that quickly b/c pt is going out of town, contact pt if needed

## 2017-07-08 NOTE — Telephone Encounter (Signed)
Done and pt advised.

## 2017-07-22 ENCOUNTER — Ambulatory Visit: Payer: BLUE CROSS/BLUE SHIELD

## 2017-07-26 ENCOUNTER — Ambulatory Visit: Payer: BLUE CROSS/BLUE SHIELD | Admitting: Physician Assistant

## 2017-07-29 ENCOUNTER — Ambulatory Visit (HOSPITAL_BASED_OUTPATIENT_CLINIC_OR_DEPARTMENT_OTHER): Payer: BLUE CROSS/BLUE SHIELD

## 2017-07-29 VITALS — BP 140/80 | HR 77 | Temp 97.8°F | Resp 18

## 2017-07-29 DIAGNOSIS — D510 Vitamin B12 deficiency anemia due to intrinsic factor deficiency: Secondary | ICD-10-CM | POA: Diagnosis not present

## 2017-07-29 MED ORDER — CYANOCOBALAMIN 1000 MCG/ML IJ SOLN
1000.0000 ug | Freq: Once | INTRAMUSCULAR | Status: AC
  Administered 2017-07-29: 1000 ug via INTRAMUSCULAR

## 2017-07-29 NOTE — Patient Instructions (Signed)
Cyanocobalamin, Vitamin B12 injection What is this medicine? CYANOCOBALAMIN (sye an oh koe BAL a min) is a man made form of vitamin B12. Vitamin B12 is used in the growth of healthy blood cells, nerve cells, and proteins in the body. It also helps with the metabolism of fats and carbohydrates. This medicine is used to treat people who can not absorb vitamin B12. This medicine may be used for other purposes; ask your health care provider or pharmacist if you have questions. COMMON BRAND NAME(S): B-12 Compliance Kit, B-12 Injection Kit, Cyomin, LA-12, Nutri-Twelve, Physicians EZ Use B-12, Primabalt What should I tell my health care provider before I take this medicine? They need to know if you have any of these conditions: -kidney disease -Leber's disease -megaloblastic anemia -an unusual or allergic reaction to cyanocobalamin, cobalt, other medicines, foods, dyes, or preservatives -pregnant or trying to get pregnant -breast-feeding How should I use this medicine? This medicine is injected into a muscle or deeply under the skin. It is usually given by a health care professional in a clinic or doctor's office. However, your doctor may teach you how to inject yourself. Follow all instructions. Talk to your pediatrician regarding the use of this medicine in children. Special care may be needed. Overdosage: If you think you have taken too much of this medicine contact a poison control center or emergency room at once. NOTE: This medicine is only for you. Do not share this medicine with others. What if I miss a dose? If you are given your dose at a clinic or doctor's office, call to reschedule your appointment. If you give your own injections and you miss a dose, take it as soon as you can. If it is almost time for your next dose, take only that dose. Do not take double or extra doses. What may interact with this medicine? -colchicine -heavy alcohol intake This list may not describe all possible  interactions. Give your health care provider a list of all the medicines, herbs, non-prescription drugs, or dietary supplements you use. Also tell them if you smoke, drink alcohol, or use illegal drugs. Some items may interact with your medicine. What should I watch for while using this medicine? Visit your doctor or health care professional regularly. You may need blood work done while you are taking this medicine. You may need to follow a special diet. Talk to your doctor. Limit your alcohol intake and avoid smoking to get the best benefit. What side effects may I notice from receiving this medicine? Side effects that you should report to your doctor or health care professional as soon as possible: -allergic reactions like skin rash, itching or hives, swelling of the face, lips, or tongue -blue tint to skin -chest tightness, pain -difficulty breathing, wheezing -dizziness -red, swollen painful area on the leg Side effects that usually do not require medical attention (report to your doctor or health care professional if they continue or are bothersome): -diarrhea -headache This list may not describe all possible side effects. Call your doctor for medical advice about side effects. You may report side effects to FDA at 1-800-FDA-1088. Where should I keep my medicine? Keep out of the reach of children. Store at room temperature between 15 and 30 degrees C (59 and 85 degrees F). Protect from light. Throw away any unused medicine after the expiration date. NOTE: This sheet is a summary. It may not cover all possible information. If you have questions about this medicine, talk to your doctor, pharmacist, or   health care provider.  2018 Elsevier/Gold Standard (2007-11-10 22:10:20)  

## 2017-07-30 ENCOUNTER — Other Ambulatory Visit: Payer: Self-pay

## 2017-07-30 ENCOUNTER — Ambulatory Visit: Payer: BLUE CROSS/BLUE SHIELD | Admitting: Physician Assistant

## 2017-07-30 ENCOUNTER — Encounter: Payer: Self-pay | Admitting: Physician Assistant

## 2017-07-30 VITALS — BP 132/84 | HR 116 | Temp 98.1°F | Resp 18 | Ht 66.5 in | Wt 253.8 lb

## 2017-07-30 DIAGNOSIS — F329 Major depressive disorder, single episode, unspecified: Secondary | ICD-10-CM

## 2017-07-30 DIAGNOSIS — E119 Type 2 diabetes mellitus without complications: Secondary | ICD-10-CM | POA: Diagnosis not present

## 2017-07-30 DIAGNOSIS — I10 Essential (primary) hypertension: Secondary | ICD-10-CM | POA: Diagnosis not present

## 2017-07-30 DIAGNOSIS — E786 Lipoprotein deficiency: Secondary | ICD-10-CM | POA: Diagnosis not present

## 2017-07-30 DIAGNOSIS — E034 Atrophy of thyroid (acquired): Secondary | ICD-10-CM | POA: Diagnosis not present

## 2017-07-30 DIAGNOSIS — E781 Pure hyperglyceridemia: Secondary | ICD-10-CM | POA: Diagnosis not present

## 2017-07-30 NOTE — Patient Instructions (Signed)
     IF you received an x-ray today, you will receive an invoice from [facility]. Please contact [facility] at [phone] with questions or concerns regarding your invoice.   IF you received labwork today, you will receive an invoice from [facility]. Please contact [facility] at [phone] with questions or concerns regarding your invoice.   Our billing staff will not be able to assist you with questions regarding bills from these companies.  You will be contacted with the lab results as soon as they are available. The fastest way to get your results is to activate your My Chart account. Instructions are located on the last page of this paperwork. If you have not heard from us regarding the results in 2 weeks, please contact this office.     

## 2017-07-30 NOTE — Progress Notes (Signed)
Patient ID: [name], female    DOB: 10/11/1965, 51 y.o.   MRN: [id number]  PCP: Harrison Mons, PA-C  Chief Complaint  Patient presents with  . Hypertension    Pt states she checks her BP everyday at home and her normal is 117/78  . Hypothyroidism  . Hyperglycemia  . Anxiety    Pt states she is doing good with the Zoloft.  . Follow-up    Subjective:   Presents for evaluation of HTN, hyperglycemia, hypothyroidism and anxiety.  Overall, she feels well. Tolerates her medications without difficulty.  Home BP is NORMAL. Home pulse ranges 65-70.  Anxiety is better, primarily centered around care for her parents. She recently took them to [place] for her nephew's wedding. That was both enjoyable and stressful, she's glad she did it.  Review of Systems  Constitutional: Negative for activity change, appetite change, fatigue and unexpected weight change.  HENT: Negative for congestion, dental problem, ear pain, hearing loss, mouth sores, postnasal drip, rhinorrhea, sneezing, sore throat, tinnitus and trouble swallowing.   Eyes: Negative for photophobia, pain, redness and visual disturbance.  Respiratory: Negative for cough, chest tightness and shortness of breath.   Cardiovascular: Negative for chest pain, palpitations and leg swelling.  Gastrointestinal: Negative for abdominal pain, blood in stool, constipation, diarrhea, nausea and vomiting.  Endocrine: Negative for cold intolerance, heat intolerance, polydipsia, polyphagia and polyuria.  Genitourinary: Negative for dysuria, frequency, hematuria and urgency.  Musculoskeletal: Negative for arthralgias, gait problem, myalgias and neck stiffness.  Skin: Negative for rash.  Neurological: Negative for dizziness, speech difficulty, weakness, light-headedness, numbness and headaches.  Hematological: Negative for adenopathy.  Psychiatric/Behavioral: Negative for confusion and sleep disturbance. The patient is not  nervous/anxious.      GAD 7 : Generalized Anxiety Score 07/30/2017  Nervous, Anxious, on Edge 0  Control/stop worrying 0  Worry too much - different things 1  Trouble relaxing 0  Restless 0  Easily annoyed or irritable 1  Afraid - awful might happen 0  Total GAD 7 Score 2  Anxiety Difficulty Not difficult at all       Patient Active Problem List   Diagnosis Date Noted  . Hx of adenomatous polyp of colon 03/08/2016  . Diabetes mellitus type 2, uncomplicated ([place]) 13/24/4010  . Abnormally low high density lipoprotein (HDL) cholesterol with hypertriglyceridemia 12/16/2014  . BMI 40.0-44.9, adult ([place]) 12/03/2012  . HTN (hypertension) 07/30/2012  . Hypothyroidism 07/30/2012  . Depression 07/30/2012  . Pernicious anemia 07/02/2011  . 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 Authorizing Provider  cholecalciferol (VITAMIN D) 1000 UNITS tablet Take 2,000 Units by mouth daily. 2000   Yes [provider]  fenofibrate 54 MG tablet Take 2 tablets (108 mg total) by mouth daily. 07/08/17  Yes Zeyad Delaguila, PA-C  levothyroxine (SYNTHROID, LEVOTHROID) 75 MCG tablet TAKE 1 TABLET(75 MCG) BY MOUTH DAILY 01/08/17  Yes Laaibah Wartman, PA-C  lisinopril-hydrochlorothiazide (PRINZIDE,ZESTORETIC) 10-12.5 MG tablet Take 1 tablet by mouth daily. 01/08/17  Yes Truett Mcfarlan, PA-C  Multiple Vitamin (MULTIVITAMIN) tablet Take 1 tablet by mouth daily.     Yes [provider]  Omega-3 Fatty Acids (FISH OIL) 1000 MG CAPS Take 1,200 mg by mouth daily.    Yes [provider]  sertraline (ZOLOFT) 50 MG tablet Take 1 tablet (50 mg total) by mouth daily. 01/08/17  Yes Lynnetta Tom, PA-C  vitamin C (ASCORBIC ACID)  500 MG tablet Take 1,000 mg by mouth daily.    Yes [provider]     No Known Allergies     Objective:  Physical Exam  Constitutional: She is oriented to person, place, and time.  She appears well-developed and well-nourished. She is active and cooperative. No distress.  BP 132/84 (BP Location: Left Arm, Patient Position: Sitting, Cuff Size: Large)   Pulse (!) 116   Temp 98.1 F (36.7 C) (Oral)   Resp 18   Ht 5' 6.5" (1.689 m)   Wt 253 lb 12.8 oz (115.1 kg)   SpO2 98%   BMI 40.35 kg/m   HENT:  Head: Normocephalic and atraumatic.  Right Ear: Hearing normal.  Left Ear: Hearing normal.  Eyes: Conjunctivae are normal. No scleral icterus.  Neck: Normal range of motion. Neck supple. No thyromegaly present.  Cardiovascular: Normal rate, regular rhythm and normal heart sounds.  Pulses:      Radial pulses are 2+ on the right side, and 2+ on the left side.  Pulmonary/Chest: Effort normal and breath sounds normal.  Lymphadenopathy:       Head (right side): No tonsillar, no preauricular, no posterior auricular and no occipital adenopathy present.       Head (left side): No tonsillar, no preauricular, no posterior auricular and no occipital adenopathy present.    She has no cervical adenopathy.       Right: No supraclavicular adenopathy present.       Left: No supraclavicular adenopathy present.  Neurological: She is alert and oriented to person, place, and time. No sensory deficit.  Skin: Skin is warm, dry and intact. No rash noted. No cyanosis or erythema. Nails show no clubbing.  Psychiatric: She has a normal mood and affect. Her speech is normal and behavior is normal.       Diabetic Foot Exam - Simple   Simple Foot Form Diabetic Foot exam was performed with the following findings:  Yes 07/30/2017 11:18 AM  Visual Inspection No deformities, no ulcerations, no other skin breakdown bilaterally:  Yes Sensation Testing Intact to touch and monofilament testing bilaterally:  Yes Pulse Check Posterior Tibialis and Dorsalis pulse intact bilaterally:  Yes Comments        Assessment & Plan:   Problem List Items Addressed This Visit    HTN (hypertension) -  Primary    COntrolled. Continue current treatment.      Relevant Orders   Comprehensive metabolic panel (Completed)   Hypothyroidism    Has been stable. Await lab results and adjust treatment if indicated.      Relevant Orders   TSH (Completed)   Depression    Stable. Controlled. Continue sertraline 50 mg daily.      Abnormally low high density lipoprotein (HDL) cholesterol with hypertriglyceridemia    Await labs. Adjust regimen as indicated by results. LDL goal <70. On low dose fenofibrate. Add statin if needed to achieve LDL goal.      Relevant Orders   Comprehensive metabolic panel (Completed)   Lipid panel (Completed)   Diabetes mellitus type 2, uncomplicated ([place])    She really doesn't want to take additional medications. Continue efforts for healthy lifestyle changes. Plan addition of metformin if A1C is >7%.      Relevant Orders   Comprehensive metabolic panel (Completed)   Hemoglobin A1c (Completed)       Return in about 3 months (around 10/28/2017) for re-evaluation of diabetes, cholesterol, blood pressure.   Fara Chute, PA-C [facility]

## 2017-07-31 LAB — LIPID PANEL
Chol/HDL Ratio: 4.5 ratio — ABNORMAL HIGH (ref 0.0–4.4)
Cholesterol, Total: 153 mg/dL (ref 100–199)
HDL: 34 mg/dL — AB (ref >39)
LDL Calculated: 86 mg/dL (ref 0–99)
TRIGLYCERIDES: 164 mg/dL — AB (ref 0–149)
VLDL CHOLESTEROL CAL: 33 mg/dL (ref 5–40)

## 2017-07-31 LAB — COMPREHENSIVE METABOLIC PANEL
ALBUMIN: 4.9 g/dL (ref 3.5–5.5)
ALK PHOS: 69 IU/L (ref 39–117)
ALT: 36 IU/L — ABNORMAL HIGH (ref 0–32)
AST: 27 IU/L (ref 0–40)
Albumin/Globulin Ratio: 2.1 (ref 1.2–2.2)
BUN/Creatinine Ratio: 19 (ref 9–23)
BUN: 14 mg/dL (ref 6–24)
Bilirubin Total: 0.3 mg/dL (ref 0.0–1.2)
CALCIUM: 9.5 mg/dL (ref 8.7–10.2)
CO2: 23 mmol/L (ref 20–29)
CREATININE: 0.73 mg/dL (ref 0.57–1.00)
Chloride: 102 mmol/L (ref 96–106)
GFR calc Af Amer: 110 mL/min/{1.73_m2} (ref >59)
GFR, EST NON AFRICAN AMERICAN: 95 mL/min/{1.73_m2} (ref >59)
GLOBULIN, TOTAL: 2.3 g/dL (ref 1.5–4.5)
GLUCOSE: 143 mg/dL — AB (ref 65–99)
Potassium: 4.1 mmol/L (ref 3.5–5.2)
Sodium: 138 mmol/L (ref 134–144)
Total Protein: 7.2 g/dL (ref 6.0–8.5)

## 2017-07-31 LAB — TSH: TSH: 3.22 u[IU]/mL (ref 0.450–4.500)

## 2017-07-31 LAB — HEMOGLOBIN A1C
ESTIMATED AVERAGE GLUCOSE: 163 mg/dL
HEMOGLOBIN A1C: 7.3 % — AB (ref 4.8–5.6)

## 2017-08-13 NOTE — Assessment & Plan Note (Signed)
COntrolled. Continue current treatment.

## 2017-08-13 NOTE — Assessment & Plan Note (Signed)
Has been stable. Await lab results and adjust treatment if indicated.

## 2017-08-13 NOTE — Assessment & Plan Note (Signed)
Stable. Controlled. Continue sertraline 50 mg daily.

## 2017-08-13 NOTE — Assessment & Plan Note (Signed)
She really doesn't want to take additional medications. Continue efforts for healthy lifestyle changes. Plan addition of metformin if A1C is >7%.

## 2017-08-13 NOTE — Assessment & Plan Note (Signed)
Await labs. Adjust regimen as indicated by results. LDL goal <70. On low dose fenofibrate. Add statin if needed to achieve LDL goal.

## 2017-08-14 ENCOUNTER — Other Ambulatory Visit: Payer: Self-pay | Admitting: Physician Assistant

## 2017-08-14 DIAGNOSIS — E034 Atrophy of thyroid (acquired): Secondary | ICD-10-CM

## 2017-09-13 ENCOUNTER — Other Ambulatory Visit: Payer: Self-pay | Admitting: Physician Assistant

## 2017-09-13 DIAGNOSIS — I10 Essential (primary) hypertension: Secondary | ICD-10-CM

## 2017-10-29 ENCOUNTER — Other Ambulatory Visit: Payer: Self-pay

## 2017-10-29 ENCOUNTER — Encounter: Payer: Self-pay | Admitting: Physician Assistant

## 2017-10-29 ENCOUNTER — Ambulatory Visit: Payer: BLUE CROSS/BLUE SHIELD | Admitting: Physician Assistant

## 2017-10-29 VITALS — BP 140/92 | HR 97 | Temp 98.2°F | Resp 16 | Ht 66.5 in | Wt 254.8 lb

## 2017-10-29 DIAGNOSIS — I10 Essential (primary) hypertension: Secondary | ICD-10-CM

## 2017-10-29 DIAGNOSIS — E781 Pure hyperglyceridemia: Secondary | ICD-10-CM | POA: Diagnosis not present

## 2017-10-29 DIAGNOSIS — E786 Lipoprotein deficiency: Secondary | ICD-10-CM

## 2017-10-29 DIAGNOSIS — E119 Type 2 diabetes mellitus without complications: Secondary | ICD-10-CM

## 2017-10-29 LAB — COMPREHENSIVE METABOLIC PANEL
A/G RATIO: 2.3 — AB (ref 1.2–2.2)
ALK PHOS: 68 IU/L (ref 39–117)
ALT: 33 IU/L — AB (ref 0–32)
AST: 24 IU/L (ref 0–40)
Albumin: 4.8 g/dL (ref 3.5–5.5)
BILIRUBIN TOTAL: 0.3 mg/dL (ref 0.0–1.2)
BUN/Creatinine Ratio: 23 (ref 9–23)
BUN: 16 mg/dL (ref 6–24)
CALCIUM: 9.9 mg/dL (ref 8.7–10.2)
CHLORIDE: 102 mmol/L (ref 96–106)
CO2: 22 mmol/L (ref 20–29)
Creatinine, Ser: 0.7 mg/dL (ref 0.57–1.00)
GFR calc Af Amer: 115 mL/min/{1.73_m2} (ref >59)
GFR, EST NON AFRICAN AMERICAN: 100 mL/min/{1.73_m2} (ref >59)
Globulin, Total: 2.1 g/dL (ref 1.5–4.5)
Glucose: 160 mg/dL — ABNORMAL HIGH (ref 65–99)
POTASSIUM: 4.3 mmol/L (ref 3.5–5.2)
SODIUM: 141 mmol/L (ref 134–144)
Total Protein: 6.9 g/dL (ref 6.0–8.5)

## 2017-10-29 LAB — HEMOGLOBIN A1C
ESTIMATED AVERAGE GLUCOSE: 171 mg/dL
HEMOGLOBIN A1C: 7.6 % — AB (ref 4.8–5.6)

## 2017-10-29 LAB — LIPID PANEL
CHOLESTEROL TOTAL: 142 mg/dL (ref 100–199)
Chol/HDL Ratio: 4.9 ratio — ABNORMAL HIGH (ref 0.0–4.4)
HDL: 29 mg/dL — ABNORMAL LOW (ref >39)
LDL Calculated: 79 mg/dL (ref 0–99)
Triglycerides: 172 mg/dL — ABNORMAL HIGH (ref 0–149)
VLDL Cholesterol Cal: 34 mg/dL (ref 5–40)

## 2017-10-29 NOTE — Progress Notes (Signed)
Subjective:    Patient ID: [name], female    DOB: [birth year], 52 y.o.   MRN: [id number]   Chief Complaint  Patient presents with  . Diabetes    follow up   . Hyperlipidemia    follow up   . Hypertension    follow up     HPI Patient presents for re-evaluation of diabetes, hyperlipidemia, and hypertension. Patient has had a cough and URI for a week that is drastically improving. She does not seek any medical intervention for her cough and congestion as it is "on the up and up." Her last lasts labs were on 07/30/2017. At this time, her Hemoglobin A1c 7.3 % and her glucose on CMP was 143 mg/dL. Patient is fasting today.On 07/30/17, patient's LDL and total cholesterol were within normal limits, but TG were 164 mg/dL.  Patient takes blood pressure at home. She states that it runs on average 118/85 mm Hg. In office today her blood pressure 140/92 mm Hg. Patient states that her mood is good.  Patient is scheduled for her 6 month dental follow up on 10/31/2017. Patient is scheduled for a follow up with her eye doctor July.  Patient Active Problem List   Diagnosis Date Noted  . Hx of adenomatous polyp of colon 03/08/2016  . Diabetes mellitus type 2, uncomplicated ([place]) 76/73/4193  . Abnormally low high density lipoprotein (HDL) cholesterol with hypertriglyceridemia 12/16/2014  . BMI 40.0-44.9, adult ([place]) 12/03/2012  . HTN (hypertension) 07/30/2012  . Hypothyroidism 07/30/2012  . Depression 07/30/2012  . Pernicious anemia 07/02/2011  . mucinous LMP tumor 05/03/2011   No Known Allergies   Prior to Admission medications   Medication Sig Start Date End Date Taking? Authorizing Provider  cholecalciferol (VITAMIN D) 1000 UNITS tablet Take 2,000 Units by mouth daily. 2000   Yes [provider]  fenofibrate 54 MG tablet Take 2 tablets (108 mg total) by mouth daily. 07/08/17  Yes Jeffery, Chelle, PA-C  levothyroxine (SYNTHROID, LEVOTHROID) 75 MCG tablet TAKE 1 TABLET(75  MCG) BY MOUTH DAILY 08/14/17  Yes Jeffery, Chelle, PA-C  lisinopril-hydrochlorothiazide (PRINZIDE,ZESTORETIC) 10-12.5 MG tablet TAKE 1 TABLET BY MOUTH DAILY 09/13/17  Yes Jeffery, Chelle, PA-C  Multiple Vitamin (MULTIVITAMIN) tablet Take 1 tablet by mouth daily.     Yes [provider]  Omega-3 Fatty Acids (FISH OIL) 1000 MG CAPS Take 1,200 mg by mouth daily.    Yes [provider]  sertraline (ZOLOFT) 50 MG tablet Take 1 tablet (50 mg total) by mouth daily. 01/08/17  Yes Jeffery, Chelle, PA-C  vitamin C (ASCORBIC ACID) 500 MG tablet Take 1,000 mg by mouth daily.    Yes [provider]    Past Medical History:  Diagnosis Date  . Anemia   . Anxiety   . History of blood transfusion   . Hx of adenomatous polyp of colon 03/08/2016  . Hypertension   . mucinous LMP tumor 06/18/2011  . Pernicious anemia 07/02/2011  . Thyroid activity decreased   . Vitamin B12 deficiency    Social History   Socioeconomic History  . Marital status: Single    Spouse name: n/a  . Number of children: 0  . Years of education: 12th grade  . Highest education level: Not on file  Social Needs  . Financial resource strain: Not on file  . Food insecurity - worry: Not on file  . Food insecurity - inability: Not on file  . Transportation needs - medical: Not on file  . Transportation  needs - non-medical: Not on file  Occupational History  . Occupation: Unemployed.    Employer: [employer]    Comment: cares for her elderly parents  Tobacco Use  . Smoking status: Never Smoker  . Smokeless tobacco: Never Used  Substance and Sexual Activity  . Alcohol use: No    Alcohol/week: 0.0 oz  . Drug use: No  . Sexual activity: No  Other Topics Concern  . Not on file  Social History Narrative   Lives with and cares for her parents.   Exercise: Walks 1-3 times a week for 30 minutes.   Family History  Adopted: Yes  Problem Relation Age of Onset  . Colon cancer Neg Hx    Past Surgical History:    Procedure Laterality Date  . ABDOMINAL HYSTERECTOMY  2009   TAHBSO  . LAPAROSCOPIC TOTAL HYSTERECTOMY Bilateral 2009   cysts   Review of Systems  Constitutional: Negative.  Negative for activity change, appetite change, fatigue and unexpected weight change.  HENT: Positive for congestion. Negative for ear pain, rhinorrhea, sinus pressure, sinus pain, sore throat and tinnitus.   Eyes: Negative.  Negative for photophobia and visual disturbance.  Respiratory: Positive for cough. Negative for chest tightness and shortness of breath.   Cardiovascular: Negative.  Negative for chest pain and palpitations.  Gastrointestinal: Negative.  Negative for abdominal distention, abdominal pain, constipation, diarrhea, nausea and vomiting.  Endocrine: Negative.  Negative for polydipsia and polyuria.  Genitourinary: Negative.  Negative for difficulty urinating, dysuria, frequency and urgency.  Musculoskeletal: Negative.  Negative for arthralgias, joint swelling and myalgias.  Skin: Negative.  Negative for color change and rash.  Neurological: Negative.  Negative for dizziness, light-headedness, numbness and headaches.  Psychiatric/Behavioral: Negative.  Negative for dysphoric mood and sleep disturbance. The patient is not nervous/anxious.        Objective:   Physical Exam  Constitutional: She is oriented to person, place, and time. She appears well-developed and well-nourished.  BP (!) 140/92   Pulse 97   Temp 98.2 F (36.8 C)   Resp 16   Ht 5' 6.5" (1.689 m)   Wt 254 lb 12.8 oz (115.6 kg)   SpO2 97%   BMI 40.51 kg/m   HENT:  Head: Normocephalic and atraumatic.  Right Ear: External ear normal.  Left Ear: External ear normal.  Nose: Nose normal.  Mouth/Throat: Oropharynx is clear and moist.  Eyes: Conjunctivae and EOM are normal. Pupils are equal, round, and reactive to light.  Neck: Normal range of motion. Neck supple.  Cardiovascular: Normal rate, regular rhythm, normal heart sounds and  intact distal pulses. Exam reveals no gallop and no friction rub.  No murmur heard. Pulmonary/Chest: Effort normal and breath sounds normal.  Abdominal: Soft. Bowel sounds are normal.  Musculoskeletal: Normal range of motion.  Neurological: She is alert and oriented to person, place, and time. She has normal reflexes.  Skin: Skin is warm and dry. No rash noted. No erythema. No pallor.  Psychiatric: She has a normal mood and affect. Her behavior is normal. Judgment and thought content normal.   Wt Readings from Last 3 Encounters:  10/29/17 254 lb 12.8 oz (115.6 kg)  07/30/17 253 lb 12.8 oz (115.1 kg)  01/18/17 257 lb (116.6 kg)      Assessment & Plan:   1. Type 2 diabetes mellitus without complication, without long-term current use of insulin ([address]) Patient's last labs were on 07/30/2017 at which time her Hemoglobin A1c 7.3 % and her glucose on CMP  was 143 mg/dL. A CMP and hemoglobin A1c will be updated today. Medication regimen changes pending lab results.   - Comprehensive metabolic panel - Hemoglobin A1c  2. Essential hypertension Patient's blood pressure today was 140/92 mm Hg. Patient states that it averages around 118/85 mm Hg at home. - Comprehensive metabolic panel  3. Hypothyroidism due to acquired atrophy of thyroid Patient is currently asymptomatic (no weight changes, mood changes, body temperature changes of note,) that require an updated TSH at this juncture. TSH will be updated on annual physical on 01/29/2018.  4. Abnormally low high density lipoprotein (HDL) cholesterol with hypertriglyceridemia A lipid panel was repeated at this time. Changes in medication regime pending lab results.   - Lipid panel  5. BMI 40.0-44.9, adult [facility]) Medical weight management was discussed with patient, but patient would rather attempt to lose weight by herself via diet and exercise modification.   6. Reactive depression Patient reports to doing well on Sertraline. She does not have  dysmorphic mood, anxiety, or any changes in her mood prompting changes in management.  7. Pernicious anemia This was not evaluated at this visit, it will be assessed at annual physical on 01/29/2018.  Return in about 3 months (around 01/29/2018) for Annual Physical and fasting labs.

## 2017-10-29 NOTE — Patient Instructions (Addendum)
Re-commit to your health. Make the healthy eating and exercise choices that you know. Taking care of yourself allows you to take care of your parents.    IF you received an x-ray today, you will receive an invoice from [facility]. Please contact [facility] at [phone] with questions or concerns regarding your invoice.   IF you received labwork today, you will receive an invoice from [facility]. Please contact [facility] at [phone] with questions or concerns regarding your invoice.   Our billing staff will not be able to assist you with questions regarding bills from these companies.  You will be contacted with the lab results as soon as they are available. The fastest way to get your results is to activate your My Chart account. Instructions are located on the last page of this paperwork. If you have not heard from [place] regarding the results in 2 weeks, please contact this office.

## 2017-10-29 NOTE — Progress Notes (Signed)
Patient ID: [name], female    DOB: [birth year], 52 y.o.   MRN: [id number]  PCP: Harrison Mons, PA-C  Chief Complaint  Patient presents with  . Diabetes    follow up   . Hyperlipidemia    follow up   . Hypertension    follow up     Subjective:   Presents for evaluation of diabetes, hyperlipidemia and hypertension.  In addition, she reports cough and upper respiratory symptoms for the past week though these are improving considerably.  Hemoglobin A1c in December was 7.3% Watching sugar intake.  Home blood pressures are less than 140/90. Tolerates medications without any difficulty.  No adverse effects.  Is reportedly good.  Review of Systems  Constitutional: Negative for activity change, appetite change, fatigue and unexpected weight change.  HENT: Negative for congestion, dental problem, ear pain, hearing loss, mouth sores, postnasal drip, rhinorrhea, sneezing, sore throat, tinnitus and trouble swallowing.   Eyes: Negative for photophobia, pain, redness and visual disturbance.  Respiratory: Negative for cough, chest tightness and shortness of breath.   Cardiovascular: Negative for chest pain, palpitations and leg swelling.  Gastrointestinal: Negative for abdominal pain, blood in stool, constipation, diarrhea, nausea and vomiting.  Endocrine: Negative for cold intolerance, heat intolerance, polydipsia, polyphagia and polyuria.  Genitourinary: Negative for dysuria, frequency, hematuria and urgency.  Musculoskeletal: Negative for arthralgias, gait problem, myalgias and neck stiffness.  Skin: Negative for rash.  Neurological: Negative for dizziness, speech difficulty, weakness, light-headedness, numbness and headaches.  Hematological: Negative for adenopathy.  Psychiatric/Behavioral: Negative for confusion and sleep disturbance. The patient is not nervous/anxious.    Depression screen [facility] 2/9 10/29/2017 07/30/2017 01/08/2017 07/10/2016 01/03/2016  Decreased  Interest 0 0 0 0 0  Down, Depressed, Hopeless 0 0 0 0 0  PHQ - 2 Score 0 0 0 0 0       Patient Active Problem List   Diagnosis Date Noted  . Hx of adenomatous polyp of colon 03/08/2016  . Diabetes mellitus type 2, uncomplicated ([place]) 25/85/2778  . Abnormally low high density lipoprotein (HDL) cholesterol with hypertriglyceridemia 12/16/2014  . BMI 40.0-44.9, adult ([place]) 12/03/2012  . HTN (hypertension) 07/30/2012  . Hypothyroidism 07/30/2012  . Depression 07/30/2012  . Pernicious anemia 07/02/2011  . 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 Authorizing Provider  cholecalciferol (VITAMIN D) 1000 UNITS tablet Take 2,000 Units by mouth daily. 2000   Yes [provider]  fenofibrate 54 MG tablet Take 2 tablets (108 mg total) by mouth daily. 07/08/17  Yes Sherod Cisse, PA-C  levothyroxine (SYNTHROID, LEVOTHROID) 75 MCG tablet TAKE 1 TABLET(75 MCG) BY MOUTH DAILY 08/14/17  Yes Irby Fails, PA-C  lisinopril-hydrochlorothiazide (PRINZIDE,ZESTORETIC) 10-12.5 MG tablet TAKE 1 TABLET BY MOUTH DAILY 09/13/17  Yes Adrine Hayworth, PA-C  Multiple Vitamin (MULTIVITAMIN) tablet Take 1 tablet by mouth daily.     Yes [provider]  Omega-3 Fatty Acids (FISH OIL) 1000 MG CAPS Take 1,200 mg by mouth daily.    Yes [provider]  sertraline (ZOLOFT) 50 MG tablet Take 1 tablet (50 mg total) by mouth daily. 01/08/17  Yes Jabir Dahlem, PA-C  vitamin C (ASCORBIC ACID) 500 MG tablet Take 1,000 mg by mouth daily.    Yes [provider]     No Known Allergies     Objective:  Physical Exam  Constitutional: She is oriented to person, place, and time. She appears  well-developed and well-nourished. No distress.  BP (!) 140/92   Pulse 97   Temp 98.2 F (36.8 C)   Resp 16   Ht 5' 6.5" (1.689 m)   Wt 254 lb 12.8 oz (115.6 kg)   SpO2 97%   BMI 40.51 kg/m    Eyes: Conjunctivae are normal.  No scleral icterus.  Neck: No thyromegaly present.  Cardiovascular: Normal rate, regular rhythm, normal heart sounds and intact distal pulses.  Pulmonary/Chest: Effort normal and breath sounds normal.  Lymphadenopathy:    She has no cervical adenopathy.  Neurological: She is alert and oriented to person, place, and time.  Skin: Skin is warm and dry.  Psychiatric: She has a normal mood and affect. Her speech is normal and behavior is normal.     Wt Readings from Last 3 Encounters:  10/29/17 254 lb 12.8 oz (115.6 kg)  07/30/17 253 lb 12.8 oz (115.1 kg)  01/18/17 257 lb (116.6 kg)       Assessment & Plan:   Problem List Items Addressed This Visit    HTN (hypertension)    Well-controlled at home though above goal today.  Continue current regimen of lisinopril HCTZ 10/12.5 daily.      Relevant Orders   Comprehensive metabolic panel (Completed)   Abnormally low high density lipoprotein (HDL) cholesterol with hypertriglyceridemia    Update lipid profile today.  Currently on fish oil and fenofibrate to address triglycerides.  Goal LDL is less than 70 due to diabetes.      Relevant Orders   Lipid panel (Completed)   Diabetes mellitus type 2, uncomplicated ([facility]) - Primary    Last hemoglobin A1c was above goal at 7.3%.  Continue healthy lifestyle modification.  Update A1c today.  If continues above 7% will recommend metformin.      Relevant Orders   Comprehensive metabolic panel (Completed)   Hemoglobin A1c (Completed)       Return in about 3 months (around 01/29/2018) for Annual Physical and fasting labs.   Fara Chute, PA-C [facility] at [place]

## 2017-11-17 ENCOUNTER — Other Ambulatory Visit: Payer: Self-pay | Admitting: Physician Assistant

## 2017-11-17 DIAGNOSIS — E034 Atrophy of thyroid (acquired): Secondary | ICD-10-CM

## 2017-11-18 ENCOUNTER — Encounter: Payer: Self-pay | Admitting: Physician Assistant

## 2017-11-21 ENCOUNTER — Telehealth: Payer: Self-pay | Admitting: Physician Assistant

## 2017-11-21 NOTE — Telephone Encounter (Signed)
Called and spoke with pt about Chelle leaving the practice. Pt chose to cancel appt and follow Chelle to [facility].

## 2017-12-07 ENCOUNTER — Other Ambulatory Visit: Payer: Self-pay | Admitting: Physician Assistant

## 2017-12-07 DIAGNOSIS — I10 Essential (primary) hypertension: Secondary | ICD-10-CM

## 2017-12-08 NOTE — Assessment & Plan Note (Signed)
Update lipid profile today.  Currently on fish oil and fenofibrate to address triglycerides.  Goal LDL is less than 70 due to diabetes.

## 2017-12-08 NOTE — Assessment & Plan Note (Signed)
Well-controlled at home though above goal today.  Continue current regimen of lisinopril HCTZ 10/12.5 daily.

## 2017-12-08 NOTE — Assessment & Plan Note (Signed)
Last hemoglobin A1c was above goal at 7.3%.  Continue healthy lifestyle modification.  Update A1c today.  If continues above 7% will recommend metformin.

## 2017-12-13 ENCOUNTER — Other Ambulatory Visit: Payer: Self-pay | Admitting: Physician Assistant

## 2017-12-13 DIAGNOSIS — F329 Major depressive disorder, single episode, unspecified: Secondary | ICD-10-CM

## 2017-12-24 ENCOUNTER — Other Ambulatory Visit: Payer: Self-pay | Admitting: Physician Assistant

## 2017-12-24 DIAGNOSIS — Z1231 Encounter for screening mammogram for malignant neoplasm of breast: Secondary | ICD-10-CM

## 2018-01-03 ENCOUNTER — Other Ambulatory Visit: Payer: Self-pay | Admitting: Physician Assistant

## 2018-01-03 DIAGNOSIS — E786 Lipoprotein deficiency: Principal | ICD-10-CM

## 2018-01-03 DIAGNOSIS — E781 Pure hyperglyceridemia: Secondary | ICD-10-CM

## 2018-01-10 ENCOUNTER — Encounter: Payer: Self-pay | Admitting: Physician Assistant

## 2018-01-14 ENCOUNTER — Ambulatory Visit
Admission: RE | Admit: 2018-01-14 | Discharge: 2018-01-14 | Disposition: A | Discharge status: Home / Self Care | Payer: BLUE CROSS/BLUE SHIELD | Source: Ambulatory Visit | Attending: Physician Assistant | Admitting: Physician Assistant

## 2018-01-14 DIAGNOSIS — Z1231 Encounter for screening mammogram for malignant neoplasm of breast: Secondary | ICD-10-CM | POA: Diagnosis not present

## 2018-01-17 ENCOUNTER — Inpatient Hospital Stay: Payer: BLUE CROSS/BLUE SHIELD

## 2018-01-17 ENCOUNTER — Inpatient Hospital Stay (HOSPITAL_BASED_OUTPATIENT_CLINIC_OR_DEPARTMENT_OTHER): Payer: BLUE CROSS/BLUE SHIELD | Admitting: Hematology & Oncology

## 2018-01-17 ENCOUNTER — Other Ambulatory Visit: Payer: Self-pay

## 2018-01-17 ENCOUNTER — Encounter: Payer: Self-pay | Admitting: Hematology & Oncology

## 2018-01-17 ENCOUNTER — Inpatient Hospital Stay: Payer: BLUE CROSS/BLUE SHIELD | Attending: Hematology & Oncology

## 2018-01-17 VITALS — BP 142/90 | HR 87 | Temp 98.1°F | Resp 18 | Wt 255.0 lb

## 2018-01-17 DIAGNOSIS — D510 Vitamin B12 deficiency anemia due to intrinsic factor deficiency: Secondary | ICD-10-CM | POA: Diagnosis not present

## 2018-01-17 DIAGNOSIS — E039 Hypothyroidism, unspecified: Secondary | ICD-10-CM

## 2018-01-17 LAB — CBC WITH DIFFERENTIAL (CANCER CENTER ONLY)
Basophils Absolute: 0.1 10*3/uL (ref 0.0–0.1)
Basophils Relative: 1 %
EOS PCT: 2 %
Eosinophils Absolute: 0.1 10*3/uL (ref 0.0–0.5)
HCT: 42.7 % (ref 34.8–46.6)
Hemoglobin: 14.6 g/dL (ref 11.6–15.9)
LYMPHS ABS: 1.8 10*3/uL (ref 0.9–3.3)
Lymphocytes Relative: 27 %
MCH: 27.9 pg (ref 26.0–34.0)
MCHC: 34.2 g/dL (ref 32.0–36.0)
MCV: 81.5 fL (ref 81.0–101.0)
MONO ABS: 0.6 10*3/uL (ref 0.1–0.9)
MONOS PCT: 9 %
Neutro Abs: 4.1 10*3/uL (ref 1.5–6.5)
Neutrophils Relative %: 61 %
PLATELETS: 200 10*3/uL (ref 145–400)
RBC: 5.24 MIL/uL (ref 3.70–5.32)
RDW: 14.1 % (ref 11.1–15.7)
WBC: 6.7 10*3/uL (ref 3.9–10.0)

## 2018-01-17 LAB — CMP (CANCER CENTER ONLY)
ALT: 40 U/L (ref 10–47)
ANION GAP: 10 (ref 5–15)
AST: 32 U/L (ref 11–38)
Albumin: 4.2 g/dL (ref 3.5–5.0)
Alkaline Phosphatase: 66 U/L (ref 26–84)
BUN: 14 mg/dL (ref 7–22)
CO2: 27 mmol/L (ref 18–33)
Calcium: 9.7 mg/dL (ref 8.0–10.3)
Chloride: 104 mmol/L (ref 98–108)
Creatinine: 0.9 mg/dL (ref 0.60–1.20)
GLUCOSE: 182 mg/dL — AB (ref 73–118)
POTASSIUM: 3.9 mmol/L (ref 3.3–4.7)
Sodium: 141 mmol/L (ref 128–145)
TOTAL PROTEIN: 7.6 g/dL (ref 6.4–8.1)
Total Bilirubin: 0.6 mg/dL (ref 0.2–1.6)

## 2018-01-17 LAB — VITAMIN B12: VITAMIN B 12: 184 pg/mL (ref 180–914)

## 2018-01-17 MED ORDER — CYANOCOBALAMIN 1000 MCG/ML IJ SOLN
INTRAMUSCULAR | Status: AC
  Filled 2018-01-17: qty 1

## 2018-01-17 MED ORDER — CYANOCOBALAMIN 1000 MCG/ML IJ SOLN
1000.0000 ug | Freq: Once | INTRAMUSCULAR | Status: AC
  Administered 2018-01-17: 1000 ug via INTRAMUSCULAR

## 2018-01-17 NOTE — Progress Notes (Signed)
Hematology and Oncology Follow Up Visit  KEEANA [name] [id number] [birth year] 52 y.o. 01/17/2018   Principle Diagnosis:   Pernicious anemia-  anti-parietal cell antibody  Hypothyroidism  Current Therapy:    Vitamin B-12 1000 g IM every 6 months     Interim History:  [name] is back for follow-up. We see her once a year ourselves. She gets her B-12 shots every 6 months. She's doing quite well.   She looks great.  She is doing well.  She took her parents to the [employer] for their 85th birthday.  They had a wonderful time.  She is having no problems healthwise.  She has had no fever.  She is had no cough or shortness of breath.  She has had no rashes.  She is had no bleeding.  Her thyroid has been doing pretty well.  Perhaps she is watching her weight very closely.  Right now, her performance status is ECOG 0.  Medications:  Current Outpatient Medications:  .  cholecalciferol (VITAMIN D) 1000 UNITS tablet, Take 2,000 Units by mouth daily. 2000, Disp: , Rfl:  .  fenofibrate 54 MG tablet, TAKE 2 TABLETS(108 MG) BY MOUTH DAILY, Disp: 180 tablet, Rfl: 0 .  levothyroxine (SYNTHROID, LEVOTHROID) 75 MCG tablet, TAKE 1 TABLET(75 MCG) BY MOUTH DAILY, Disp: 90 tablet, Rfl: 0 .  lisinopril-hydrochlorothiazide (PRINZIDE,ZESTORETIC) 10-12.5 MG tablet, TAKE 1 TABLET BY MOUTH DAILY, Disp: 90 tablet, Rfl: 0 .  Multiple Vitamin (MULTIVITAMIN) tablet, Take 1 tablet by mouth daily.  , Disp: , Rfl:  .  Omega-3 Fatty Acids (FISH OIL) 1000 MG CAPS, Take 1,200 mg by mouth daily. , Disp: , Rfl:  .  sertraline (ZOLOFT) 50 MG tablet, TAKE 1 TABLET(50 MG) BY MOUTH DAILY, Disp: 90 tablet, Rfl: 0 .  vitamin C (ASCORBIC ACID) 500 MG tablet, Take 1,000 mg by mouth daily. , Disp: , Rfl:   Allergies: No Known Allergies  Past Medical History, Surgical history, Social history, and Family History were reviewed and updated.  Review of Systems: Review of Systems  Constitutional: Negative.   HENT:  Negative.   Eyes: Negative.   Respiratory: Negative.   Cardiovascular: Negative.   Gastrointestinal: Negative.   Genitourinary: Negative.   Musculoskeletal: Negative.   Skin: Negative.   Neurological: Negative.   Endo/Heme/Allergies: Negative.   Psychiatric/Behavioral: Negative.      Physical Exam:  weight is 255 lb (115.7 kg). Her oral temperature is 98.1 F (36.7 C). Her blood pressure is 142/90 (abnormal) and her pulse is 87. Her respiration is 18 and oxygen saturation is 98%.   Wt Readings from Last 3 Encounters:  01/17/18 255 lb (115.7 kg)  10/29/17 254 lb 12.8 oz (115.6 kg)  07/30/17 253 lb 12.8 oz (115.1 kg)    Physical Exam  Constitutional: She is oriented to person, place, and time.  HENT:  Head: Normocephalic and atraumatic.  Mouth/Throat: Oropharynx is clear and moist.  Eyes: Pupils are equal, round, and reactive to light. EOM are normal.  Neck: Normal range of motion.  Cardiovascular: Normal rate, regular rhythm and normal heart sounds.  Pulmonary/Chest: Effort normal and breath sounds normal.  Abdominal: Soft. Bowel sounds are normal.  Musculoskeletal: Normal range of motion. She exhibits no edema, tenderness or deformity.  Lymphadenopathy:    She has no cervical adenopathy.  Neurological: She is alert and oriented to person, place, and time.  Skin: Skin is warm and dry. No rash noted. No erythema.  Psychiatric: She has a normal mood and  affect. Her behavior is normal. Judgment and thought content normal.  Vitals reviewed.    Lab Results  Component Value Date   WBC 6.7 01/17/2018   HGB 14.6 01/17/2018   HCT 42.7 01/17/2018   MCV 81.5 01/17/2018   PLT 200 01/17/2018     Chemistry      Component Value Date/Time   NA 141 01/17/2018 0836   NA 141 10/29/2017 1019   NA 139 01/18/2017 0832   NA 139 01/20/2016 0811   K 3.9 01/17/2018 0836   K 3.9 01/18/2017 0832   K 3.9 01/20/2016 0811   CL 104 01/17/2018 0836   CL 104 01/18/2017 0832   CO2 27  01/17/2018 0836   CO2 26 01/18/2017 0832   CO2 24 01/20/2016 0811   BUN 14 01/17/2018 0836   BUN 16 10/29/2017 1019   BUN 13 01/18/2017 0832   BUN 15.6 01/20/2016 0811   CREATININE 0.90 01/17/2018 0836   CREATININE 0.8 01/18/2017 0832   CREATININE 0.9 01/20/2016 0811      Component Value Date/Time   CALCIUM PENDING 01/17/2018 0836   CALCIUM 9.6 01/18/2017 0832   CALCIUM 9.9 01/20/2016 0811   ALKPHOS 66 01/17/2018 0836   ALKPHOS 55 01/18/2017 0832   ALKPHOS 57 01/20/2016 0811   AST 32 01/17/2018 0836   AST 24 01/20/2016 0811   ALT 40 01/17/2018 0836   ALT 47 01/18/2017 0832   ALT 35 01/20/2016 0811   BILITOT 0.6 01/17/2018 0836   BILITOT 0.39 01/20/2016 0811         Impression and Plan: [name] is a 52 year old white female. She has pernicious anemia. She has anti-parietal cell antibodies. She presented back in 2001.  From my point of view, everything looks great. I don't see any issues with respect to her blood.  We will plan for another 1 year follow-up.   In 6 months, she will get her vitamin B-12 injection at the main cancer [facility].   Volanda Napoleon, MD 6/7/20199:26 AM

## 2018-02-04 ENCOUNTER — Encounter: Payer: BLUE CROSS/BLUE SHIELD | Admitting: Physician Assistant

## 2018-02-11 DIAGNOSIS — H2513 Age-related nuclear cataract, bilateral: Secondary | ICD-10-CM | POA: Diagnosis not present

## 2018-02-11 DIAGNOSIS — H35413 Lattice degeneration of retina, bilateral: Secondary | ICD-10-CM | POA: Diagnosis not present

## 2018-02-11 DIAGNOSIS — H35033 Hypertensive retinopathy, bilateral: Secondary | ICD-10-CM | POA: Diagnosis not present

## 2018-02-11 DIAGNOSIS — H25013 Cortical age-related cataract, bilateral: Secondary | ICD-10-CM | POA: Diagnosis not present

## 2018-02-11 LAB — HM DIABETES EYE EXAM

## 2018-02-27 ENCOUNTER — Encounter: Payer: Self-pay | Admitting: *Deleted

## 2018-03-05 DIAGNOSIS — Z0000 Encounter for general adult medical examination without abnormal findings: Secondary | ICD-10-CM | POA: Diagnosis not present

## 2018-03-05 DIAGNOSIS — F329 Major depressive disorder, single episode, unspecified: Secondary | ICD-10-CM | POA: Diagnosis not present

## 2018-03-05 DIAGNOSIS — I10 Essential (primary) hypertension: Secondary | ICD-10-CM | POA: Diagnosis not present

## 2018-03-05 DIAGNOSIS — E781 Pure hyperglyceridemia: Secondary | ICD-10-CM | POA: Diagnosis not present

## 2018-03-05 DIAGNOSIS — E119 Type 2 diabetes mellitus without complications: Secondary | ICD-10-CM | POA: Diagnosis not present

## 2018-03-05 DIAGNOSIS — E039 Hypothyroidism, unspecified: Secondary | ICD-10-CM | POA: Diagnosis not present

## 2018-03-05 DIAGNOSIS — E786 Lipoprotein deficiency: Secondary | ICD-10-CM | POA: Diagnosis not present

## 2018-03-06 ENCOUNTER — Encounter: Payer: Self-pay | Admitting: *Deleted

## 2018-06-05 DIAGNOSIS — Z6838 Body mass index (BMI) 38.0-38.9, adult: Secondary | ICD-10-CM | POA: Diagnosis not present

## 2018-06-05 DIAGNOSIS — E119 Type 2 diabetes mellitus without complications: Secondary | ICD-10-CM | POA: Diagnosis not present

## 2018-06-05 DIAGNOSIS — I10 Essential (primary) hypertension: Secondary | ICD-10-CM | POA: Diagnosis not present

## 2018-07-18 ENCOUNTER — Inpatient Hospital Stay: Payer: BLUE CROSS/BLUE SHIELD | Attending: Hematology & Oncology

## 2018-07-18 VITALS — BP 137/85 | HR 80 | Temp 97.8°F | Resp 16

## 2018-07-18 DIAGNOSIS — D510 Vitamin B12 deficiency anemia due to intrinsic factor deficiency: Secondary | ICD-10-CM | POA: Insufficient documentation

## 2018-07-18 MED ORDER — CYANOCOBALAMIN 1000 MCG/ML IJ SOLN
INTRAMUSCULAR | Status: AC
  Filled 2018-07-18: qty 1

## 2018-07-18 MED ORDER — CYANOCOBALAMIN 1000 MCG/ML IJ SOLN
1000.0000 ug | Freq: Once | INTRAMUSCULAR | Status: AC
  Administered 2018-07-18: 1000 ug via INTRAMUSCULAR

## 2018-07-18 NOTE — Patient Instructions (Signed)
Cyanocobalamin, Vitamin B12 injection What is this medicine? CYANOCOBALAMIN (sye an oh koe BAL a min) is a man made form of vitamin B12. Vitamin B12 is used in the growth of healthy blood cells, nerve cells, and proteins in the body. It also helps with the metabolism of fats and carbohydrates. This medicine is used to treat people who can not absorb vitamin B12. This medicine may be used for other purposes; ask your health care provider or pharmacist if you have questions. COMMON BRAND NAME(S): B-12 Compliance Kit, B-12 Injection Kit, Cyomin, LA-12, Nutri-Twelve, Physicians EZ Use B-12, Primabalt What should I tell my health care provider before I take this medicine? They need to know if you have any of these conditions: -kidney disease -Leber's disease -megaloblastic anemia -an unusual or allergic reaction to cyanocobalamin, cobalt, other medicines, foods, dyes, or preservatives -pregnant or trying to get pregnant -breast-feeding How should I use this medicine? This medicine is injected into a muscle or deeply under the skin. It is usually given by a health care professional in a clinic or doctor's office. However, your doctor may teach you how to inject yourself. Follow all instructions. Talk to your pediatrician regarding the use of this medicine in children. Special care may be needed. Overdosage: If you think you have taken too much of this medicine contact a poison control center or emergency room at once. NOTE: This medicine is only for you. Do not share this medicine with others. What if I miss a dose? If you are given your dose at a clinic or doctor's office, call to reschedule your appointment. If you give your own injections and you miss a dose, take it as soon as you can. If it is almost time for your next dose, take only that dose. Do not take double or extra doses. What may interact with this medicine? -colchicine -heavy alcohol intake This list may not describe all possible  interactions. Give your health care provider a list of all the medicines, herbs, non-prescription drugs, or dietary supplements you use. Also tell them if you smoke, drink alcohol, or use illegal drugs. Some items may interact with your medicine. What should I watch for while using this medicine? Visit your doctor or health care professional regularly. You may need blood work done while you are taking this medicine. You may need to follow a special diet. Talk to your doctor. Limit your alcohol intake and avoid smoking to get the best benefit. What side effects may I notice from receiving this medicine? Side effects that you should report to your doctor or health care professional as soon as possible: -allergic reactions like skin rash, itching or hives, swelling of the face, lips, or tongue -blue tint to skin -chest tightness, pain -difficulty breathing, wheezing -dizziness -red, swollen painful area on the leg Side effects that usually do not require medical attention (report to your doctor or health care professional if they continue or are bothersome): -diarrhea -headache This list may not describe all possible side effects. Call your doctor for medical advice about side effects. You may report side effects to FDA at 1-800-FDA-1088. Where should I keep my medicine? Keep out of the reach of children. Store at room temperature between 15 and 30 degrees C (59 and 85 degrees F). Protect from light. Throw away any unused medicine after the expiration date. NOTE: This sheet is a summary. It may not cover all possible information. If you have questions about this medicine, talk to your doctor, pharmacist, or   health care provider.  2018 Elsevier/Gold Standard (2007-11-10 22:10:20)  

## 2018-09-12 DIAGNOSIS — Z6841 Body Mass Index (BMI) 40.0 and over, adult: Secondary | ICD-10-CM | POA: Diagnosis not present

## 2018-09-12 DIAGNOSIS — E1169 Type 2 diabetes mellitus with other specified complication: Secondary | ICD-10-CM | POA: Diagnosis not present

## 2018-09-12 DIAGNOSIS — F325 Major depressive disorder, single episode, in full remission: Secondary | ICD-10-CM | POA: Diagnosis not present

## 2018-09-12 DIAGNOSIS — E1159 Type 2 diabetes mellitus with other circulatory complications: Secondary | ICD-10-CM | POA: Diagnosis not present

## 2018-09-12 DIAGNOSIS — I10 Essential (primary) hypertension: Secondary | ICD-10-CM | POA: Diagnosis not present

## 2018-09-12 DIAGNOSIS — E781 Pure hyperglyceridemia: Secondary | ICD-10-CM | POA: Diagnosis not present

## 2018-09-12 DIAGNOSIS — E039 Hypothyroidism, unspecified: Secondary | ICD-10-CM | POA: Diagnosis not present

## 2018-09-12 DIAGNOSIS — E786 Lipoprotein deficiency: Secondary | ICD-10-CM | POA: Diagnosis not present

## 2019-01-13 ENCOUNTER — Other Ambulatory Visit: Payer: Self-pay | Admitting: Physician Assistant

## 2019-01-13 DIAGNOSIS — Z1231 Encounter for screening mammogram for malignant neoplasm of breast: Secondary | ICD-10-CM

## 2019-01-16 ENCOUNTER — Other Ambulatory Visit: Payer: Self-pay

## 2019-01-16 ENCOUNTER — Encounter: Payer: Self-pay | Admitting: Hematology & Oncology

## 2019-01-16 ENCOUNTER — Inpatient Hospital Stay: Payer: BC Managed Care – PPO | Attending: Hematology & Oncology | Admitting: Hematology & Oncology

## 2019-01-16 ENCOUNTER — Inpatient Hospital Stay: Payer: BC Managed Care – PPO

## 2019-01-16 VITALS — BP 128/75 | HR 65 | Temp 97.6°F | Resp 18 | Wt 241.0 lb

## 2019-01-16 DIAGNOSIS — D510 Vitamin B12 deficiency anemia due to intrinsic factor deficiency: Secondary | ICD-10-CM | POA: Insufficient documentation

## 2019-01-16 DIAGNOSIS — E039 Hypothyroidism, unspecified: Secondary | ICD-10-CM | POA: Diagnosis not present

## 2019-01-16 LAB — CBC WITH DIFFERENTIAL (CANCER CENTER ONLY)
Abs Immature Granulocytes: 0.02 10*3/uL (ref 0.00–0.07)
Basophils Absolute: 0.1 10*3/uL (ref 0.0–0.1)
Basophils Relative: 1 %
Eosinophils Absolute: 0.1 10*3/uL (ref 0.0–0.5)
Eosinophils Relative: 1 %
HCT: 42.2 % (ref 36.0–46.0)
Hemoglobin: 13.7 g/dL (ref 12.0–15.0)
Immature Granulocytes: 0 %
Lymphocytes Relative: 25 %
Lymphs Abs: 1.7 10*3/uL (ref 0.7–4.0)
MCH: 27.1 pg (ref 26.0–34.0)
MCHC: 32.5 g/dL (ref 30.0–36.0)
MCV: 83.6 fL (ref 80.0–100.0)
Monocytes Absolute: 0.5 10*3/uL (ref 0.1–1.0)
Monocytes Relative: 7 %
Neutro Abs: 4.4 10*3/uL (ref 1.7–7.7)
Neutrophils Relative %: 66 %
Platelet Count: 230 10*3/uL (ref 150–400)
RBC: 5.05 MIL/uL (ref 3.87–5.11)
RDW: 14 % (ref 11.5–15.5)
WBC Count: 6.8 10*3/uL (ref 4.0–10.5)
nRBC: 0 % (ref 0.0–0.2)

## 2019-01-16 LAB — CMP (CANCER CENTER ONLY)
ALT: 30 U/L (ref 0–44)
AST: 20 U/L (ref 15–41)
Albumin: 4.8 g/dL (ref 3.5–5.0)
Alkaline Phosphatase: 65 U/L (ref 38–126)
Anion gap: 11 (ref 5–15)
BUN: 15 mg/dL (ref 6–20)
CO2: 23 mmol/L (ref 22–32)
Calcium: 9.4 mg/dL (ref 8.9–10.3)
Chloride: 104 mmol/L (ref 98–111)
Creatinine: 0.74 mg/dL (ref 0.44–1.00)
GFR, Est AFR Am: >60 mL/min (ref >60)
GFR, Estimated: >60 mL/min (ref >60)
Glucose, Bld: 157 mg/dL — ABNORMAL HIGH (ref 70–99)
Potassium: 4 mmol/L (ref 3.5–5.1)
Sodium: 138 mmol/L (ref 135–145)
Total Bilirubin: 0.4 mg/dL (ref 0.3–1.2)
Total Protein: 7.4 g/dL (ref 6.5–8.1)

## 2019-01-16 LAB — LACTATE DEHYDROGENASE: LDH: 160 U/L (ref 98–192)

## 2019-01-16 MED ORDER — CYANOCOBALAMIN 1000 MCG/ML IJ SOLN
1000.0000 ug | Freq: Once | INTRAMUSCULAR | Status: AC
  Administered 2019-01-16: 1000 ug via INTRAMUSCULAR

## 2019-01-16 MED ORDER — CYANOCOBALAMIN 1000 MCG/ML IJ SOLN
INTRAMUSCULAR | Status: AC
  Filled 2019-01-16: qty 1

## 2019-01-16 NOTE — Progress Notes (Signed)
Hematology and Oncology Follow Up Visit  [name] [id number] September 27, 1965 53 y.o. 01/16/2019   Principle Diagnosis:   Pernicious anemia-  anti-parietal cell antibody  Hypothyroidism  Current Therapy:    Vitamin B-12 1000 g IM every 6 months     Interim History:  [name] is back for follow-up. We see her once a year ourselves. She gets her B-12 shots every 6 months. She's doing quite well.   She looks great.,  She is not 53.  Whenever we see her, she usually has been to the beach.  Because of recall far she has not been to the beach yet.  Her mom is not doing too well.  She is having her own health issues.  She was a rehab center.  Now she is home.  [name] has had no problems with her bowels or bladder.    Overall, her performance status is ECOG 0.  Medications:  Current Outpatient Medications:  .  metFORMIN (GLUCOPHAGE) 500 MG tablet, Take 500 mg by mouth 2 (two) times a day. Take two tablets by mouth (1000 mg) two times a day with meals., Disp: , Rfl:  .  cholecalciferol (VITAMIN D) 1000 UNITS tablet, Take 2,000 Units by mouth daily. 2000, Disp: , Rfl:  .  fenofibrate 54 MG tablet, TAKE 2 TABLETS(108 MG) BY MOUTH DAILY, Disp: 180 tablet, Rfl: 0 .  levothyroxine (SYNTHROID, LEVOTHROID) 75 MCG tablet, TAKE 1 TABLET(75 MCG) BY MOUTH DAILY, Disp: 90 tablet, Rfl: 0 .  lisinopril-hydrochlorothiazide (PRINZIDE,ZESTORETIC) 10-12.5 MG tablet, TAKE 1 TABLET BY MOUTH DAILY, Disp: 90 tablet, Rfl: 0 .  Multiple Vitamin (MULTIVITAMIN) tablet, Take 1 tablet by mouth daily.  , Disp: , Rfl:  .  Omega-3 Fatty Acids (FISH OIL) 1000 MG CAPS, Take 1,200 mg by mouth daily. , Disp: , Rfl:  .  sertraline (ZOLOFT) 50 MG tablet, TAKE 1 TABLET(50 MG) BY MOUTH DAILY, Disp: 90 tablet, Rfl: 0 .  vitamin C (ASCORBIC ACID) 500 MG tablet, Take 1,000 mg by mouth daily. , Disp: , Rfl:   Allergies: No Known Allergies  Past Medical History, Surgical history, Social history, and Family  History were reviewed and updated.  Review of Systems: Review of Systems  Constitutional: Negative.   HENT: Negative.   Eyes: Negative.   Respiratory: Negative.   Cardiovascular: Negative.   Gastrointestinal: Negative.   Genitourinary: Negative.   Musculoskeletal: Negative.   Skin: Negative.   Neurological: Negative.   Endo/Heme/Allergies: Negative.   Psychiatric/Behavioral: Negative.      Physical Exam:  weight is 241 lb (109.3 kg). Her oral temperature is 97.6 F (36.4 C). Her blood pressure is 128/75 and her pulse is 65. Her respiration is 18 and oxygen saturation is 100%.   Wt Readings from Last 3 Encounters:  01/16/19 241 lb (109.3 kg)  01/17/18 255 lb (115.7 kg)  10/29/17 254 lb 12.8 oz (115.6 kg)    Physical Exam Vitals signs reviewed.  HENT:     Head: Normocephalic and atraumatic.  Eyes:     Pupils: Pupils are equal, round, and reactive to light.  Neck:     Musculoskeletal: Normal range of motion.  Cardiovascular:     Rate and Rhythm: Normal rate and regular rhythm.     Heart sounds: Normal heart sounds.  Pulmonary:     Effort: Pulmonary effort is normal.     Breath sounds: Normal breath sounds.  Abdominal:     General: Bowel sounds are normal.     Palpations:  Abdomen is soft.  Musculoskeletal: Normal range of motion.        General: No tenderness or deformity.  Lymphadenopathy:     Cervical: No cervical adenopathy.  Skin:    General: Skin is warm and dry.     Findings: No erythema or rash.  Neurological:     Mental Status: She is alert and oriented to person, place, and time.  Psychiatric:        Behavior: Behavior normal.        Thought Content: Thought content normal.        Judgment: Judgment normal.      Lab Results  Component Value Date   WBC 6.8 01/16/2019   HGB 13.7 01/16/2019   HCT 42.2 01/16/2019   MCV 83.6 01/16/2019   PLT 230 01/16/2019     Chemistry      Component Value Date/Time   NA 138 01/16/2019 0855   NA 141  10/29/2017 1019   NA 139 01/18/2017 0832   NA 139 01/20/2016 0811   K 4.0 01/16/2019 0855   K 3.9 01/18/2017 0832   K 3.9 01/20/2016 0811   CL 104 01/16/2019 0855   CL 104 01/18/2017 0832   CO2 23 01/16/2019 0855   CO2 26 01/18/2017 0832   CO2 24 01/20/2016 0811   BUN 15 01/16/2019 0855   BUN 16 10/29/2017 1019   BUN 13 01/18/2017 0832   BUN 15.6 01/20/2016 0811   CREATININE 0.74 01/16/2019 0855   CREATININE 0.8 01/18/2017 0832   CREATININE 0.9 01/20/2016 0811      Component Value Date/Time   CALCIUM 9.4 01/16/2019 0855   CALCIUM 9.6 01/18/2017 0832   CALCIUM 9.9 01/20/2016 0811   ALKPHOS 65 01/16/2019 0855   ALKPHOS 55 01/18/2017 0832   ALKPHOS 57 01/20/2016 0811   AST 20 01/16/2019 0855   AST 24 01/20/2016 0811   ALT 30 01/16/2019 0855   ALT 47 01/18/2017 0832   ALT 35 01/20/2016 0811   BILITOT 0.4 01/16/2019 0855   BILITOT 0.39 01/20/2016 0811         Impression and Plan: [name] is a 53 year old white female. She has pernicious anemia. She has anti-parietal cell antibodies. She presented back in 2001.  From my point of view, everything looks great. I don't see any issues with respect to her blood.  We will plan for another 1 year follow-up.   In 6 months, she will get her vitamin B-12 injection at the main cancer Center.   Volanda Napoleon, MD 6/5/20209:53 AM

## 2019-01-16 NOTE — Patient Instructions (Signed)

## 2019-01-19 LAB — VITAMIN B12: Vitamin B-12: 171 pg/mL — ABNORMAL LOW (ref 180–914)

## 2019-02-17 DIAGNOSIS — H35033 Hypertensive retinopathy, bilateral: Secondary | ICD-10-CM | POA: Diagnosis not present

## 2019-02-17 DIAGNOSIS — H25013 Cortical age-related cataract, bilateral: Secondary | ICD-10-CM | POA: Diagnosis not present

## 2019-02-17 DIAGNOSIS — H35413 Lattice degeneration of retina, bilateral: Secondary | ICD-10-CM | POA: Diagnosis not present

## 2019-02-17 DIAGNOSIS — H2513 Age-related nuclear cataract, bilateral: Secondary | ICD-10-CM | POA: Diagnosis not present

## 2019-03-02 DIAGNOSIS — E785 Hyperlipidemia, unspecified: Secondary | ICD-10-CM | POA: Diagnosis not present

## 2019-03-02 DIAGNOSIS — E039 Hypothyroidism, unspecified: Secondary | ICD-10-CM | POA: Diagnosis not present

## 2019-03-02 DIAGNOSIS — E1169 Type 2 diabetes mellitus with other specified complication: Secondary | ICD-10-CM | POA: Diagnosis not present

## 2019-03-02 DIAGNOSIS — F329 Major depressive disorder, single episode, unspecified: Secondary | ICD-10-CM | POA: Diagnosis not present

## 2019-03-03 ENCOUNTER — Ambulatory Visit
Admission: RE | Admit: 2019-03-03 | Discharge: 2019-03-03 | Disposition: A | Discharge status: Home / Self Care | Payer: BC Managed Care – PPO | Source: Ambulatory Visit | Attending: Physician Assistant | Admitting: Physician Assistant

## 2019-03-03 ENCOUNTER — Other Ambulatory Visit: Payer: Self-pay

## 2019-03-03 DIAGNOSIS — Z1231 Encounter for screening mammogram for malignant neoplasm of breast: Secondary | ICD-10-CM

## 2019-03-09 DIAGNOSIS — E1169 Type 2 diabetes mellitus with other specified complication: Secondary | ICD-10-CM | POA: Diagnosis not present

## 2019-03-09 DIAGNOSIS — I10 Essential (primary) hypertension: Secondary | ICD-10-CM | POA: Diagnosis not present

## 2019-03-09 DIAGNOSIS — E1159 Type 2 diabetes mellitus with other circulatory complications: Secondary | ICD-10-CM | POA: Diagnosis not present

## 2019-03-09 DIAGNOSIS — Z0000 Encounter for general adult medical examination without abnormal findings: Secondary | ICD-10-CM | POA: Diagnosis not present

## 2019-03-09 DIAGNOSIS — E1165 Type 2 diabetes mellitus with hyperglycemia: Secondary | ICD-10-CM | POA: Diagnosis not present

## 2019-03-09 DIAGNOSIS — E785 Hyperlipidemia, unspecified: Secondary | ICD-10-CM | POA: Diagnosis not present

## 2019-06-05 DIAGNOSIS — E785 Hyperlipidemia, unspecified: Secondary | ICD-10-CM | POA: Diagnosis not present

## 2019-06-05 DIAGNOSIS — E1165 Type 2 diabetes mellitus with hyperglycemia: Secondary | ICD-10-CM | POA: Diagnosis not present

## 2019-06-05 DIAGNOSIS — E1169 Type 2 diabetes mellitus with other specified complication: Secondary | ICD-10-CM | POA: Diagnosis not present

## 2019-06-05 DIAGNOSIS — E1159 Type 2 diabetes mellitus with other circulatory complications: Secondary | ICD-10-CM | POA: Diagnosis not present

## 2019-06-05 DIAGNOSIS — I10 Essential (primary) hypertension: Secondary | ICD-10-CM | POA: Diagnosis not present

## 2019-06-09 DIAGNOSIS — E1169 Type 2 diabetes mellitus with other specified complication: Secondary | ICD-10-CM | POA: Diagnosis not present

## 2019-06-09 DIAGNOSIS — I10 Essential (primary) hypertension: Secondary | ICD-10-CM | POA: Diagnosis not present

## 2019-06-09 DIAGNOSIS — F325 Major depressive disorder, single episode, in full remission: Secondary | ICD-10-CM | POA: Diagnosis not present

## 2019-06-09 DIAGNOSIS — E1159 Type 2 diabetes mellitus with other circulatory complications: Secondary | ICD-10-CM | POA: Diagnosis not present

## 2019-06-09 DIAGNOSIS — E1165 Type 2 diabetes mellitus with hyperglycemia: Secondary | ICD-10-CM | POA: Diagnosis not present

## 2019-06-19 ENCOUNTER — Other Ambulatory Visit: Payer: Self-pay

## 2019-06-19 ENCOUNTER — Inpatient Hospital Stay: Payer: BC Managed Care – PPO | Attending: Hematology & Oncology

## 2019-06-19 VITALS — BP 126/72 | HR 83 | Temp 98.2°F | Resp 16

## 2019-06-19 DIAGNOSIS — D510 Vitamin B12 deficiency anemia due to intrinsic factor deficiency: Secondary | ICD-10-CM

## 2019-06-19 MED ORDER — CYANOCOBALAMIN 1000 MCG/ML IJ SOLN
1000.0000 ug | Freq: Once | INTRAMUSCULAR | Status: AC
  Administered 2019-06-19: 1000 ug via INTRAMUSCULAR

## 2019-06-19 NOTE — Patient Instructions (Signed)
Cyanocobalamin, Pyridoxine, and Folate What is this medicine? A multivitamin containing folic acid, vitamin B6, and vitamin B12. This medicine may be used for other purposes; ask your health care provider or pharmacist if you have questions. COMMON BRAND NAME(S): AllanFol RX, AllanTex, Av-Vite FB, B Complex with Folic Acid, ComBgen, FaBB, Folamin, Folastin, Folbalin, Folbee, Folbic, Folcaps, Folgard, Folgard RX, Folgard RX 2.2, Folplex, Folplex 2.2, Foltabs 800, Foltx, Homocysteine Formula, Niva-Fol, NuFol, TL Gard RX, Virt-Gard, Virt-Vite, Virt-Vite Forte, Vita-Respa What should I tell my health care provider before I take this medicine? They need to know if you have any of these conditions:  bleeding or clotting disorder  history of anemia of any type  other chronic health condition  an unusual or allergic reaction to vitamins, other medicines, foods, dyes, or preservatives  pregnant or trying to get pregnant  breast-feeding How should I use this medicine? Take by mouth with a glass of water. May take with food. Follow the directions on the prescription label. It is usually given once a day. Do not take your medicine more often than directed. Contact your pediatrician regarding the use of this medicine in children. Special care may be needed. Overdosage: If you think you have taken too much of this medicine contact a poison control center or emergency room at once. NOTE: This medicine is only for you. Do not share this medicine with others. What if I miss a dose? If you miss a dose, take it as soon as you can. If it is almost time for your next dose, take only that dose. Do not take double or extra doses. What may interact with this medicine?  levodopa This list may not describe all possible interactions. Give your health care provider a list of all the medicines, herbs, non-prescription drugs, or dietary supplements you use. Also tell them if you smoke, drink alcohol, or use illegal  drugs. Some items may interact with your medicine. What should I watch for while using this medicine? See your health care professional for regular checks on your progress. Remember that vitamin supplements do not replace the need for good nutrition from a balanced diet. What side effects may I notice from receiving this medicine? Side effects that you should report to your doctor or health care professional as soon as possible:  allergic reaction such as skin rash or difficulty breathing  vomiting Side effects that usually do not require medical attention (report to your doctor or health care professional if they continue or are bothersome):  nausea  stomach upset This list may not describe all possible side effects. Call your doctor for medical advice about side effects. You may report side effects to FDA at 1-800-FDA-1088. Where should I keep my medicine? Keep out of the reach of children. Most vitamins should be stored at controlled room temperature. Check your specific product directions. Protect from heat and moisture. Throw away any unused medicine after the expiration date. NOTE: This sheet is a summary. It may not cover all possible information. If you have questions about this medicine, talk to your doctor, pharmacist, or health care provider.  2020 Elsevier/Gold Standard (2007-09-20 00:59:55)  

## 2020-01-15 ENCOUNTER — Other Ambulatory Visit: Payer: BC Managed Care – PPO

## 2020-01-15 ENCOUNTER — Ambulatory Visit: Payer: BC Managed Care – PPO | Admitting: Hematology & Oncology

## 2020-01-15 ENCOUNTER — Ambulatory Visit: Payer: BC Managed Care – PPO

## 2020-01-20 ENCOUNTER — Other Ambulatory Visit: Payer: Self-pay | Admitting: Physician Assistant

## 2020-01-20 DIAGNOSIS — Z1231 Encounter for screening mammogram for malignant neoplasm of breast: Secondary | ICD-10-CM

## 2020-03-04 ENCOUNTER — Ambulatory Visit: Payer: BC Managed Care – PPO

## 2020-03-18 ENCOUNTER — Ambulatory Visit: Payer: Self-pay

## 2020-04-14 ENCOUNTER — Ambulatory Visit
Admission: RE | Admit: 2020-04-14 | Discharge: 2020-04-14 | Disposition: A | Discharge status: Home / Self Care | Payer: Self-pay | Source: Ambulatory Visit | Attending: Physician Assistant | Admitting: Physician Assistant

## 2020-04-14 ENCOUNTER — Other Ambulatory Visit: Payer: Self-pay

## 2020-04-14 DIAGNOSIS — Z1231 Encounter for screening mammogram for malignant neoplasm of breast: Secondary | ICD-10-CM

## 2021-03-22 ENCOUNTER — Other Ambulatory Visit: Payer: Self-pay | Admitting: Physician Assistant

## 2021-03-22 DIAGNOSIS — Z1231 Encounter for screening mammogram for malignant neoplasm of breast: Secondary | ICD-10-CM

## 2021-05-11 ENCOUNTER — Other Ambulatory Visit: Payer: Self-pay

## 2021-05-11 ENCOUNTER — Encounter: Payer: Self-pay | Admitting: Family

## 2021-05-11 ENCOUNTER — Ambulatory Visit
Admission: RE | Admit: 2021-05-11 | Discharge: 2021-05-11 | Disposition: A | Discharge status: Home / Self Care | Payer: 59 | Source: Ambulatory Visit | Attending: Physician Assistant | Admitting: Physician Assistant

## 2021-05-11 DIAGNOSIS — Z1231 Encounter for screening mammogram for malignant neoplasm of breast: Secondary | ICD-10-CM

## 2021-08-10 ENCOUNTER — Encounter: Payer: Self-pay | Admitting: Family

## 2021-09-15 ENCOUNTER — Encounter: Payer: Self-pay | Admitting: Internal Medicine

## 2021-10-06 ENCOUNTER — Other Ambulatory Visit: Payer: Self-pay

## 2021-10-06 ENCOUNTER — Encounter: Payer: Self-pay | Admitting: Family

## 2021-10-06 ENCOUNTER — Ambulatory Visit (AMBULATORY_SURGERY_CENTER): Payer: Self-pay

## 2021-10-06 VITALS — Ht 68.0 in | Wt 227.0 lb

## 2021-10-06 DIAGNOSIS — Z86010 Personal history of colonic polyps: Secondary | ICD-10-CM

## 2021-10-06 NOTE — Progress Notes (Signed)
No egg or soy allergy known to patient  No issues known to pt with past sedation with any surgeries or procedures Patient denies ever being told they had issues or difficulty with intubation  No FH of Malignant Hyperthermia Pt is not on diet pills Pt is not on home 02  Pt is not on blood thinners  Pt denies issues with constipation  No A fib or A flutter Pt is fully vaccinated for Covid x 2 + boosters; NO PA's for preps discussed with pt in PV today  Discussed with pt there will be an out-of-pocket cost for prep and that varies from $0 to 70 + dollars - pt verbalized understanding  Due to the COVID-19 pandemic we are asking patients to follow certain guidelines in PV and the [facility]   Pt aware of COVID protocols and [facility] guidelines  PV completed over the phone. Pt verified name, DOB, address and insurance during PV today.  Pt mailed instruction packet with copy of consent form to read and not return, and instructions- patient requested the office not send the instructions; Pt encouraged to call with questions or issues.  If pt has My chart, procedure instructions sent via My Chart

## 2021-10-12 ENCOUNTER — Encounter: Payer: Self-pay | Admitting: Family

## 2021-10-20 ENCOUNTER — Encounter: Payer: Self-pay | Admitting: Internal Medicine

## 2021-10-20 ENCOUNTER — Other Ambulatory Visit: Payer: Self-pay

## 2021-10-20 ENCOUNTER — Ambulatory Visit (AMBULATORY_SURGERY_CENTER): Payer: 59 | Admitting: Internal Medicine

## 2021-10-20 VITALS — BP 105/67 | HR 72 | Temp 97.3°F | Resp 14 | Ht 68.0 in | Wt 227.0 lb

## 2021-10-20 DIAGNOSIS — Z86010 Personal history of colonic polyps: Secondary | ICD-10-CM | POA: Diagnosis present

## 2021-10-20 MED ORDER — SODIUM CHLORIDE 0.9 % IV SOLN: 500.0000 mL | Freq: Once | INTRAVENOUS | Status: DC

## 2021-10-20 NOTE — Progress Notes (Signed)
[facility] History and Physical ? ? ?Primary Care Physician:  Harrison Mons, PA ? ? ?Reason for Procedure:   Hx polyp ? ?Plan:    colonoscopy ? ? ? ? ?HPI: [name] is a 56 y.o. female here for polyp surveillance exam w. Hx adenoma removal in 2017 ? ?Having hyperglycemia w/ DM ? ? ?Past Medical History:  ?Diagnosis Date  ? Anemia   ? Anxiety   ? on meds  ? Diabetes mellitus without complication ([place])   ? on meds  ? History of blood transfusion 2001  ? Hx of adenomatous polyp of colon 03/08/2016  ? Hyperlipidemia   ? on meds  ? Hypertension   ? on meds  ? 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 Pernicious anemia 07/02/2011  ? Thyroid activity decreased   ? on meds  ? Vitamin B12 deficiency   ? ? ?Past Surgical History:  ?Procedure Laterality Date  ? COLONOSCOPY  2017  ? CG-MAC-Miralax(good)-TA  ? POLYPECTOMY  2017  ? TA  ? TOTAL ABDOMINAL HYSTERECTOMY W/ BILATERAL SALPINGOOPHORECTOMY  2009  ? ? ?Prior to Admission medications   ?Medication Sig Start Date End Date Taking? Authorizing Provider  ?atorvastatin (LIPITOR) 10 MG tablet Take 1 tablet by mouth daily at 6 (six) AM. 09/25/21  Yes [provider]  ?fenofibrate 54 MG tablet TAKE 2 TABLETS(108 MG) BY MOUTH DAILY 01/03/18  Yes Harrison Mons, PA  ?levothyroxine (SYNTHROID, LEVOTHROID) 75 MCG tablet TAKE 1 TABLET(75 MCG) BY MOUTH DAILY 11/18/17  Yes Jeffery, Domingo Mend, PA  ?lisinopril-hydrochlorothiazide (PRINZIDE,ZESTORETIC) 10-12.5 MG tablet TAKE 1 TABLET BY MOUTH DAILY 12/09/17  Yes Harrison Mons, PA  ?metFORMIN (GLUCOPHAGE) 500 MG tablet Take 1,000 mg by mouth 2 (two) times a day. Take two tablets by mouth (1000 mg) two times a day with meals. 07/14/18  Yes [provider]  ?Multiple Vitamin (MULTIVITAMIN) tablet Take 1 tablet by mouth daily.     Yes [provider]  ?pioglitazone (ACTOS) 15 MG tablet Take 1 tablet by mouth daily at 6 (six) AM. 05/12/21  Yes [provider]  ?sertraline (ZOLOFT) 50 MG tablet  TAKE 1 TABLET(50 MG) BY MOUTH DAILY 12/13/17  Yes Harrison Mons, PA  ?vitamin C (ASCORBIC ACID) 500 MG tablet Take 1,000 mg by mouth daily.    Yes [provider]  ?VITAMIN D PO Take 2,000 Units by mouth daily. 2000   Yes [provider]  ? ? ?Current Outpatient Medications  ?Medication Sig Dispense Refill  ? atorvastatin (LIPITOR) 10 MG tablet Take 1 tablet by mouth daily at 6 (six) AM.    ? fenofibrate 54 MG tablet TAKE 2 TABLETS(108 MG) BY MOUTH DAILY 180 tablet 0  ? levothyroxine (SYNTHROID, LEVOTHROID) 75 MCG tablet TAKE 1 TABLET(75 MCG) BY MOUTH DAILY 90 tablet 0  ? lisinopril-hydrochlorothiazide (PRINZIDE,ZESTORETIC) 10-12.5 MG tablet TAKE 1 TABLET BY MOUTH DAILY 90 tablet 0  ? metFORMIN (GLUCOPHAGE) 500 MG tablet Take 1,000 mg by mouth 2 (two) times a day. Take two tablets by mouth (1000 mg) two times a day with meals.    ? Multiple Vitamin (MULTIVITAMIN) tablet Take 1 tablet by mouth daily.      ? pioglitazone (ACTOS) 15 MG tablet Take 1 tablet by mouth daily at 6 (six) AM.    ? sertraline (ZOLOFT) 50 MG tablet TAKE 1 TABLET(50 MG) BY MOUTH DAILY 90 tablet 0  ? vitamin C (ASCORBIC ACID) 500 MG tablet Take 1,000 mg by mouth daily.     ? VITAMIN  D PO Take 2,000 Units by mouth daily. 2000    ? ?Current Facility-Administered Medications  ?Medication Dose Route Frequency Provider Last Rate Last Admin  ? 0.9 %  sodium chloride infusion  500 mL Intravenous Once Gatha Mayer, MD      ? ? ?Allergies as of 10/20/2021  ? (No Known Allergies)  ? ? ?Family History  ?Adopted: Yes  ?Problem Relation Age of Onset  ? Colon cancer Neg Hx   ? Colon polyps Neg Hx   ? Esophageal cancer Neg Hx   ? Rectal cancer Neg Hx   ? Stomach cancer Neg Hx   ? ? ?Social History  ? ?Socioeconomic History  ? Marital status: Single  ?  Spouse name: n/a  ? Number of children: 0  ? Years of education: 12th grade  ? Highest education level: Not on file  ?Occupational History  ? Occupation: Unemployed.  ?  Employer: [employer]   ?  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 Smoking status: Never  ? Smokeless tobacco: Never  ?Vaping Use  ? Vaping Use: Never used  ?Substance and Sexual Activity  ? Alcohol use: No  ?  Alcohol/week: 0.0 standard drinks  ? Drug use: No  ? Sexual activity: Never  ?Other Topics Concern  ? Not on file  ?Social History Narrative  ? Lives with and cares for her parents.  ? Exercise: Walks 1-3 times a week for 30 minutes.  ? ?Social Determinants of Health  ? ?Financial Resource Strain: Not on file  ?Food Insecurity: Not on file  ?Transportation Needs: Not on file  ?Physical Activity: Not on file  ?Stress: Not on file  ?Social Connections: Not on file  ?Intimate Partner Violence: Not on file  ? ? ?Review of Systems: ? ?All other review of systems negative except as mentioned in the HPI. ? ?Physical Exam: ?Vital signs ?BP (!) 143/95 (BP Location: Right Arm, Patient Position: Sitting, Cuff Size: Normal)   Pulse 74   Temp (!) 97.3 ?F (36.3 ?C) (Temporal)   Ht _0  (1.727 m)   Wt 227 lb (103 kg)   SpO2 96%   BMI 34.52 kg/m?  ? ?General:   Alert,  Well-developed, well-nourished, pleasant and cooperative in NAD ?Lungs:  Clear throughout to auscultation.   ?Heart:  Regular rate and rhythm; no murmurs, clicks, rubs,  or gallops. ?Abdomen:  Soft, nontender and nondistended. Normal bowel sounds.   ?Neuro/Psych:  Alert and cooperative. Normal mood and affect. A and O x 3 ? ? ?_1  E. Carlean Purl, MD, [facility] ?[facility] ?[phone] (pager) ?10/20/2021 8:00 AM@ ? ?

## 2021-10-20 NOTE — Patient Instructions (Addendum)
No polyps today. ?Next routine colonoscopy or other screening test in 10 years - 2033. ? ?You do have diverticulosis - thickened muscle rings and pouches in the colon wall. Please read the handout about this condition. ? ? ?I appreciate the opportunity to care for you. ? ?Gatha Mayer, MD, [facility] ? ? ?YOU HAD AN ENDOSCOPIC PROCEDURE TODAY AT [facility]:   Refer to the procedure report that was given to you for any specific questions about what was found during the examination.  If the procedure report does not answer your questions, please call your gastroenterologist to clarify.  If you requested that your care partner not be given the details of your procedure findings, then the procedure report has been included in a sealed envelope for you to review at your convenience later. ? ?YOU SHOULD EXPECT: Some feelings of bloating in the abdomen. Passage of more gas than usual.  Walking can help get rid of the air that was put into your GI tract during the procedure and reduce the bloating. If you had a lower endoscopy (such as a colonoscopy or flexible sigmoidoscopy) you may notice spotting of blood in your stool or on the toilet paper. If you underwent a bowel prep for your procedure, you may not have a normal bowel movement for a few days. ? ?Please Note:  You might notice some irritation and congestion in your nose or some drainage.  This is from the oxygen used during your procedure.  There is no need for concern and it should clear up in a day or so. ? ?SYMPTOMS TO REPORT IMMEDIATELY: ? ?Following lower endoscopy (colonoscopy or flexible sigmoidoscopy): ? Excessive amounts of blood in the stool ? Significant tenderness or worsening of abdominal pains ? Swelling of the abdomen that is new, acute ? Fever of 100?F or higher ? ?For urgent or emergent issues, a gastroenterologist can be reached at any hour by calling [phone]. ?Do not use MyChart messaging for urgent concerns.  ? ? ?DIET:  We  do recommend a small meal at first, but then you may proceed to your regular diet.  Drink plenty of fluids but you should avoid alcoholic beverages for 24 hours. ? ?ACTIVITY:  You should plan to take it easy for the rest of today and you should NOT DRIVE or use heavy machinery until tomorrow (because of the sedation medicines used during the test).   ? ?FOLLOW UP: ?Our staff will call the number listed on your records 48-72 hours following your procedure to check on you and address any questions or concerns that you may have regarding the information given to you following your procedure. If we do not reach you, we will leave a message.  We will attempt to reach you two times.  During this call, we will ask if you have developed any symptoms of COVID 19. If you develop any symptoms (ie: fever, flu-like symptoms, shortness of breath, cough etc.) before then, please call [phone].  If you test positive for Covid 19 in the 2 weeks post procedure, please call and report this information to [place].   ? ?If any biopsies were taken you will be contacted by phone or by letter within the next 1-3 weeks.  Please call us at [phone] if you have not heard about the biopsies in 3 weeks.  ? ? ?SIGNATURES/CONFIDENTIALITY: ?You and/or your care partner have signed paperwork which will be entered into your electronic medical record.  These signatures attest  to the fact that that the information above on your After Visit Summary has been reviewed and is understood.  Full responsibility of the confidentiality of this discharge information lies with you and/or your care-partner. ? ?

## 2021-10-20 NOTE — Op Note (Signed)
[facility] ?Patient Name: [name] ?Procedure Date: 10/20/2021 7:22 AM ?MRN: [id number] ?Endoscopist: Gatha Mayer , MD ?Age: 56 ?Referring MD:  ?Date of Birth: 02-24-1966 ?Gender: Female ?Account #: [id number] ?Procedure:                Colonoscopy ?Indications:              Surveillance: Personal history of adenomatous  ?                          polyps on last colonoscopy > 5 years ago, Last  ?                          colonoscopy: 2017 ?Medicines:                Propofol per Anesthesia, Monitored Anesthesia Care ?Procedure:                Pre-Anesthesia Assessment: ?                          - Prior to the procedure, a History and Physical  ?                          was performed, and patient medications and  ?                          allergies were reviewed. The patient's tolerance of  ?                          previous anesthesia was also reviewed. The risks  ?                          and benefits of the procedure and the sedation  ?                          options and risks were discussed with the patient.  ?                          All questions were answered, and informed consent  ?                          was obtained. Prior Anticoagulants: The patient has  ?                          taken no previous anticoagulant or antiplatelet  ?                          agents. ASA Grade Assessment: III - A patient with  ?                          severe systemic disease. After reviewing the risks  ?                          and benefits, the patient was deemed in  ?  satisfactory condition to undergo the procedure. ?                          After obtaining informed consent, the colonoscope  ?                          was passed under direct vision. Throughout the  ?                          procedure, the patient's blood pressure, pulse, and  ?                          oxygen saturations were monitored continuously. The  ?                          Olympus CF-[id number]  (#[id number]) Colonoscope was  ?                          introduced through the anus and advanced to the the  ?                          cecum, identified by appendiceal orifice and  ?                          ileocecal valve. The colonoscopy was performed  ?                          without difficulty. The patient tolerated the  ?                          procedure well. The quality of the bowel  ?                          preparation was good. The ileocecal valve,  ?                          appendiceal orifice, and rectum were photographed.  ?                          The bowel preparation used was Miralax via split  ?                          dose instruction. ?Scope In: 8:08:10 AM ?Scope Out: 8:20:45 AM ?Scope Withdrawal Time: 0 hours 9 minutes 48 seconds  ?Total Procedure Duration: 0 hours 12 minutes 35 seconds  ?Findings:                 The perianal and digital rectal examinations were  ?                          normal. ?                          Multiple diverticula were found in the sigmoid  ?  colon. ?                          The exam was otherwise without abnormality on  ?                          direct and retroflexion views. ?Complications:            No immediate complications. ?Estimated Blood Loss:     Estimated blood loss: none. ?Impression:               - Diverticulosis in the sigmoid colon. ?                          - The examination was otherwise normal on direct  ?                          and retroflexion views. ?                          - No specimens collected. ?Recommendation:           - Patient has a contact number available for  ?                          emergencies. The signs and symptoms of potential  ?                          delayed complications were discussed with the  ?                          patient. Return to normal activities tomorrow.  ?                          Written discharge instructions were provided to the  ?                           patient. ?                          - Resume previous diet. ?                          - Continue present medications. ?                          - Repeat colonoscopy in 10 years. ?Gatha Mayer, MD ?10/20/2021 8:31:40 AM ?This report has been signed electronically. ?

## 2021-10-20 NOTE — Progress Notes (Signed)
VSS, transported to PACU °

## 2021-10-20 NOTE — Progress Notes (Signed)
Vitals-JD ? ?Pt's states no medical or surgical changes since previsit or office visit.  ?CBG 345. CRNA notified, and is speaking to patient.  Patient is asymptomatic at this time. CRNA will relay the information to Dr Carlean Purl. ?

## 2021-10-24 ENCOUNTER — Telehealth: Payer: Self-pay | Admitting: *Deleted

## 2021-10-24 NOTE — Telephone Encounter (Signed)
No answer for post procedure follow up call. ?

## 2021-10-24 NOTE — Telephone Encounter (Signed)
?  Follow up Call- ? ?Call back number 10/20/2021  ?Post procedure Call Back phone  # [phone]  ?Permission to leave phone message Yes  ?Some recent data might be hidden  ?  ? ?Patient questions: ? ?Do you have a fever, pain , or abdominal swelling? No. ?Pain Score  0 * ? ?Have you tolerated food without any problems? Yes.   ? ?Have you been able to return to your normal activities? Yes.   ? ?Do you have any questions about your discharge instructions: ?Diet   No. ?Medications  No. ?Follow up visit  No. ? ?Do you have questions or concerns about your Care? No. ? ?Actions: ?* If pain score is 4 or above: ?No action needed, pain <4. ? ? ?

## 2021-12-02 DIAGNOSIS — K5790 Diverticulosis of intestine, part unspecified, without perforation or abscess without bleeding: Secondary | ICD-10-CM | POA: Insufficient documentation

## 2021-12-18 ENCOUNTER — Ambulatory Visit (HOSPITAL_BASED_OUTPATIENT_CLINIC_OR_DEPARTMENT_OTHER)
Admission: EM | Admit: 2021-12-18 | Discharge: 2021-12-19 | Disposition: A | Discharge status: Home / Self Care | Payer: 59 | Attending: Internal Medicine | Admitting: Internal Medicine

## 2021-12-18 ENCOUNTER — Encounter (HOSPITAL_BASED_OUTPATIENT_CLINIC_OR_DEPARTMENT_OTHER): Payer: Self-pay

## 2021-12-18 DIAGNOSIS — E119 Type 2 diabetes mellitus without complications: Secondary | ICD-10-CM | POA: Insufficient documentation

## 2021-12-18 DIAGNOSIS — K2950 Unspecified chronic gastritis without bleeding: Secondary | ICD-10-CM | POA: Insufficient documentation

## 2021-12-18 DIAGNOSIS — Z7984 Long term (current) use of oral hypoglycemic drugs: Secondary | ICD-10-CM | POA: Diagnosis not present

## 2021-12-18 DIAGNOSIS — T18128A Food in esophagus causing other injury, initial encounter: Secondary | ICD-10-CM | POA: Diagnosis present

## 2021-12-18 DIAGNOSIS — K31A11 Gastric intestinal metaplasia without dysplasia, involving the antrum: Secondary | ICD-10-CM | POA: Insufficient documentation

## 2021-12-18 DIAGNOSIS — T18108A Unspecified foreign body in esophagus causing other injury, initial encounter: Secondary | ICD-10-CM

## 2021-12-18 DIAGNOSIS — K3189 Other diseases of stomach and duodenum: Secondary | ICD-10-CM | POA: Diagnosis not present

## 2021-12-18 DIAGNOSIS — K222 Esophageal obstruction: Secondary | ICD-10-CM | POA: Diagnosis not present

## 2021-12-18 DIAGNOSIS — I10 Essential (primary) hypertension: Secondary | ICD-10-CM | POA: Diagnosis not present

## 2021-12-18 LAB — BASIC METABOLIC PANEL
Anion gap: 15 (ref 5–15)
BUN: 19 mg/dL (ref 6–20)
CO2: 19 mmol/L — ABNORMAL LOW (ref 22–32)
Calcium: 9.8 mg/dL (ref 8.9–10.3)
Chloride: 101 mmol/L (ref 98–111)
Creatinine, Ser: 0.78 mg/dL (ref 0.44–1.00)
GFR, Estimated: >60 mL/min (ref >60)
Glucose, Bld: 368 mg/dL — ABNORMAL HIGH (ref 70–99)
Potassium: 3.8 mmol/L (ref 3.5–5.1)
Sodium: 135 mmol/L (ref 135–145)

## 2021-12-18 LAB — CBC WITH DIFFERENTIAL/PLATELET
Abs Immature Granulocytes: 0.02 10*3/uL (ref 0.00–0.07)
Basophils Absolute: 0.1 10*3/uL (ref 0.0–0.1)
Basophils Relative: 1 %
Eosinophils Absolute: 0.1 10*3/uL (ref 0.0–0.5)
Eosinophils Relative: 1 %
HCT: 40.7 % (ref 36.0–46.0)
Hemoglobin: 13.6 g/dL (ref 12.0–15.0)
Immature Granulocytes: 0 %
Lymphocytes Relative: 15 %
Lymphs Abs: 1.4 10*3/uL (ref 0.7–4.0)
MCH: 26.1 pg (ref 26.0–34.0)
MCHC: 33.4 g/dL (ref 30.0–36.0)
MCV: 78.1 fL — ABNORMAL LOW (ref 80.0–100.0)
Monocytes Absolute: 0.5 10*3/uL (ref 0.1–1.0)
Monocytes Relative: 6 %
Neutro Abs: 7.4 10*3/uL (ref 1.7–7.7)
Neutrophils Relative %: 77 %
Platelets: 204 10*3/uL (ref 150–400)
RBC: 5.21 MIL/uL — ABNORMAL HIGH (ref 3.87–5.11)
RDW: 13.8 % (ref 11.5–15.5)
WBC: 9.4 10*3/uL (ref 4.0–10.5)
nRBC: 0 % (ref 0.0–0.2)

## 2021-12-18 MED ORDER — GLUCAGON HCL RDNA (DIAGNOSTIC) 1 MG IJ SOLR
1.0000 mg | Freq: Once | INTRAMUSCULAR | Status: AC
  Administered 2021-12-18: 1 mg via INTRAVENOUS
  Filled 2021-12-18: qty 1

## 2021-12-18 MED ORDER — SODIUM CHLORIDE 0.9 % NICU IV INFUSION SIMPLE
INJECTION | INTRAVENOUS | Status: DC
  Filled 2021-12-18 (×2): qty 500

## 2021-12-18 NOTE — ED Triage Notes (Signed)
Pt states she was eating chicken and is caught in throat.  Pt is able to swallow her saliva but unable to drink water.   ?NAD in triage ?

## 2021-12-18 NOTE — ED Notes (Addendum)
Food stuck in esophagus. Breathing normal and having no airway obstruction. ?

## 2021-12-18 NOTE — ED Notes (Signed)
Glucagon ordered IV by MD. ?

## 2021-12-18 NOTE — ED Provider Notes (Signed)
?[facility] ?Provider Note ? ? ?CSN: [id number] ?Arrival date & time: 12/18/21  2104 ? ?  ? ?History ? ?Chief Complaint  ?Patient presents with  ? food bolus  ? ? ?[name] is a 56 y.o. female. ? ?Patient presents to hospital chief complaint of feeling her food stuck in her esophagus.  She had dinner which involved some chicken and when she swallowed it she felt like it got stuck in her throat.  She waited several hours and still would not pass.  She tried drinking some water and immediately threw it up.  She said similar thing happened many many years ago but resolved spontaneously.  She does see [facility] for routine colonoscopy evaluations.  Otherwise denies any fevers or cough.  Denies diarrhea or any pain at this time.  She is able to swallow her saliva but any kind of water she tries to swallow she has vomited up she states. ? ? ?  ? ?Home Medications ?Prior to Admission medications   ?Medication Sig Start Date End Date Taking? Authorizing Provider  ?atorvastatin (LIPITOR) 10 MG tablet Take 1 tablet by mouth daily at 6 (six) AM. 09/25/21   [provider]  ?fenofibrate 54 MG tablet TAKE 2 TABLETS(108 MG) BY MOUTH DAILY 01/03/18   Harrison Mons, PA  ?levothyroxine (SYNTHROID, LEVOTHROID) 75 MCG tablet TAKE 1 TABLET(75 MCG) BY MOUTH DAILY 11/18/17   Harrison Mons, PA  ?lisinopril-hydrochlorothiazide (PRINZIDE,ZESTORETIC) 10-12.5 MG tablet TAKE 1 TABLET BY MOUTH DAILY 12/09/17   Harrison Mons, PA  ?metFORMIN (GLUCOPHAGE) 500 MG tablet Take 1,000 mg by mouth 2 (two) times a day. Take two tablets by mouth (1000 mg) two times a day with meals. 07/14/18   [provider]  ?Multiple Vitamin (MULTIVITAMIN) tablet Take 1 tablet by mouth daily.      [provider]  ?pioglitazone (ACTOS) 15 MG tablet Take 1 tablet by mouth daily at 6 (six) AM. 05/12/21   [provider]  ?sertraline (ZOLOFT) 50 MG tablet TAKE 1 TABLET(50 MG) BY MOUTH DAILY  12/13/17   Harrison Mons, PA  ?vitamin C (ASCORBIC ACID) 500 MG tablet Take 1,000 mg by mouth daily.     [provider]  ?VITAMIN D PO Take 2,000 Units by mouth daily. 2000    [provider]  ?   ? ?Allergies    ?Patient has no known allergies.   ? ?Review of Systems   ?Review of Systems  ?Constitutional:  Negative for fever.  ?HENT:  Negative for ear pain.   ?Eyes:  Negative for pain.  ?Respiratory:  Negative for cough.   ?Cardiovascular:  Negative for chest pain.  ?Gastrointestinal:  Negative for abdominal pain.  ?Genitourinary:  Negative for flank pain.  ?Musculoskeletal:  Negative for back pain.  ?Skin:  Negative for rash.  ?Neurological:  Negative for headaches.  ? ?Physical Exam ?Updated Vital Signs ?BP 130/85 (BP Location: Right Arm)   Pulse 87   Temp 98.1 ?F (36.7 ?C) (Oral)   Resp 16   Ht '5\' 8"'$  (1.727 m)   Wt 103.4 kg   SpO2 97%   BMI 34.67 kg/m?  ?Physical Exam ?Constitutional:   ?   General: She is not in acute distress. ?   Appearance: Normal appearance.  ?HENT:  ?   Head: Normocephalic.  ?   Nose: Nose normal.  ?Eyes:  ?   Extraocular Movements: Extraocular movements intact.  ?Cardiovascular:  ?   Rate and Rhythm: Normal rate.  ?  Pulmonary:  ?   Effort: Pulmonary effort is normal.  ?Musculoskeletal:     ?   General: Normal range of motion.  ?   Cervical back: Normal range of motion.  ?Neurological:  ?   General: No focal deficit present.  ?   Mental Status: She is alert. Mental status is at baseline.  ? ? ?ED Results / Procedures / Treatments   ?Labs ?(all labs ordered are listed, but only abnormal results are displayed) ?Labs Reviewed  ?CBC WITH DIFFERENTIAL/PLATELET - Abnormal; Notable for the following components:  ?    Result Value  ? RBC 5.21 (*)   ? MCV 78.1 (*)   ? All other components within normal limits  ?BASIC METABOLIC PANEL - Abnormal; Notable for the following components:  ? CO2 19 (*)   ? Glucose, Bld 368 (*)   ? All other components within normal limits   ? ? ?EKG ?None ? ?Radiology ?No results found. ? ?Procedures ?Procedures  ? ? ?Medications Ordered in ED ?Medications  ?sodium chloride 0.9 % IV infusion (has no administration in time range)  ?glucagon (human recombinant) (GLUCAGEN) injection 1 mg (1 mg Intravenous Given 12/18/21 2210)  ? ? ?ED Course/ Medical Decision Making/ A&P ?  ?                        ?Medical Decision Making ?Amount and/or Complexity of Data Reviewed ?Labs: ordered. ? ?Risk ?Prescription drug management. ? ? ?Chart review shows procedure visit October 20, 2021 for colonoscopy. ? ?Work-up includes labs White count 9 hemoglobin 13 chemistry unremarkable. ? ?Patient given glucagon and subsequently tested with some p.o. water but still unable to tolerate water to vomiting it up immediately and still feeling foreign body sensation in her throat. ? ?Case discussed with on-call GI with [place] Dr.Stark, plans on endoscopy in the morning at [facility].  Requesting medical admission overnight. ? ?Medicine consulted for evaluation. ? ? ? ? ? ? ? ?Final Clinical Impression(s) / ED Diagnoses ?Final diagnoses:  ?Foreign body in esophagus, initial encounter  ? ? ?Rx / DC Orders ?ED Discharge Orders   ? ? None  ? ?  ? ? ?  ?Luna Fuse, MD ?12/18/21 2304 ? ?

## 2021-12-19 ENCOUNTER — Encounter (HOSPITAL_COMMUNITY)
Admission: EM | Disposition: A | Discharge status: Home / Self Care | Payer: Self-pay | Source: Home / Self Care | Attending: Emergency Medicine

## 2021-12-19 ENCOUNTER — Encounter (HOSPITAL_COMMUNITY): Payer: Self-pay | Admitting: Internal Medicine

## 2021-12-19 ENCOUNTER — Emergency Department (HOSPITAL_COMMUNITY): Payer: 59 | Admitting: Certified Registered"

## 2021-12-19 ENCOUNTER — Emergency Department (EMERGENCY_DEPARTMENT_HOSPITAL): Payer: 59 | Admitting: Certified Registered"

## 2021-12-19 DIAGNOSIS — K31A11 Gastric intestinal metaplasia without dysplasia, involving the antrum: Secondary | ICD-10-CM | POA: Diagnosis not present

## 2021-12-19 DIAGNOSIS — E119 Type 2 diabetes mellitus without complications: Secondary | ICD-10-CM | POA: Diagnosis not present

## 2021-12-19 DIAGNOSIS — K222 Esophageal obstruction: Secondary | ICD-10-CM

## 2021-12-19 DIAGNOSIS — Z7984 Long term (current) use of oral hypoglycemic drugs: Secondary | ICD-10-CM | POA: Diagnosis not present

## 2021-12-19 DIAGNOSIS — K3189 Other diseases of stomach and duodenum: Secondary | ICD-10-CM

## 2021-12-19 DIAGNOSIS — I10 Essential (primary) hypertension: Secondary | ICD-10-CM | POA: Diagnosis not present

## 2021-12-19 DIAGNOSIS — T18128A Food in esophagus causing other injury, initial encounter: Secondary | ICD-10-CM

## 2021-12-19 DIAGNOSIS — K2950 Unspecified chronic gastritis without bleeding: Secondary | ICD-10-CM | POA: Diagnosis not present

## 2021-12-19 DIAGNOSIS — W44F3XA Food entering into or through a natural orifice, initial encounter: Secondary | ICD-10-CM | POA: Diagnosis present

## 2021-12-19 HISTORY — PX: ESOPHAGOGASTRODUODENOSCOPY (EGD) WITH PROPOFOL: SHX5813

## 2021-12-19 HISTORY — PX: FOREIGN BODY REMOVAL: SHX962

## 2021-12-19 HISTORY — PX: BIOPSY: SHX5522

## 2021-12-19 LAB — GLUCOSE, CAPILLARY
Glucose-Capillary: 323 mg/dL — ABNORMAL HIGH (ref 70–99)
Glucose-Capillary: 322 mg/dL — ABNORMAL HIGH (ref 70–99)
Glucose-Capillary: 283 mg/dL — ABNORMAL HIGH (ref 70–99)
Glucose-Capillary: 297 mg/dL — ABNORMAL HIGH (ref 70–99)

## 2021-12-19 SURGERY — ESOPHAGOGASTRODUODENOSCOPY (EGD) WITH PROPOFOL
Anesthesia: General

## 2021-12-19 MED ORDER — MIDAZOLAM HCL 2 MG/2ML IJ SOLN
INTRAMUSCULAR | Status: DC | PRN
  Administered 2021-12-19: 2 mg via INTRAVENOUS

## 2021-12-19 MED ORDER — AMISULPRIDE (ANTIEMETIC) 5 MG/2ML IV SOLN: 10.0000 mg | Freq: Once | INTRAVENOUS | Status: DC | PRN

## 2021-12-19 MED ORDER — FENTANYL CITRATE (PF) 250 MCG/5ML IJ SOLN
INTRAMUSCULAR | Status: DC | PRN
  Administered 2021-12-19 (×2): 50 ug via INTRAVENOUS

## 2021-12-19 MED ORDER — DEXAMETHASONE SODIUM PHOSPHATE 10 MG/ML IJ SOLN
INTRAMUSCULAR | Status: DC | PRN
  Administered 2021-12-19: 10 mg via INTRAVENOUS

## 2021-12-19 MED ORDER — SODIUM CHLORIDE 0.9 % IV SOLN: INTRAVENOUS | Status: DC | PRN

## 2021-12-19 MED ORDER — SUCCINYLCHOLINE CHLORIDE 200 MG/10ML IV SOSY
PREFILLED_SYRINGE | INTRAVENOUS | Status: DC | PRN
  Administered 2021-12-19: 140 mg via INTRAVENOUS

## 2021-12-19 MED ORDER — LIDOCAINE 2% (20 MG/ML) 5 ML SYRINGE
INTRAMUSCULAR | Status: DC | PRN
  Administered 2021-12-19: 100 mg via INTRAVENOUS

## 2021-12-19 MED ORDER — PROPOFOL 10 MG/ML IV BOLUS
INTRAVENOUS | Status: DC | PRN
  Administered 2021-12-19: 100 mg via INTRAVENOUS
  Administered 2021-12-19: 180 mg via INTRAVENOUS

## 2021-12-19 MED ORDER — INSULIN ASPART 100 UNIT/ML IJ SOLN
INTRAMUSCULAR | Status: DC | PRN
  Administered 2021-12-19: 10 [IU] via SUBCUTANEOUS

## 2021-12-19 MED ORDER — ONDANSETRON HCL 4 MG/2ML IJ SOLN
INTRAMUSCULAR | Status: DC | PRN
  Administered 2021-12-19: 4 mg via INTRAVENOUS

## 2021-12-19 SURGICAL SUPPLY — 15 items

## 2021-12-19 NOTE — H&P (View-Only) (Signed)
[facility] Consult Note ? ? ?History ?[name] ?MRN # [id number] ? ?Date of Admission: 12/18/2021 ?Date of Consultation: 12/19/2021 ?Referring physician: Dr. Cyd Silence, Sherryll Burger, MD ?Primary Care Provider: Harrison Mons, [place] ?Primary Gastroenterologist: Dr. Silvano Rusk at [facility] ? ? ?Reason for Consultation/Chief Complaint: Esophageal food bolus impaction ? ?Subjective  ?HPI: ? ?This is a 56 year old woman who presented to the driver EGD last evening with persistent feeling of difficulty swallowing and inability to tolerate liquids by mouth after supper last evening.  The ED physician contacted Dr. Fuller Plan and patient was transferred to [facility] for upper endoscopy. ?She has the persistent sensation of a fullness in the upper chest, denies shortness of breath and is talking normally without distress.  She denies abdominal pain.  She reports this occurred about 2 years ago but passed at home, and she has not felt as if there were episodes since then.  She also denies chronic reflux symptoms. ? ?Of note, her glucose was 368 in the ED last evening prior to receiving glucagon, and no insulin was given.  Her glucose in the endoscopy lab this morning is 323.  Her last dose of metformin was before supper last evening and last dose of Actos over 24 hours ago. ? ?ROS: ? ?Constitutional: No recent weight loss ? ? ?All other systems are negative except as noted above in the HPI ? ?Past Medical History ?Past Medical History:  ?Diagnosis Date  ? Anemia   ? Anxiety   ? on meds  ? Diabetes mellitus without complication ([place])   ? on meds  ? History of blood transfusion 2001  ? Hx of adenomatous polyp of colon 03/08/2016  ? Hyperlipidemia   ? on meds  ? Hypertension   ? on meds  ? 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 Pernicious anemia 07/02/2011  ? Thyroid activity decreased   ? on meds  ? Vitamin B12 deficiency   ? ? ?Past Surgical History ?Past Surgical History:  ?Procedure Laterality Date  ?  COLONOSCOPY  2017  ? CG-MAC-Miralax(good)-TA  ? POLYPECTOMY  2017  ? TA  ? TOTAL ABDOMINAL HYSTERECTOMY W/ BILATERAL SALPINGOOPHORECTOMY  2009  ? ? ?Family History ?Family History  ?Adopted: Yes  ?Problem Relation Age of Onset  ? Colon cancer Neg Hx   ? Colon polyps Neg Hx   ? Esophageal cancer Neg Hx   ? Rectal cancer Neg Hx   ? Stomach cancer Neg Hx   ? ? ?Social History ?Social History  ? ?Socioeconomic History  ? Marital status: Single  ?  Spouse name: n/a  ? Number of children: 0  ? Years of education: 12th grade  ? Highest education level: Not on file  ?Occupational History  ? Occupation: Unemployed.  ?  Employer: [employer]  ?  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 Smoking status: Never  ? Smokeless tobacco: Never  ?Vaping Use  ? Vaping Use: Never used  ?Substance and Sexual Activity  ? Alcohol use: No  ?  Alcohol/week: 0.0 standard drinks  ? Drug use: No  ? Sexual activity: Never  ?Other Topics Concern  ? Not on file  ?Social History Narrative  ? Lives with and cares for her parents.  ? Exercise: Walks 1-3 times a week for 30 minutes.  ? ?Social Determinants of Health  ? ?Financial Resource Strain: Not on file  ?Food Insecurity: Not on file  ?Transportation Needs: Not on file  ?Physical Activity: Not on file  ?  Stress: Not on file  ?Social Connections: Not on file  ? ? ?Allergies ?No Known Allergies ? ?Outpatient Meds ?Home medications from the H+P and/or nursing med reconciliation reviewed. ? ?Inpatient med list reviewed ? ?_____________________________________________________________________ ?Objective  ? ?Exam: ? ?Current vital signs ? ?Patient Vitals for the past 8 hrs: ? BP Temp Temp src Pulse Resp SpO2 Height Weight  ?12/19/21 0838 124/70 97.8 ?F (36.6 ?C) Temporal 79 (!) 22 98 % _0  (1.727 m) 103.4 kg  ?12/19/21 0732 128/67 98 ?F (36.7 ?C) Oral 84 18 99 % -- --  ?12/19/21 0615 (!) 117/56 97.7 ?F (36.5 ?C) Oral 78 18 100 % -- --  ?12/19/21 0515 122/78 -- -- 78 18 98 % -- --  ?12/19/21  0415 124/75 -- -- 79 18 98 % -- --  ?12/19/21 0300 131/83 -- -- 79 18 96 % -- --  ?12/19/21 0215 121/82 -- -- 82 18 97 % -- --  ?12/19/21 0130 125/73 -- -- 86 18 97 % -- --  ?12/19/21 0100 124/84 -- -- 78 17 100 % -- --  ? ?No intake or output data in the 24 hours ending 12/19/21 0850 ? ?Physical Exam: ? ? ?General: this is a well-appearing patient in no acute distress.  She has an emesis bag at the bedside.  She has normal vocal quality and is breathing comfortably on room air. ?Eyes: sclera anicteric, no redness ?ENT: oral mucosa moist without lesions, no cervical or supraclavicular lymphadenopathy, good dentition.  No crepitus. ?CV: RRR without murmur, S1/S2, no JVD,, no peripheral edema ?Resp: clear to auscultation bilaterally, normal RR and effort noted ?GI: soft, no tenderness, with active bowel sounds. No guarding or palpable organomegaly noted ?Skin; warm and dry, no rash or jaundice noted ?Neuro: awake, alert and oriented x 3. Normal gross motor function and fluent speech. ? ?Labs: ? ? ?  Latest Ref Rng & Units 12/18/2021  ?  9:37 PM 01/16/2019  ?  8:55 AM 01/17/2018  ?  8:36 AM  ?CBC  ?WBC 4.0 - 10.5 K/uL 9.4   6.8   6.7    ?Hemoglobin 12.0 - 15.0 g/dL 13.6   13.7   14.6    ?Hematocrit 36.0 - 46.0 % 40.7   42.2   42.7    ?Platelets 150 - 400 K/uL 204   230   200    ? ? ? ?  Latest Ref Rng & Units 12/18/2021  ?  9:37 PM 01/16/2019  ?  8:55 AM 01/17/2018  ?  8:36 AM  ?CMP  ?Glucose 70 - 99 mg/dL 368   157   182    ?BUN 6 - 20 mg/dL _1 ?Creatinine 0.44 - 1.00 mg/dL 0.78   0.74   0.90    ?Sodium 135 - 145 mmol/L 135   138   141    ?Potassium 3.5 - 5.1 mmol/L 3.8   4.0   3.9    ?Chloride 98 - 111 mmol/L 101   104   104    ?CO2 22 - 32 mmol/L _2 ?Calcium 8.9 - 10.3 mg/dL 9.8   9.4   9.7    ?Total Protein 6.5 - 8.1 g/dL  7.4   7.6    ?Total Bilirubin 0.3 - 1.2 mg/dL  0.4   0.6    ?Alkaline Phos 38 - 126 U/L  65   66    ?  AST 15 - 41 U/L  20   32    ?ALT 0 - 44 U/L  30   40    ? ? ?No results for  input(s): INR in the last 168 hours. ?_________________________________________________________ ?Radiologic studies: ? ? ?______________________________________________________ ?Other studies: ? ? ?_______________________________________________________ ?Assessment & Plan  ?Impression: ? ?Esophageal food bolus impaction ?Hyperglycemia in patient with type 2 diabetes -this is likely chronic, and Andree says she has between primary care providers at this point. ? ?Plan: ? ?Upper endoscopy under general anesthesia today.  She was agreeable after discussion of the procedure and risks. ? The benefits and risks of the planned procedure were described in detail with the patient or (when appropriate) their health care proxy.  Risks were outlined as including, but not limited to, bleeding, infection, perforation, adverse medication reaction leading to cardiac or pulmonary decompensation, pancreatitis (if ERCP).  The limitation of incomplete mucosal visualization was also discussed.  No guarantees or warranties were given. ? ?I discussed the hyperglycemia management with Dr. Daiva Huge of anesthesia, and they will administer 10 units of subcutaneous regular insulin by their established protocol.  She will then need to be monitored for 2 hours in the postop area prior to discharge. ?This patient then needs to immediately reestablish primary care for management of her poorly controlled diabetes. ? ?I anticipate she will likely be able to go home today after she recovers from anesthesia. ? ? ?Thank you for the courtesy of this consult.  Please contact me with any questions or concerns. ? ?[name] ?Office: [phone] ? ?

## 2021-12-19 NOTE — ED Notes (Signed)
Pt continues to cough and gag, NAD, VSS ?

## 2021-12-19 NOTE — Interval H&P Note (Signed)
History and Physical Interval Note: ? ?12/19/2021 ?9:02 AM ? ?[name]  has presented today for surgery, with the diagnosis of dysphagia, food impaction.  The various methods of treatment have been discussed with the patient and family. After consideration of risks, benefits and other options for treatment, the patient has consented to  Procedure(s): ?ESOPHAGOGASTRODUODENOSCOPY (EGD) WITH PROPOFOL (N/A) as a surgical intervention.  The patient's history has been reviewed, patient examined, no change in status, stable for surgery.  I have reviewed the patient's chart and labs.  Questions were answered to the patient's satisfaction.   ? ? ?[name] ? ? ?

## 2021-12-19 NOTE — Op Note (Signed)
[facility] ?Patient Name: [name] ?Procedure Date : 12/19/2021 ?MRN: [id number] ?Attending MD: Estill Cotta. Loletha Carrow , MD ?Date of Birth: Dec 23, 1965 ?CSN: [id number] ?Age: 56 ?Admit Type: Inpatient ?Procedure:                Upper GI endoscopy ?Indications:              Foreign body in the esophagus, (food bolus  ?                          impaction) ?Providers:                Estill Cotta. Loletha Carrow, MD, Katina Degree, RN, Elberta Fortis  ?                          Johnella Moloney, Technician ?Referring MD:              ?Medicines:                General Anesthesia ?Complications:            No immediate complications. ?Estimated Blood Loss:     Estimated blood loss was minimal. ?Procedure:                Pre-Anesthesia Assessment: ?                          - Prior to the procedure, a History and Physical  ?                          was performed, and patient medications and  ?                          allergies were reviewed. The patient's tolerance of  ?                          previous anesthesia was also reviewed. The risks  ?                          and benefits of the procedure and the sedation  ?                          options and risks were discussed with the patient.  ?                          All questions were answered, and informed consent  ?                          was obtained. Prior Anticoagulants: The patient has  ?                          taken no previous anticoagulant or antiplatelet  ?                          agents. ASA Grade Assessment: III - A patient with  ?  severe systemic disease. After reviewing the risks  ?                          and benefits, the patient was deemed in  ?                          satisfactory condition to undergo the procedure. ?                          After obtaining informed consent, the endoscope was  ?                          passed under direct vision. Throughout the  ?                          procedure, the patient's blood pressure,  pulse, and  ?                          oxygen saturations were monitored continuously. The  ?                          GIF-H190 ([id number]) Olympus endoscope was introduced  ?                          through the mouth, and advanced to the second part  ?                          of duodenum. The upper GI endoscopy was  ?                          accomplished without difficulty. The patient  ?                          tolerated the procedure well. ?Scope In: ?Scope Out: ?Findings: ?     Food was found in the upper third of the esophagus. Removal of food was  ?     accomplished (piecemeal with 4-prong grasper). ?     One benign-appearing, intrinsic moderate stenosis (concentric rings) was  ?     found 17 cm from the incisors. The tissue was also edematous from the  ?     food impaction. This stenosis measured 1.2 cm (inner diameter) x 1 cm  ?     (in length). The stenosis was traversed. Biopsies were taken with a cold  ?     forceps for histology (which also served to disrupt the mucosal part of  ?     the stricture). Add'l Bx were taken from the distal esophagus to rule  ?     out EoE, though the remainder of esophageal mucosal appeared normal. (  ?     Location of the stricture just distal to the UES would have made balloon  ?     dilation challenging.) ?     A few 4 mm mucosal papules (nodules) were found in the gastric body.  ?     Biopsies were taken with a cold forceps for histology. Separate Bx taken  ?     from  antrum and gody (lesser and greater curves) to r/o H pylori. ?     The exam of the stomach was otherwise normal, including on retroflexion. ?     The examined duodenum was normal. ?Impression:               - Food in the upper third of the esophagus. Removal  ?                          was successful. ?                          - Benign-appearing esophageal stenosis. Biopsied. ?                          - A few mucosal papules (nodules) found in the  ?                          stomach. Biopsied. ?                           - Normal examined duodenum. ?Recommendation:           - Patient has a contact number available for  ?                          emergencies. The signs and symptoms of potential  ?                          delayed complications were discussed with the  ?                          patient. Return to normal activities tomorrow.  ?                          Written discharge instructions were provided to the  ?                          patient. She will be discharged from PACU after  ?                          further monitoring of glucose and recovery from  ?                          anesthesia. ?                          - Resume previous diet. Cut and chew food  ?                          (especially meat) more carefully. ?                          - Continue present medications. ?                          - Await pathology results, after which office  ?  follow up with Dr. Carlean Purl will be aranged. ?                          - See PCP ASAP for diabetes (see consult note re:  ?                          hyperglycemia) ?Procedure Code(s):        --- Professional --- ?                          [phone], Esophagogastroduodenoscopy, flexible,  ?                          transoral; with removal of foreign body(s) ?                          [id number], Esophagogastroduodenoscopy, flexible,  ?                          transoral; with biopsy, single or multiple ?Diagnosis Code(s):        --- Professional --- ?                          L24.401U, Food in esophagus causing other injury,  ?                          initial encounter ?                          K22.2, Esophageal obstruction ?                          K31.89, Other diseases of stomach and duodenum ?                          T18.108A, Unspecified foreign body in esophagus  ?                          causing other injury, initial encounter ?CPT copyright 2019 American Medical Association. All rights reserved. ?The codes documented in this  report are preliminary and upon coder review may  ?be revised to meet current compliance requirements. ?Riko Lumsden L. Loletha Carrow, MD ?12/19/2021 9:55:14 AM ?This report has been signed electronically. ?Number of Addenda: 0 ?

## 2021-12-19 NOTE — Discharge Instructions (Signed)
Our office will contact you with biopsy results and plans for follow-up with Dr. Carlean Purl. ? ?-Dr. Wilfrid Lund ?_________________________________________________________ ?

## 2021-12-19 NOTE — Anesthesia Postprocedure Evaluation (Signed)
Anesthesia Post Note ? ?Patient: [name] ? ?Procedure(s) Performed: ESOPHAGOGASTRODUODENOSCOPY (EGD) WITH PROPOFOL ?BIOPSY ? ?  ? ?Patient location during evaluation: PACU ?Anesthesia Type: General ?Level of consciousness: awake and alert ?Pain management: pain level controlled ?Vital Signs Assessment: post-procedure vital signs reviewed and stable ?Respiratory status: spontaneous breathing, nonlabored ventilation and respiratory function stable ?Cardiovascular status: blood pressure returned to baseline ?Postop Assessment: no apparent nausea or vomiting ?Anesthetic complications: no ? ? ?No notable events documented. ? ?Last Vitals:  ?Vitals:  ? 12/19/21 1006 12/19/21 1009  ?BP:  122/73  ?Pulse:  85  ?Resp: 18 17  ?Temp:    ?SpO2: 95% 95%  ?  ?Last Pain:  ?Vitals:  ? 12/19/21 0954  ?TempSrc:   ?PainSc: 0-No pain  ? ? ?  ?  ?  ?  ?  ?  ? ?Marthenia Rolling ? ? ? ? ?

## 2021-12-19 NOTE — Progress Notes (Signed)
Plan of Care Note for accepted transfer ? ? ?Patient: [name] MRN: [id number]   DOA: 12/18/2021 ? ?Facility requesting transfer: [facility] ?Requesting Provider: Dr. Almyra Free ?Reason for transfer: Esophageal Food Impaction ?Facility course:  ? ?55 year old female with past medical history of diabetes mellitus type 2, hypertension presenting to [place] with complaints of chest and throat discomfort after eating chicken. ? ?Patient failed any attempt to tolerate oral intake thereafter and presented to [facility] for evaluation. ? ?Upon evaluation in the emergency department patient was clinically felt to be suffering from sudden food impaction of the esophagus.  ER provider discussed case with Dr. Fuller Plan with [facility] gastroenterology who recommended transfer to [facility] with plans for gastroenterology to perform EGD the following morning. ? ?Plan of care: ?The patient is accepted for admission to [place]  unit, at [facility]..  ? ? ?Author: ?Vernelle Emerald, MD ?12/19/2021 ? ?Check [email or web address] for on-call coverage. ? ?Nursing staff, Please call [facility] number on Amion as soon as patient's arrival, so appropriate admitting provider can evaluate the pt. ?

## 2021-12-19 NOTE — Anesthesia Preprocedure Evaluation (Addendum)
Anesthesia Evaluation  ?Patient identified by MRN, date of birth, ID band ?Patient awake ? ? ? ?Reviewed: ?Allergy & Precautions, NPO status , Patient's Chart, lab work & pertinent test results ? ?History of Anesthesia Complications ?Negative for: history of anesthetic complications ? ?Airway ?Mallampati: II ? ?TM Distance: >3 FB ?Neck ROM: Full ? ? ? Dental ?no notable dental hx. ? ?  ?Pulmonary ?neg pulmonary ROS,  ?  ?Pulmonary exam normal ? ? ? ? ? ? ? Cardiovascular ?hypertension, Pt. on medications ?Normal cardiovascular exam ? ? ?  ?Neuro/Psych ?Anxiety Depression negative neurological ROS ?   ? GI/Hepatic ?Neg liver ROS, Food impaction ?  ?Endo/Other  ?diabetes, Type 2, Oral Hypoglycemic AgentsHypothyroidism  ? Renal/GU ?negative Renal ROS  ?negative genitourinary ?  ?Musculoskeletal ?negative musculoskeletal ROS ?(+)  ? Abdominal ?  ?Peds ? Hematology ?negative hematology ROS ?(+)   ?Anesthesia Other Findings ?Day of surgery medications reviewed with patient. ? Reproductive/Obstetrics ?negative OB ROS ? ?  ? ? ? ? ? ? ? ? ? ? ? ? ? ?  ?  ? ? ? ? ? ? ? ?Anesthesia Physical ?Anesthesia Plan ? ?ASA: 2 and emergent ? ?Anesthesia Plan: General  ? ?Post-op Pain Management:   ? ?Induction: Intravenous and Rapid sequence ? ?PONV Risk Score and Plan: 3 and Treatment may vary due to age or medical condition, Midazolam, Ondansetron and Dexamethasone ? ?Airway Management Planned: Oral ETT ? ?Additional Equipment: None ? ?Intra-op Plan:  ? ?Post-operative Plan: Extubation in OR ? ?Informed Consent: I have reviewed the patients History and Physical, chart, labs and discussed the procedure including the risks, benefits and alternatives for the proposed anesthesia with the patient or authorized representative who has indicated his/her understanding and acceptance.  ? ? ? ? ? ?Plan Discussed with: CRNA ? ?Anesthesia Plan Comments:   ? ? ? ? ? ?Anesthesia Quick Evaluation ? ?

## 2021-12-19 NOTE — Transfer of Care (Signed)
Immediate Anesthesia Transfer of Care Note ? ?Patient: [name] ? ?Procedure(s) Performed: ESOPHAGOGASTRODUODENOSCOPY (EGD) WITH PROPOFOL ?BIOPSY ? ?Patient Location: PACU ? ?Anesthesia Type:General ? ?Level of Consciousness: drowsy and patient cooperative ? ?Airway & Oxygen Therapy: Patient Spontanous Breathing ? ?Post-op Assessment: Report given to RN and Post -op Vital signs reviewed and stable ? ?Post vital signs: Reviewed and stable ? ?Last Vitals:  ?Vitals Value Taken Time  ?BP 122/61 12/19/21 0954  ?Temp    ?Pulse 91 12/19/21 0955  ?Resp 26 12/19/21 0955  ?SpO2 93 % 12/19/21 0955  ?Vitals shown include unvalidated device data. ? ?Last Pain:  ?Vitals:  ? 12/19/21 0838  ?TempSrc: Temporal  ?PainSc: 0-No pain  ?   ? ?  ? ?Complications: No notable events documented. ?

## 2021-12-19 NOTE — ED Notes (Signed)
Handoff report given to [facility] 

## 2021-12-19 NOTE — Anesthesia Procedure Notes (Signed)
Procedure Name: Intubation ?Date/Time: 12/19/2021 9:12 AM ?Performed by: Lance Coon, CRNA ?Pre-anesthesia Checklist: Patient identified, Emergency Drugs available, Suction available, Patient being monitored and Timeout performed ?Patient Re-evaluated:Patient Re-evaluated prior to induction ?Oxygen Delivery Method: Circle system utilized ?Preoxygenation: Pre-oxygenation with 100% oxygen ?Induction Type: IV induction, Rapid sequence and Cricoid Pressure applied ?Laryngoscope Size: Sabra Heck and 3 ?Tube type: Oral ?Tube size: 7.0 mm ?Number of attempts: 1 ?Airway Equipment and Method: Stylet ?Placement Confirmation: ETT inserted through vocal cords under direct vision, positive ETCO2 and breath sounds checked- equal and bilateral ?Secured at: 21 cm ?Tube secured with: Tape ?Dental Injury: Teeth and Oropharynx as per pre-operative assessment  ? ? ? ? ?

## 2021-12-19 NOTE — Consult Note (Addendum)
[facility] Consult Note ? ? ?History ?[name] ?MRN # [id number] ? ?Date of Admission: 12/18/2021 ?Date of Consultation: 12/19/2021 ?Referring physician: Dr. Shalhoub, George J, MD ?Primary Care Provider: Jeffery, Chelle, PA ?Primary Gastroenterologist: Dr. Carl Gessner at [facility] ? ? ?Reason for Consultation/Chief Complaint: Esophageal food bolus impaction ? ?Subjective  ?HPI: ? ?This is a 55-year-old woman who presented to the driver EGD last evening with persistent feeling of difficulty swallowing and inability to tolerate liquids by mouth after supper last evening.  The ED physician contacted Dr. Stark and patient was transferred to [place] for upper endoscopy. ?She has the persistent sensation of a fullness in the upper chest, denies shortness of breath and is talking normally without distress.  She denies abdominal pain.  She reports this occurred about 2 years ago but passed at home, and she has not felt as if there were episodes since then.  She also denies chronic reflux symptoms. ? ?Of note, her glucose was 368 in the ED last evening prior to receiving glucagon, and no insulin was given.  Her glucose in the endoscopy lab this morning is 323.  Her last dose of metformin was before supper last evening and last dose of Actos over 24 hours ago. ? ?ROS: ? ?Constitutional: No recent weight loss ? ? ?All other systems are negative except as noted above in the HPI ? ?Past Medical History ?Past Medical History:  ?Diagnosis Date  ? Anemia   ? Anxiety   ? on meds  ? Diabetes mellitus without complication (HCC)   ? on meds  ? History of blood transfusion 2001  ? Hx of adenomatous polyp of colon 03/08/2016  ? Hyperlipidemia   ? on meds  ? Hypertension   ? on meds  ? 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 Pernicious anemia 07/02/2011  ? Thyroid activity decreased   ? on meds  ? Vitamin B12 deficiency   ? ? ?Past Surgical History ?Past Surgical History:  ?Procedure Laterality Date  ?  COLONOSCOPY  2017  ? CG-MAC-Miralax(good)-TA  ? POLYPECTOMY  2017  ? TA  ? TOTAL ABDOMINAL HYSTERECTOMY W/ BILATERAL SALPINGOOPHORECTOMY  2009  ? ? ?Family History ?Family History  ?Adopted: Yes  ?Problem Relation Age of Onset  ? Colon cancer Neg Hx   ? Colon polyps Neg Hx   ? Esophageal cancer Neg Hx   ? Rectal cancer Neg Hx   ? Stomach cancer Neg Hx   ? ? ?Social History ?Social History  ? ?Socioeconomic History  ? Marital status: Single  ?  Spouse name: n/a  ? Number of children: 0  ? Years of education: 12th grade  ? Highest education level: Not on file  ?Occupational History  ? Occupation: Unemployed.  ?  Employer: [employer]  ?  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 Smoking status: Never  ? Smokeless tobacco: Never  ?Vaping Use  ? Vaping Use: Never used  ?Substance and Sexual Activity  ? Alcohol use: No  ?  Alcohol/week: 0.0 standard drinks  ? Drug use: No  ? Sexual activity: Never  ?Other Topics Concern  ? Not on file  ?Social History Narrative  ? Lives with and cares for her parents.  ? Exercise: Walks 1-3 times a week for 30 minutes.  ? ?Social Determinants of Health  ? ?Financial Resource Strain: Not on file  ?Food Insecurity: Not on file  ?Transportation Needs: Not on file  ?Physical Activity: Not on file  ?  Stress: Not on file  ?Social Connections: Not on file  ? ? ?Allergies ?No Known Allergies ? ?Outpatient Meds ?Home medications from the H+P and/or nursing med reconciliation reviewed. ? ?Inpatient med list reviewed ? ?_____________________________________________________________________ ?Objective  ? ?Exam: ? ?Current vital signs ? ?Patient Vitals for the past 8 hrs: ? BP Temp Temp src Pulse Resp SpO2 Height Weight  ?12/19/21 0838 124/70 97.8 ?F (36.6 ?C) Temporal 79 (!) 22 98 % 5' 8" (1.727 m) 103.4 kg  ?12/19/21 0732 128/67 98 ?F (36.7 ?C) Oral 84 18 99 % -- --  ?12/19/21 0615 (!) 117/56 97.7 ?F (36.5 ?C) Oral 78 18 100 % -- --  ?12/19/21 0515 122/78 -- -- 78 18 98 % -- --  ?12/19/21  0415 124/75 -- -- 79 18 98 % -- --  ?12/19/21 0300 131/83 -- -- 79 18 96 % -- --  ?12/19/21 0215 121/82 -- -- 82 18 97 % -- --  ?12/19/21 0130 125/73 -- -- 86 18 97 % -- --  ?12/19/21 0100 124/84 -- -- 78 17 100 % -- --  ? ?No intake or output data in the 24 hours ending 12/19/21 0850 ? ?Physical Exam: ? ? ?General: this is a well-appearing patient in no acute distress.  She has an emesis bag at the bedside.  She has normal vocal quality and is breathing comfortably on room air. ?Eyes: sclera anicteric, no redness ?ENT: oral mucosa moist without lesions, no cervical or supraclavicular lymphadenopathy, good dentition.  No crepitus. ?CV: RRR without murmur, S1/S2, no JVD,, no peripheral edema ?Resp: clear to auscultation bilaterally, normal RR and effort noted ?GI: soft, no tenderness, with active bowel sounds. No guarding or palpable organomegaly noted ?Skin; warm and dry, no rash or jaundice noted ?Neuro: awake, alert and oriented x 3. Normal gross motor function and fluent speech. ? ?Labs: ? ? ?  Latest Ref Rng & Units 12/18/2021  ?  9:37 PM 01/16/2019  ?  8:55 AM 01/17/2018  ?  8:36 AM  ?CBC  ?WBC 4.0 - 10.5 K/uL 9.4   6.8   6.7    ?Hemoglobin 12.0 - 15.0 g/dL 13.6   13.7   14.6    ?Hematocrit 36.0 - 46.0 % 40.7   42.2   42.7    ?Platelets 150 - 400 K/uL 204   230   200    ? ? ? ?  Latest Ref Rng & Units 12/18/2021  ?  9:37 PM 01/16/2019  ?  8:55 AM 01/17/2018  ?  8:36 AM  ?CMP  ?Glucose 70 - 99 mg/dL 368   157   182    ?BUN 6 - 20 mg/dL 19   15   14    ?Creatinine 0.44 - 1.00 mg/dL 0.78   0.74   0.90    ?Sodium 135 - 145 mmol/L 135   138   141    ?Potassium 3.5 - 5.1 mmol/L 3.8   4.0   3.9    ?Chloride 98 - 111 mmol/L 101   104   104    ?CO2 22 - 32 mmol/L 19   23   27    ?Calcium 8.9 - 10.3 mg/dL 9.8   9.4   9.7    ?Total Protein 6.5 - 8.1 g/dL  7.4   7.6    ?Total Bilirubin 0.3 - 1.2 mg/dL  0.4   0.6    ?Alkaline Phos 38 - 126 U/L  65   66    ?  AST 15 - 41 U/L  20   32    ?ALT 0 - 44 U/L  30   40    ? ? ?No results for  input(s): INR in the last 168 hours. ?_________________________________________________________ ?Radiologic studies: ? ? ?______________________________________________________ ?Other studies: ? ? ?_______________________________________________________ ?Assessment & Plan  ?Impression: ? ?Esophageal food bolus impaction ?Hyperglycemia in patient with type 2 diabetes -this is likely chronic, and Willisha says she has between primary care providers at this point. ? ?Plan: ? ?Upper endoscopy under general anesthesia today.  She was agreeable after discussion of the procedure and risks. ? The benefits and risks of the planned procedure were described in detail with the patient or (when appropriate) their health care proxy.  Risks were outlined as including, but not limited to, bleeding, infection, perforation, adverse medication reaction leading to cardiac or pulmonary decompensation, pancreatitis (if ERCP).  The limitation of incomplete mucosal visualization was also discussed.  No guarantees or warranties were given. ? ?I discussed the hyperglycemia management with Dr. Howze of anesthesia, and they will administer 10 units of subcutaneous regular insulin by their established protocol.  She will then need to be monitored for 2 hours in the postop area prior to discharge. ?This patient then needs to immediately reestablish primary care for management of her poorly controlled diabetes. ? ?I anticipate she will likely be able to go home today after she recovers from anesthesia. ? ? ?Thank you for the courtesy of this consult.  Please contact me with any questions or concerns. ? ?Bernedette Auston L Danis III ?Office: [phone] ? ?

## 2021-12-20 LAB — SURGICAL PATHOLOGY

## 2021-12-21 ENCOUNTER — Encounter (HOSPITAL_COMMUNITY): Payer: Self-pay | Admitting: Gastroenterology

## 2021-12-22 ENCOUNTER — Other Ambulatory Visit: Payer: Self-pay

## 2021-12-22 DIAGNOSIS — R1310 Dysphagia, unspecified: Secondary | ICD-10-CM

## 2022-01-12 ENCOUNTER — Other Ambulatory Visit (HOSPITAL_COMMUNITY): Payer: 59

## 2022-01-17 ENCOUNTER — Ambulatory Visit: Payer: 59 | Admitting: Internal Medicine

## 2022-01-26 ENCOUNTER — Encounter: Payer: Self-pay | Admitting: Nurse Practitioner

## 2022-01-26 ENCOUNTER — Ambulatory Visit (INDEPENDENT_AMBULATORY_CARE_PROVIDER_SITE_OTHER): Payer: 59 | Admitting: Nurse Practitioner

## 2022-01-26 VITALS — BP 126/74 | HR 86 | Temp 97.7°F | Ht 68.0 in | Wt 224.6 lb

## 2022-01-26 DIAGNOSIS — E785 Hyperlipidemia, unspecified: Secondary | ICD-10-CM | POA: Insufficient documentation

## 2022-01-26 DIAGNOSIS — E538 Deficiency of other specified B group vitamins: Secondary | ICD-10-CM

## 2022-01-26 DIAGNOSIS — E039 Hypothyroidism, unspecified: Secondary | ICD-10-CM

## 2022-01-26 DIAGNOSIS — E1165 Type 2 diabetes mellitus with hyperglycemia: Secondary | ICD-10-CM

## 2022-01-26 DIAGNOSIS — I10 Essential (primary) hypertension: Secondary | ICD-10-CM

## 2022-01-26 NOTE — Assessment & Plan Note (Signed)
Chronic, will check serum vitamin b12 level. Further recommendations may be made based on these results.

## 2022-01-26 NOTE — Assessment & Plan Note (Signed)
Chronic, per chart review may be uncontrolled. Will check A1c, Further recommendations may be made based on these results.

## 2022-01-26 NOTE — Progress Notes (Signed)
Established Patient Office Visit  Subjective   Patient ID: [name], female    DOB: 1966/03/06  Age: 56 y.o. MRN: [id number]  Chief Complaint  Patient presents with   New Patient (Initial Visit)    Patient arrives today to establish care.  She reports that her previous PCP's office no longer takes her insurance so she is changing to this office.  She has no acute concerns today, but does have type 2 diabetes, hyperlipidemia, vitamin B12 deficiency, hypothyroidism, and hypertension.  Per chart review last blood work was collected in September 2022 which showed an A1c of greater than 12.  She is currently on metformin and Actos.  She is also on statin and fenofibrate as well as ACE inhibitor.  She reports that she is scheduled to have her diabetic eye exam completed in approximately 2 months.  She takes vitamin B12 injections every 6 months.  She continues on levothyroxine 75 mcg daily.      Review of Systems  Respiratory:  Negative for shortness of breath.   Cardiovascular:  Negative for chest pain.  Gastrointestinal:  Negative for blood in stool.  Neurological:  Negative for dizziness and headaches.  Psychiatric/Behavioral:  Negative for depression and suicidal ideas. The patient is nervous/anxious.       Objective:     BP 126/74 (BP Location: Left Arm, Patient Position: Sitting, Cuff Size: Large)   Pulse 86   Temp 97.7 F (36.5 C) (Oral)   Ht '5\' 8"'$  (1.727 m)   Wt 224 lb 9.6 oz (101.9 kg)   SpO2 98%   BMI 34.15 kg/m  BP Readings from Last 3 Encounters:  01/26/22 126/74  12/19/21 (!) 104/54  10/20/21 105/67   Wt Readings from Last 3 Encounters:  01/26/22 224 lb 9.6 oz (101.9 kg)  12/19/21 227 lb 15.3 oz (103.4 kg)  10/20/21 227 lb (103 kg)      Physical Exam Vitals reviewed.  Constitutional:      General: She is not in acute distress.    Appearance: Normal appearance.  HENT:     Head: Normocephalic and atraumatic.  Neck:     Vascular: No  carotid bruit.  Cardiovascular:     Rate and Rhythm: Normal rate and regular rhythm.     Pulses: Normal pulses.     Heart sounds: Normal heart sounds.  Pulmonary:     Effort: Pulmonary effort is normal.     Breath sounds: Normal breath sounds.  Skin:    General: Skin is warm and dry.  Neurological:     General: No focal deficit present.     Mental Status: She is alert and oriented to person, place, and time.  Psychiatric:        Mood and Affect: Mood normal.        Behavior: Behavior normal.        Judgment: Judgment normal.      No results found for any visits on 01/26/22.    The ASCVD Risk score (Arnett DK, et al., 2019) failed to calculate for the following reasons:   Cannot find a previous HDL lab    Assessment & Plan:   Problem List Items Addressed This Visit       Cardiovascular and Mediastinum   Hypertension    Chronic, stable. Patient to continue on lisinopril-hctz (10-12.'5mg'$ /day)        Endocrine   Hypothyroidism - Primary    Chronic, check TSH. Further recommendations may be made based on these  results.        Relevant Orders   TSH   Hemoglobin A1c   Lipid panel   Comprehensive metabolic panel   CBC   Microalbumin / creatinine urine ratio   B12   Type 2 diabetes mellitus with hyperglycemia, without long-term current use of insulin (HCC)    Chronic, per chart review may be uncontrolled. Will check A1c, Further recommendations may be made based on these results.        Relevant Orders   TSH   Hemoglobin A1c   Lipid panel   Comprehensive metabolic panel   CBC   Microalbumin / creatinine urine ratio   B12     Other   Hyperlipidemia    Chronic, will check fasting lipid panel next week. Continue lipitor '10mg'$ /day and fenofibrate '54mg'$ /day.      Relevant Orders   TSH   Hemoglobin A1c   Lipid panel   Comprehensive metabolic panel   CBC   Microalbumin / creatinine urine ratio   B12   Vitamin B 12 deficiency    Chronic, will check serum  vitamin b12 level. Further recommendations may be made based on these results.        Relevant Orders   B12    Return in about 1 month (around 02/25/2022) for follow-up Atley [place].    Ailene Ards, NP

## 2022-01-26 NOTE — Assessment & Plan Note (Signed)
Chronic, will check fasting lipid panel next week. Continue lipitor '10mg'$ /day and fenofibrate '54mg'$ /day.

## 2022-01-26 NOTE — Assessment & Plan Note (Addendum)
Chronic, stable. Patient to continue on lisinopril-hctz (10-12.'5mg'$ /day)

## 2022-01-26 NOTE — Assessment & Plan Note (Signed)
Chronic, check TSH. Further recommendations may be made based on these results.

## 2022-01-29 LAB — COMPREHENSIVE METABOLIC PANEL
ALT: 20 U/L (ref 0–35)
AST: 14 U/L (ref 0–37)
Albumin: 4.4 g/dL (ref 3.5–5.2)
Alkaline Phosphatase: 50 U/L (ref 39–117)
BUN: 17 mg/dL (ref 6–23)
CO2: 25 mEq/L (ref 19–32)
Calcium: 9.7 mg/dL (ref 8.4–10.5)
Chloride: 100 mEq/L (ref 96–112)
Creatinine, Ser: 0.79 mg/dL (ref 0.40–1.20)
GFR: 83.34 mL/min (ref >60.00)
Glucose, Bld: 329 mg/dL — ABNORMAL HIGH (ref 70–99)
Potassium: 4 mEq/L (ref 3.5–5.1)
Sodium: 135 mEq/L (ref 135–145)
Total Bilirubin: 0.4 mg/dL (ref 0.2–1.2)
Total Protein: 7.1 g/dL (ref 6.0–8.3)

## 2022-01-29 LAB — LIPID PANEL
Cholesterol: 135 mg/dL (ref 0–200)
HDL: 33.6 mg/dL — ABNORMAL LOW (ref >39.00)
LDL Cholesterol: 71 mg/dL (ref 0–99)
NonHDL: 101.03
Total CHOL/HDL Ratio: 4
Triglycerides: 148 mg/dL (ref 0.0–149.0)
VLDL: 29.6 mg/dL (ref 0.0–40.0)

## 2022-01-29 LAB — HEMOGLOBIN A1C: Hgb A1c MFr Bld: 12.6 % — ABNORMAL HIGH (ref 4.6–6.5)

## 2022-01-29 LAB — CBC
HCT: 40.8 % (ref 36.0–46.0)
Hemoglobin: 13.5 g/dL (ref 12.0–15.0)
MCHC: 33.1 g/dL (ref 30.0–36.0)
MCV: 79.1 fl (ref 78.0–100.0)
Platelets: 208 10*3/uL (ref 150.0–400.0)
RBC: 5.16 Mil/uL — ABNORMAL HIGH (ref 3.87–5.11)
RDW: 14.3 % (ref 11.5–15.5)
WBC: 6.9 10*3/uL (ref 4.0–10.5)

## 2022-01-29 LAB — MICROALBUMIN / CREATININE URINE RATIO
Creatinine,U: 108.6 mg/dL
Microalb Creat Ratio: 0.7 mg/g (ref 0.0–30.0)
Microalb, Ur: 0.8 mg/dL (ref 0.0–1.9)

## 2022-01-29 LAB — TSH: TSH: 2.74 u[IU]/mL (ref 0.35–5.50)

## 2022-01-29 LAB — VITAMIN B12: Vitamin B-12: 194 pg/mL — ABNORMAL LOW (ref 211–911)

## 2022-02-02 ENCOUNTER — Ambulatory Visit (INDEPENDENT_AMBULATORY_CARE_PROVIDER_SITE_OTHER): Payer: 59 | Admitting: Nurse Practitioner

## 2022-02-02 VITALS — BP 130/86 | HR 76 | Temp 97.8°F | Ht 68.0 in | Wt 221.0 lb

## 2022-02-02 DIAGNOSIS — E1165 Type 2 diabetes mellitus with hyperglycemia: Secondary | ICD-10-CM | POA: Diagnosis not present

## 2022-02-02 DIAGNOSIS — E538 Deficiency of other specified B group vitamins: Secondary | ICD-10-CM

## 2022-02-02 MED ORDER — CYANOCOBALAMIN 1000 MCG/ML IJ SOLN
1000.0000 ug | Freq: Once | INTRAMUSCULAR | Status: AC
  Administered 2022-02-02: 1000 ug via INTRAMUSCULAR

## 2022-02-02 MED ORDER — BLOOD GLUCOSE MONITOR KIT: PACK | 0 refills | Status: DC

## 2022-02-02 MED ORDER — OZEMPIC (0.25 OR 0.5 MG/DOSE) 2 MG/3ML ~~LOC~~ SOPN: 0.2500 mg | PEN_INJECTOR | SUBCUTANEOUS | 1 refills | Status: DC

## 2022-02-02 NOTE — Assessment & Plan Note (Addendum)
Chronic, current A1C =  12.6, Goal is less than 7. Patient currently takes Metformin and Actos and although asymptomatic is uncontrolled and needs additional Community education officer. Will order Ozempic for patient and glucometer to monitor fasting blood sugar. Would like her to write them down until she comes back for her follow-up. Educated on lifestyle changes carb modified diet and exercise. FU in 5 weeks

## 2022-02-02 NOTE — Assessment & Plan Note (Addendum)
Chronic, serum B12 level below range at last visit = 194. Patient given B12 injection today. FU in 5 weeks to recheck

## 2022-02-06 ENCOUNTER — Encounter: Payer: Self-pay | Admitting: Nurse Practitioner

## 2022-02-15 ENCOUNTER — Other Ambulatory Visit: Payer: Self-pay | Admitting: Nurse Practitioner

## 2022-02-15 DIAGNOSIS — E1165 Type 2 diabetes mellitus with hyperglycemia: Secondary | ICD-10-CM

## 2022-02-15 MED ORDER — ONETOUCH DELICA LANCETS 33G MISC: 1.0000 | Freq: Every day | 12 refills | Status: AC

## 2022-02-15 MED ORDER — ONETOUCH ULTRA 2 W/DEVICE KIT: 1.0000 | PACK | Freq: Every day | 0 refills | Status: AC

## 2022-02-15 MED ORDER — ONETOUCH ULTRA VI STRP: ORAL_STRIP | 12 refills | Status: AC

## 2022-02-15 NOTE — Telephone Encounter (Signed)
Spoke with her pharmacy and her insurance will cover the following:  -One touch ultra 2 ( glucometer) -One touch ultra test strips -One touch delica plus lancets 33 gauge  These will require 3 separate prescriptions

## 2022-02-15 NOTE — Telephone Encounter (Signed)
Patient states she has not received her glucose meter. I asked for the brand and she said that she has never used one and that you were to order her one. Let me know what I can do?

## 2022-02-16 NOTE — Telephone Encounter (Signed)
PA has been started...  KEY: [id number]

## 2022-02-20 NOTE — Telephone Encounter (Signed)
Request for coverage has been approved as of 02/16/22 through 02/17/23. Patient notified.

## 2022-03-01 ENCOUNTER — Ambulatory Visit: Payer: 59 | Admitting: Nurse Practitioner

## 2022-03-16 ENCOUNTER — Ambulatory Visit: Payer: 59 | Admitting: Nurse Practitioner

## 2022-04-11 ENCOUNTER — Encounter: Payer: Self-pay | Admitting: Nurse Practitioner

## 2022-04-11 ENCOUNTER — Ambulatory Visit (INDEPENDENT_AMBULATORY_CARE_PROVIDER_SITE_OTHER): Payer: 59 | Admitting: Nurse Practitioner

## 2022-04-11 VITALS — BP 126/88 | HR 78 | Temp 97.7°F | Wt 221.4 lb

## 2022-04-11 DIAGNOSIS — E1165 Type 2 diabetes mellitus with hyperglycemia: Secondary | ICD-10-CM

## 2022-04-11 DIAGNOSIS — E538 Deficiency of other specified B group vitamins: Secondary | ICD-10-CM | POA: Diagnosis not present

## 2022-04-11 MED ORDER — OZEMPIC (0.25 OR 0.5 MG/DOSE) 2 MG/3ML ~~LOC~~ SOPN: 0.5000 mg | PEN_INJECTOR | SUBCUTANEOUS | 1 refills | Status: DC

## 2022-04-11 MED ORDER — CYANOCOBALAMIN 1000 MCG/ML IJ SOLN: 1000.0000 ug | Freq: Once | INTRAMUSCULAR | Status: AC

## 2022-04-11 NOTE — Assessment & Plan Note (Signed)
Vitamin B12 1000 mcg IM injection administered today, consider checking serum level at next office visit.

## 2022-04-11 NOTE — Assessment & Plan Note (Addendum)
Chronic, patient tolerating Ozempic.  Will increase dose to 0.5 mg injection weekly, patient also continue on her metformin and Actos.  We will plan on checking A1c at next office visit.  She also continue on ACE inhibitor, statin, and fenofibrate.  Diabetic foot exam completed today as well,  Exam within normal limits.

## 2022-04-11 NOTE — Progress Notes (Signed)
Established Patient Office Visit  Subjective   Patient ID: [name], female    DOB: 09/04/1965  Age: 56 y.o. MRN: [id number]  Chief Complaint  Patient presents with   Follow-up   Diabetes   Type 2 Diabetes: started ozempic 0.'25mg'$ /week about 6 weeks ago. Tolerating well. Has at home- CBGS show they have come down from 300s-190/200s. No hypoglycemia. Continues on statin and ACEI. Microalbumin/crt ration <30.  Had diabetic eye exam completed last week.  Vitamin B12 Deficiency: last serum level showed B12 <200, due for IM injection today.     Review of Systems  Constitutional:  Negative for malaise/fatigue.  Respiratory:  Negative for shortness of breath.   Cardiovascular:  Negative for chest pain.  Gastrointestinal:  Negative for abdominal pain and blood in stool.      Objective:     BP 126/88 (BP Location: Right Arm, Patient Position: Sitting, Cuff Size: Large)   Pulse 78   Temp 97.7 F (36.5 C) (Oral)   Wt 221 lb 6 oz (100.4 kg)   SpO2 99%   BMI 33.66 kg/m  BP Readings from Last 3 Encounters:  04/11/22 126/88  02/02/22 130/86  01/26/22 126/74   Wt Readings from Last 3 Encounters:  04/11/22 221 lb 6 oz (100.4 kg)  02/02/22 221 lb (100.2 kg)  01/26/22 224 lb 9.6 oz (101.9 kg)      Physical Exam Vitals reviewed.  Constitutional:      General: She is not in acute distress.    Appearance: Normal appearance.  HENT:     Head: Normocephalic and atraumatic.  Neck:     Vascular: No carotid bruit.  Cardiovascular:     Rate and Rhythm: Normal rate and regular rhythm.     Pulses: Normal pulses.          Dorsalis pedis pulses are 2+ on the right side and 2+ on the left side.     Heart sounds: Normal heart sounds.  Pulmonary:     Effort: Pulmonary effort is normal.     Breath sounds: Normal breath sounds.  Musculoskeletal:     Right foot: No deformity.     Left foot: No deformity.  Feet:     Right foot:     Protective Sensation: 10 sites tested.   10 sites sensed.     Skin integrity: Skin integrity normal.     Toenail Condition: Right toenails are normal.     Left foot:     Protective Sensation: 10 sites tested.  10 sites sensed.     Skin integrity: Skin integrity normal.     Toenail Condition: Left toenails are normal.  Skin:    General: Skin is warm and dry.  Neurological:     General: No focal deficit present.     Mental Status: She is alert and oriented to person, place, and time.  Psychiatric:        Mood and Affect: Mood normal.        Behavior: Behavior normal.        Judgment: Judgment normal.      No results found for any visits on 04/11/22.    The 10-year ASCVD risk score (Arnett DK, et al., 2019) is: 6.3%    Assessment & Plan:   Problem List Items Addressed This Visit       Endocrine   Type 2 diabetes mellitus with hyperglycemia, without long-term current use of insulin ([place]) - Primary    Chronic, patient tolerating Ozempic.  Will increase dose to 0.5 mg injection weekly, patient also continue on her metformin and Actos.  We will plan on checking A1c at next office visit.  She also continue on ACE inhibitor, statin, and fenofibrate.  Diabetic foot exam completed today as well,  Exam within normal limits.      Relevant Medications   Semaglutide,0.25 or 0.'5MG'$ /DOS, (OZEMPIC, 0.25 OR 0.5 MG/DOSE,) 2 MG/3ML SOPN     Other   Vitamin B12 deficiency    Vitamin B12 1000 mcg IM injection administered today, consider checking serum level at next office visit.      Relevant Medications   cyanocobalamin (VITAMIN B12) injection 1,000 mcg    Return in about 1 month (around 05/12/2022) for F/U with Judson Roch.    Ailene Ards, NP

## 2022-04-13 ENCOUNTER — Encounter: Payer: Self-pay | Admitting: Family

## 2022-04-15 ENCOUNTER — Encounter: Payer: Self-pay | Admitting: Nurse Practitioner

## 2022-04-15 ENCOUNTER — Other Ambulatory Visit: Payer: Self-pay | Admitting: Nurse Practitioner

## 2022-04-17 ENCOUNTER — Encounter: Payer: Self-pay | Admitting: Nurse Practitioner

## 2022-04-17 MED ORDER — PIOGLITAZONE HCL 15 MG PO TABS: 15.0000 mg | ORAL_TABLET | Freq: Every day | ORAL | 1 refills | Status: DC

## 2022-04-17 MED ORDER — METFORMIN HCL 500 MG PO TABS
1000.0000 mg | ORAL_TABLET | Freq: Two times a day (BID) | ORAL | 1 refills | Status: DC
Start: 2022-04-17 — End: 2022-07-24

## 2022-04-17 NOTE — Telephone Encounter (Signed)
Noted../lmb 

## 2022-04-17 NOTE — Telephone Encounter (Signed)
Pharmacy already in chart.Marland KitchenJohny [name]

## 2022-04-18 ENCOUNTER — Other Ambulatory Visit: Payer: Self-pay | Admitting: Nurse Practitioner

## 2022-04-18 DIAGNOSIS — Z1231 Encounter for screening mammogram for malignant neoplasm of breast: Secondary | ICD-10-CM

## 2022-05-05 ENCOUNTER — Encounter: Payer: Self-pay | Admitting: Nurse Practitioner

## 2022-05-08 ENCOUNTER — Telehealth: Payer: Self-pay | Admitting: Nurse Practitioner

## 2022-05-08 ENCOUNTER — Encounter: Payer: Self-pay | Admitting: Family

## 2022-05-08 NOTE — Telephone Encounter (Signed)
Patient needs to get her ozempic refilled and pharmacy told patient that they would need a prior authorization since her insurance has changed.

## 2022-05-09 ENCOUNTER — Encounter: Payer: Self-pay | Admitting: Nurse Practitioner

## 2022-05-10 ENCOUNTER — Ambulatory Visit: Payer: 59 | Admitting: Nurse Practitioner

## 2022-05-10 NOTE — Telephone Encounter (Signed)
Pt PA for Ozempic started and send to plan Key: [id number])

## 2022-05-14 ENCOUNTER — Ambulatory Visit
Admission: RE | Admit: 2022-05-14 | Discharge: 2022-05-14 | Disposition: A | Discharge status: Home / Self Care | Payer: 59 | Source: Ambulatory Visit | Attending: Nurse Practitioner | Admitting: Nurse Practitioner

## 2022-05-14 DIAGNOSIS — Z1231 Encounter for screening mammogram for malignant neoplasm of breast: Secondary | ICD-10-CM

## 2022-05-14 NOTE — Telephone Encounter (Signed)
Pt was calling checking on the status of the PA for Ozempic.  Pt return her call or update via Mychart  Please advise

## 2022-05-17 ENCOUNTER — Other Ambulatory Visit: Payer: Self-pay | Admitting: Nurse Practitioner

## 2022-05-17 DIAGNOSIS — E1165 Type 2 diabetes mellitus with hyperglycemia: Secondary | ICD-10-CM

## 2022-05-17 MED ORDER — TRULICITY 0.75 MG/0.5ML ~~LOC~~ SOAJ: 0.7500 mg | SUBCUTANEOUS | 1 refills | Status: DC

## 2022-05-21 ENCOUNTER — Other Ambulatory Visit: Payer: Self-pay

## 2022-05-21 ENCOUNTER — Encounter: Payer: Self-pay | Admitting: Nurse Practitioner

## 2022-05-21 DIAGNOSIS — F329 Major depressive disorder, single episode, unspecified: Secondary | ICD-10-CM

## 2022-05-21 DIAGNOSIS — E781 Pure hyperglyceridemia: Secondary | ICD-10-CM

## 2022-05-21 DIAGNOSIS — I10 Essential (primary) hypertension: Secondary | ICD-10-CM

## 2022-05-21 MED ORDER — LISINOPRIL-HYDROCHLOROTHIAZIDE 10-12.5 MG PO TABS: 1.0000 | ORAL_TABLET | Freq: Every day | ORAL | 1 refills | Status: DC

## 2022-05-21 MED ORDER — ATORVASTATIN CALCIUM 10 MG PO TABS: 10.0000 mg | ORAL_TABLET | Freq: Every day | ORAL | 1 refills | Status: DC

## 2022-05-21 MED ORDER — SERTRALINE HCL 50 MG PO TABS: ORAL_TABLET | ORAL | 1 refills | Status: DC

## 2022-05-21 MED ORDER — FENOFIBRATE 54 MG PO TABS: ORAL_TABLET | ORAL | 1 refills | Status: DC

## 2022-05-23 ENCOUNTER — Encounter: Payer: Self-pay | Admitting: Nurse Practitioner

## 2022-05-24 ENCOUNTER — Ambulatory Visit: Payer: 59 | Admitting: Nurse Practitioner

## 2022-05-24 ENCOUNTER — Telehealth: Payer: Self-pay

## 2022-05-24 NOTE — Telephone Encounter (Signed)
Called pt and made aware that we have not receive the paper work as of yet and provided her with our fax number, to give to her insurance since pt is planning on calling them.

## 2022-05-24 NOTE — Telephone Encounter (Signed)
error 

## 2022-05-24 NOTE — Telephone Encounter (Signed)
Patient is calling in asking if we have received this or not.

## 2022-05-26 ENCOUNTER — Encounter: Payer: Self-pay | Admitting: Nurse Practitioner

## 2022-06-03 ENCOUNTER — Encounter: Payer: Self-pay | Admitting: Nurse Practitioner

## 2022-06-06 NOTE — Telephone Encounter (Signed)
Patient is calling in asking for an update on forms, if we have not received them patient asked for [place] to reach out to the company at [phone]. If we have received them they needed to be faxed to [phone].

## 2022-06-11 NOTE — Telephone Encounter (Signed)
Called pt to let them know that PA is getting done and having issues with insurance not being able to register her. Called insurance was able to get it through  PA key: [id number] Waiting for insurance approval

## 2022-06-14 NOTE — Telephone Encounter (Signed)
Pt was denied Trulicity, called insurance they need more information such as pt office visit notes and letter stating the reason why pt need to be on. Letter in progress and office note printed and will be faxed

## 2022-06-15 ENCOUNTER — Encounter: Payer: Self-pay | Admitting: Nurse Practitioner

## 2022-06-15 ENCOUNTER — Ambulatory Visit: Payer: 59 | Admitting: Nurse Practitioner

## 2022-06-15 NOTE — Telephone Encounter (Signed)
Letter and office note is faxed  Waiting on insurance

## 2022-06-20 NOTE — Telephone Encounter (Signed)
Pt medication Trulicity has been approved  from 06/16/22 to 06/17/23

## 2022-07-02 ENCOUNTER — Telehealth: Payer: Self-pay | Admitting: Nurse Practitioner

## 2022-07-02 NOTE — Telephone Encounter (Signed)
For our records:  Pt has dropped of forms to be signed by [name] and the forms are in [place] box.  Upon completion please fax to: [phone]  Pt was informed that [name] will not be in this week due to the holiday, and PW will be signed next week.

## 2022-07-09 ENCOUNTER — Encounter: Payer: Self-pay | Admitting: Nurse Practitioner

## 2022-07-11 NOTE — Telephone Encounter (Signed)
Picked up paper work, once provider signs, will faxed it

## 2022-07-20 ENCOUNTER — Other Ambulatory Visit: Payer: Self-pay | Admitting: Nurse Practitioner

## 2022-07-20 ENCOUNTER — Ambulatory Visit: Payer: 59 | Admitting: Nurse Practitioner

## 2022-07-20 VITALS — BP 122/78 | HR 90 | Temp 97.5°F | Ht 68.0 in | Wt 218.4 lb

## 2022-07-20 DIAGNOSIS — E538 Deficiency of other specified B group vitamins: Secondary | ICD-10-CM | POA: Diagnosis not present

## 2022-07-20 DIAGNOSIS — E785 Hyperlipidemia, unspecified: Secondary | ICD-10-CM | POA: Diagnosis not present

## 2022-07-20 DIAGNOSIS — E1165 Type 2 diabetes mellitus with hyperglycemia: Secondary | ICD-10-CM | POA: Diagnosis not present

## 2022-07-20 LAB — HEMOGLOBIN A1C: Hgb A1c MFr Bld: 8.6 % — ABNORMAL HIGH (ref 4.6–6.5)

## 2022-07-20 LAB — BASIC METABOLIC PANEL
BUN: 15 mg/dL (ref 6–23)
CO2: 26 mEq/L (ref 19–32)
Calcium: 10.4 mg/dL (ref 8.4–10.5)
Chloride: 103 mEq/L (ref 96–112)
Creatinine, Ser: 0.69 mg/dL (ref 0.40–1.20)
GFR: 96.37 mL/min (ref >60.00)
Glucose, Bld: 156 mg/dL — ABNORMAL HIGH (ref 70–99)
Potassium: 4.1 mEq/L (ref 3.5–5.1)
Sodium: 137 mEq/L (ref 135–145)

## 2022-07-20 LAB — LIPID PANEL
Cholesterol: 120 mg/dL (ref 0–200)
HDL: 39.7 mg/dL (ref >39.00)
LDL Cholesterol: 54 mg/dL (ref 0–99)
NonHDL: 80.71
Total CHOL/HDL Ratio: 3
Triglycerides: 135 mg/dL (ref 0.0–149.0)
VLDL: 27 mg/dL (ref 0.0–40.0)

## 2022-07-20 LAB — VITAMIN B12: Vitamin B-12: 162 pg/mL — ABNORMAL LOW (ref 211–911)

## 2022-07-20 MED ORDER — TRULICITY 1.5 MG/0.5ML ~~LOC~~ SOAJ: 1.5000 mg | SUBCUTANEOUS | 1 refills | Status: DC

## 2022-07-20 NOTE — Progress Notes (Signed)
Established Patient Office Visit  Subjective   Patient ID: [name], female    DOB: [birth year]  Age: 56 y.o. MRN: [id number]  Chief Complaint  Patient presents with   Diabetes    Type 2 diabetes/hyperlipidemia: Type 2 diabetes diagnosed in 2022, currently on Trulicity 3.81 mg weekly injection, metformin 1000 mg twice a day, and Actos 15 mg daily.  Brings in list of fasting blood sugars, generally they are around 150s to 180s.  Tolerating medication well.  On ACE inhibitor as well as statin therapy.  Last LDL 71, patient is fasting today.  Currently on atorvastatin 10 mg daily and fenofibrate 54 mg daily.  Reports last diabetic eye exam was completed in August.  Vitamin B12 deficiency: Diagnosed in 2000, reports having been severely anemic requiring hospitalization.  Has been treated with injectable vitamin B12 every 6 months.  Last injection administered approximately 4 months ago.  Due to have serum level checked today.    Review of Systems  Respiratory:  Negative for shortness of breath.   Cardiovascular:  Negative for chest pain.      Objective:     BP 122/78   Pulse 90   Temp (!) 97.5 F (36.4 C) (Oral)   Ht '5\' 8"'$  (1.727 m)   Wt 218 lb 6 oz (99.1 kg)   SpO2 97%   BMI 33.20 kg/m  BP Readings from Last 3 Encounters:  07/20/22 122/78  04/11/22 126/88  02/02/22 130/86   Wt Readings from Last 3 Encounters:  07/20/22 218 lb 6 oz (99.1 kg)  04/11/22 221 lb 6 oz (100.4 kg)  02/02/22 221 lb (100.2 kg)      Physical Exam Vitals reviewed.  Constitutional:      General: She is not in acute distress.    Appearance: Normal appearance.  HENT:     Head: Normocephalic and atraumatic.  Neck:     Thyroid: No thyroid mass or thyromegaly.     Vascular: No carotid bruit.  Cardiovascular:     Rate and Rhythm: Normal rate and regular rhythm.     Pulses: Normal pulses.     Heart sounds: Normal heart sounds.  Pulmonary:     Effort: Pulmonary effort is normal.      Breath sounds: Normal breath sounds.  Skin:    General: Skin is warm and dry.  Neurological:     General: No focal deficit present.     Mental Status: She is alert and oriented to person, place, and time.  Psychiatric:        Mood and Affect: Mood normal.        Behavior: Behavior normal.        Judgment: Judgment normal.      No results found for any visits on 07/20/22.    The 10-year ASCVD risk score (Arnett DK, et al., 2019) is: 5.9%    Assessment & Plan:   Problem List Items Addressed This Visit       Endocrine   Type 2 diabetes mellitus with hyperglycemia, without long-term current use of insulin (HCC) - Primary    Chronic, check A1c today.  Will increase Trulicity to 1.5 mg/week, continue metformin 1000 mg twice a day and Actos 15 mg daily.  Continue lisinopril and atorvastatin.  Check fasting lipid panel today as well.  Further recommendations may be made based upon these results.  Follow-up in 3 months.      Relevant Medications   Dulaglutide (TRULICITY) 1.5 WE/9.9BZ SOPN  Other Relevant Orders   Lipid panel   Hemoglobin C3I   Basic metabolic panel     Other   Hyperlipidemia    Chronic, continue on Lipitor 10 mg daily and fenofibrate 54 milligrams daily.      Relevant Orders   Lipid panel   Vitamin B12 deficiency    Chronic, check serum level today, consider administration of vitamin B12 injection pending level.      Relevant Orders   Vitamin B12    Return in about 3 months (around 10/19/2022) for for f/u with Nicholle Falzon anytime after 10/18/21.    Ailene Ards, NP

## 2022-07-20 NOTE — Assessment & Plan Note (Signed)
Chronic, check serum level today, consider administration of vitamin B12 injection pending level.

## 2022-07-20 NOTE — Assessment & Plan Note (Signed)
Chronic, check A1c today.  Will increase Trulicity to 1.5 mg/week, continue metformin 1000 mg twice a day and Actos 15 mg daily.  Continue lisinopril and atorvastatin.  Check fasting lipid panel today as well.  Further recommendations may be made based upon these results.  Follow-up in 3 months.

## 2022-07-20 NOTE — Assessment & Plan Note (Signed)
Chronic, continue on Lipitor 10 mg daily and fenofibrate 54 milligrams daily.

## 2022-07-23 ENCOUNTER — Encounter: Payer: Self-pay | Admitting: Nurse Practitioner

## 2022-07-24 ENCOUNTER — Other Ambulatory Visit: Payer: Self-pay | Admitting: Radiology

## 2022-07-24 DIAGNOSIS — E034 Atrophy of thyroid (acquired): Secondary | ICD-10-CM

## 2022-07-24 MED ORDER — LEVOTHYROXINE SODIUM 75 MCG PO TABS: ORAL_TABLET | ORAL | 3 refills | Status: DC

## 2022-07-24 MED ORDER — PIOGLITAZONE HCL 15 MG PO TABS: 15.0000 mg | ORAL_TABLET | Freq: Every day | ORAL | 1 refills | Status: DC

## 2022-07-24 MED ORDER — METFORMIN HCL 500 MG PO TABS: 1000.0000 mg | ORAL_TABLET | Freq: Two times a day (BID) | ORAL | 1 refills | Status: DC

## 2022-08-01 ENCOUNTER — Ambulatory Visit (INDEPENDENT_AMBULATORY_CARE_PROVIDER_SITE_OTHER): Payer: 59

## 2022-08-01 DIAGNOSIS — E538 Deficiency of other specified B group vitamins: Secondary | ICD-10-CM | POA: Diagnosis not present

## 2022-08-01 MED ORDER — CYANOCOBALAMIN 1000 MCG/ML IJ SOLN
1000.0000 ug | Freq: Once | INTRAMUSCULAR | Status: AC
  Administered 2022-08-01: 1000 ug via INTRAMUSCULAR

## 2022-08-01 NOTE — Progress Notes (Signed)
After obtaining consent, and per orders of [name], NP, injection of B12 was given in the left deltoid by [name]. Patient instructed to report any adverse reaction to the office immediately.

## 2022-08-16 ENCOUNTER — Encounter: Payer: Self-pay | Admitting: Nurse Practitioner

## 2022-08-23 ENCOUNTER — Encounter: Payer: Self-pay | Admitting: Nurse Practitioner

## 2022-08-23 ENCOUNTER — Other Ambulatory Visit: Payer: Self-pay | Admitting: Nurse Practitioner

## 2022-08-23 DIAGNOSIS — E538 Deficiency of other specified B group vitamins: Secondary | ICD-10-CM

## 2022-08-23 MED ORDER — CYANOCOBALAMIN 1000 MCG/ML IJ SOLN: 1000.0000 ug | INTRAMUSCULAR | 4 refills | Status: DC

## 2022-08-23 NOTE — Progress Notes (Signed)
This encounter was created in error - please disregard.

## 2022-09-03 ENCOUNTER — Ambulatory Visit: Payer: 59

## 2022-09-03 ENCOUNTER — Other Ambulatory Visit: Payer: Self-pay

## 2022-09-03 ENCOUNTER — Telehealth: Payer: Self-pay | Admitting: Nurse Practitioner

## 2022-09-03 DIAGNOSIS — E538 Deficiency of other specified B group vitamins: Secondary | ICD-10-CM

## 2022-09-03 MED ORDER — CYANOCOBALAMIN 1000 MCG/ML IJ SOLN: 1000.0000 ug | Freq: Once | INTRAMUSCULAR | 0 refills | Status: AC

## 2022-09-03 NOTE — Telephone Encounter (Signed)
Patient is trying to get a B12 shot at [facility] per Stephannie Peters advice.  [facility] told patient that Judson Roch would need to fax over an order with a diagnosis.  Please fax to [phone]

## 2022-09-03 NOTE — Telephone Encounter (Signed)
Made pt aware of order being faxed and also called [facility] and left voice mail for it being faxed and to call our office for any further questions

## 2022-09-06 ENCOUNTER — Encounter: Payer: Self-pay | Admitting: Nurse Practitioner

## 2022-09-06 ENCOUNTER — Other Ambulatory Visit: Payer: Self-pay

## 2022-09-06 MED ORDER — CYANOCOBALAMIN 1000 MCG/ML IJ SOLN: 1000.0000 ug | INTRAMUSCULAR | 0 refills | Status: DC

## 2022-09-22 ENCOUNTER — Encounter: Payer: Self-pay | Admitting: Nurse Practitioner

## 2022-09-24 ENCOUNTER — Other Ambulatory Visit: Payer: Self-pay

## 2022-09-24 DIAGNOSIS — E786 Lipoprotein deficiency: Secondary | ICD-10-CM

## 2022-09-24 MED ORDER — FENOFIBRATE 54 MG PO TABS: ORAL_TABLET | ORAL | 1 refills | Status: DC

## 2022-09-27 ENCOUNTER — Encounter: Payer: Self-pay | Admitting: Nurse Practitioner

## 2022-09-27 MED ORDER — EMPAGLIFLOZIN 25 MG PO TABS: 25.0000 mg | ORAL_TABLET | Freq: Every day | ORAL | 1 refills | Status: DC

## 2022-09-28 ENCOUNTER — Telehealth: Payer: Self-pay

## 2022-09-28 ENCOUNTER — Encounter: Payer: Self-pay | Admitting: Nurse Practitioner

## 2022-09-28 NOTE — Telephone Encounter (Signed)
Jardiance 25MG tablets   Key: [id number]

## 2022-10-04 ENCOUNTER — Encounter: Payer: Self-pay | Admitting: Nurse Practitioner

## 2022-10-06 ENCOUNTER — Encounter: Payer: Self-pay | Admitting: Nurse Practitioner

## 2022-10-08 ENCOUNTER — Other Ambulatory Visit: Payer: Self-pay

## 2022-10-08 DIAGNOSIS — I10 Essential (primary) hypertension: Secondary | ICD-10-CM

## 2022-10-08 DIAGNOSIS — E034 Atrophy of thyroid (acquired): Secondary | ICD-10-CM

## 2022-10-08 DIAGNOSIS — F329 Major depressive disorder, single episode, unspecified: Secondary | ICD-10-CM

## 2022-10-08 MED ORDER — LISINOPRIL-HYDROCHLOROTHIAZIDE 10-12.5 MG PO TABS: 1.0000 | ORAL_TABLET | Freq: Every day | ORAL | 1 refills | Status: DC

## 2022-10-08 MED ORDER — LEVOTHYROXINE SODIUM 75 MCG PO TABS: ORAL_TABLET | ORAL | 3 refills | Status: DC

## 2022-10-08 MED ORDER — SERTRALINE HCL 50 MG PO TABS: ORAL_TABLET | ORAL | 1 refills | Status: DC

## 2022-10-08 MED ORDER — PIOGLITAZONE HCL 15 MG PO TABS: 15.0000 mg | ORAL_TABLET | Freq: Every day | ORAL | 1 refills | Status: DC

## 2022-10-08 MED ORDER — ATORVASTATIN CALCIUM 10 MG PO TABS: 10.0000 mg | ORAL_TABLET | Freq: Every day | ORAL | 1 refills | Status: DC

## 2022-10-10 ENCOUNTER — Encounter: Payer: Self-pay | Admitting: Nurse Practitioner

## 2022-10-11 ENCOUNTER — Encounter: Payer: Self-pay | Admitting: Family

## 2022-10-11 ENCOUNTER — Other Ambulatory Visit: Payer: Self-pay | Admitting: Nurse Practitioner

## 2022-10-11 ENCOUNTER — Other Ambulatory Visit (HOSPITAL_COMMUNITY): Payer: Self-pay

## 2022-10-11 DIAGNOSIS — E1165 Type 2 diabetes mellitus with hyperglycemia: Secondary | ICD-10-CM

## 2022-10-11 MED ORDER — TRULICITY 0.75 MG/0.5ML ~~LOC~~ SOAJ
0.7500 mg | SUBCUTANEOUS | 3 refills | Status: DC
  Filled 2022-10-11: qty 2, 28d supply, fill #0
  Filled 2022-11-10 (×2): qty 2, 28d supply, fill #1

## 2022-10-13 ENCOUNTER — Other Ambulatory Visit: Payer: Self-pay

## 2022-10-13 ENCOUNTER — Emergency Department (HOSPITAL_BASED_OUTPATIENT_CLINIC_OR_DEPARTMENT_OTHER)
Admission: EM | Admit: 2022-10-13 | Discharge: 2022-10-13 | Disposition: A | Discharge status: Home / Self Care | Payer: 59 | Attending: Emergency Medicine | Admitting: Emergency Medicine

## 2022-10-13 ENCOUNTER — Encounter (HOSPITAL_BASED_OUTPATIENT_CLINIC_OR_DEPARTMENT_OTHER): Payer: Self-pay | Admitting: Emergency Medicine

## 2022-10-13 DIAGNOSIS — E119 Type 2 diabetes mellitus without complications: Secondary | ICD-10-CM | POA: Diagnosis not present

## 2022-10-13 DIAGNOSIS — Z7984 Long term (current) use of oral hypoglycemic drugs: Secondary | ICD-10-CM | POA: Diagnosis not present

## 2022-10-13 DIAGNOSIS — G510 Bell's palsy: Secondary | ICD-10-CM | POA: Diagnosis not present

## 2022-10-13 DIAGNOSIS — R29810 Facial weakness: Secondary | ICD-10-CM | POA: Diagnosis present

## 2022-10-13 DIAGNOSIS — Z794 Long term (current) use of insulin: Secondary | ICD-10-CM | POA: Diagnosis not present

## 2022-10-13 MED ORDER — VALACYCLOVIR HCL 1 G PO TABS: 1000.0000 mg | ORAL_TABLET | Freq: Three times a day (TID) | ORAL | 0 refills | Status: AC

## 2022-10-13 NOTE — ED Triage Notes (Signed)
Wednesday night started feeling eye was fluttering ,right facial droop Thursday, no weakness, no neuro changes.pt wondering if its bells palsy.

## 2022-10-13 NOTE — ED Provider Notes (Signed)
[facility] Provider Note   CSN: [id number] Arrival date & time: 10/13/22  1540     History  No chief complaint on file.   [name] is a 57 y.o. female presenting to ED with right-sided facial droop for 2 days.  Onset of symptoms with twitching in the right side of her mouth on Thursday.  This morning she noted that she was having difficult time closing her right eye and the right side of her face seemed lax.  He has a family history of Bell's palsy and her father was wondering if this is the same.  She denies any new medications.  Denies any known tick bites.  Denies any recent herpes simplex outbreaks  HPI     Home Medications Prior to Admission medications   Medication Sig Start Date End Date Taking? Authorizing Provider  cyanocobalamin (VITAMIN B12) 1000 MCG/ML injection Inject 1 mL (1,000 mcg total) into the muscle every 30 (thirty) days. 08/23/22   Ailene Ards, NP  Dulaglutide (TRULICITY) A999333 [id number] SOPN Inject 0.75 mg into the skin once a week. 10/11/22   Ailene Ards, NP  valACYclovir (VALTREX) 1000 MG tablet Take 1 tablet (1,000 mg total) by mouth 3 (three) times daily for 7 days. 10/13/22 10/20/22 Yes Yordi Krager, Carola Rhine, MD  atorvastatin (LIPITOR) 10 MG tablet Take 1 tablet (10 mg total) by mouth daily at 6 (six) AM. 10/08/22   Ailene Ards, NP  Blood Glucose Monitoring Suppl (ONE TOUCH ULTRA 2) w/Device KIT 1 each by Does not apply route daily. Use to check blood sugar daily 02/15/22   Ailene Ards, NP  cyanocobalamin (VITAMIN B12) 1000 MCG/ML injection Inject 1 mL (1,000 mcg total) into the muscle every 30 (thirty) days. 09/06/22   Ailene Ards, NP  empagliflozin (JARDIANCE) 25 MG TABS tablet Take 1 tablet (25 mg total) by mouth daily before breakfast. 09/27/22   Biagio Borg, MD  fenofibrate 54 MG tablet TAKE 2 TABLETS(108 MG) BY MOUTH DAILY 09/24/22   Ailene Ards, NP  glucose blood [facility] ULTRA) test strip Use  as instructed 02/15/22   Ailene Ards, NP  levothyroxine (SYNTHROID) 75 MCG tablet Take 1 tablet (75MCG) by mouth daily 10/08/22   Ailene Ards, NP  lisinopril-hydrochlorothiazide (ZESTORETIC) 10-12.5 MG tablet Take 1 tablet by mouth daily. 10/08/22   Ailene Ards, NP  metFORMIN (GLUCOPHAGE) 500 MG tablet Take 2 tablets (1,000 mg total) by mouth 2 (two) times daily with a meal. Take two tablets by mouth (1000 mg) two times a day with meals. 07/24/22   Ailene Ards, NP  Multiple Vitamin (MULTIVITAMIN) tablet Take 1 tablet by mouth daily.      [provider]  OneTouch Delica Lancets [id number] MISC 1 each by Does not apply route daily. 02/15/22   Ailene Ards, NP  pioglitazone (ACTOS) 15 MG tablet Take 1 tablet (15 mg total) by mouth daily at 6 (six) AM. 10/08/22   Ailene Ards, NP  sertraline (ZOLOFT) 50 MG tablet TAKE 1 TABLET(50 MG) BY MOUTH DAILY 10/08/22   Ailene Ards, NP  vitamin C (ASCORBIC ACID) 500 MG tablet Take 1,000 mg by mouth daily.     [provider]  VITAMIN D PO Take 2,000 Units by mouth daily. 2000    [provider]      Allergies    Patient has no known allergies.    Review of Systems  Review of Systems  Physical Exam Updated Vital Signs BP (!) 158/96   Pulse 88   Temp 97.8 F (36.6 C)   Resp 18   SpO2 97%  Physical Exam Constitutional:      General: She is not in acute distress. HENT:     Head: Normocephalic and atraumatic.  Eyes:     Conjunctiva/sclera: Conjunctivae normal.     Pupils: Pupils are equal, round, and reactive to light.  Cardiovascular:     Rate and Rhythm: Normal rate and regular rhythm.  Pulmonary:     Effort: Pulmonary effort is normal. No respiratory distress.  Abdominal:     General: There is no distension.     Tenderness: There is no abdominal tenderness.  Skin:    General: Skin is warm and dry.  Neurological:     General: No focal deficit present.     Mental Status: She is alert.     Sensory: No sensory  deficit.     Motor: No weakness.     Comments: Facial droop involving the forehead muscles as well as the lower muscles of the face on the right side.  Patient has difficulty closing her eyelid but is able to do so.  Psychiatric:        Mood and Affect: Mood normal.        Behavior: Behavior normal.     ED Results / Procedures / Treatments   Labs (all labs ordered are listed, but only abnormal results are displayed) Labs Reviewed - No data to display  EKG None  Radiology No results found.  Procedures Procedures    Medications Ordered in ED Medications - No data to display  ED Course/ Medical Decision Making/ A&P                             Medical Decision Making  This is a 57 year old female presenting to ED with right-sided facial droop involving the forehead muscle.  This is highly consistent with Bell's palsy and peripheral neuropathy.  Less likely a stroke.  No indication for neuroimaging at this time.  She is diabetic on insulin with difficulty controlling her blood sugars so we will avoid steroids in this case.  Will try Valtrex at home.  We discussed how to tape her eye shut and keep her eye moisturized in the daytime.  She will follow-up with her PCP.  She verbalized understanding.        Final Clinical Impression(s) / ED Diagnoses Final diagnoses:  Bell's palsy    Rx / DC Orders ED Discharge Orders          Ordered    valACYclovir (VALTREX) 1000 MG tablet  3 times daily        10/13/22 1623              Onesimo Lingard, Carola Rhine, MD 10/13/22 1623

## 2022-10-15 ENCOUNTER — Telehealth: Payer: Self-pay

## 2022-10-15 NOTE — Transitions of Care (Post Inpatient/ED Visit) (Unsigned)
   10/15/2022  Name: [name] MRN: [id number] DOB: 06/02/1966  Today's TOC FU Call Status: Today's TOC FU Call Status:: Unsuccessul Call (1st Attempt) Unsuccessful Call (1st Attempt) Date: 10/15/22  Attempted to reach the patient regarding the most recent Inpatient/ED visit.  Follow Up Plan: Additional outreach attempts will be made to reach the patient to complete the Transitions of Care (Post Inpatient/ED visit) call.   Signature Juanda Crumble, [place] Direct Dial [phone]

## 2022-10-16 NOTE — Transitions of Care (Post Inpatient/ED Visit) (Signed)
   10/16/2022  Name: [name] MRN: [id number] DOB: [birth year]  Today's TOC FU Call Status: Today's TOC FU Call Status:: Successful TOC FU Call Competed Unsuccessful Call (1st Attempt) Date: 10/15/22 [facility] FU Call Complete Date: 10/16/22  Transition Care Management Follow-up Telephone Call Date of Discharge: 10/13/22 Discharge Facility: [facility] ([facility]) Type of Discharge: Emergency Department Reason for ED Visit: Other: (Bell's palsy) How have you been since you were released from the hospital?: Better Any questions or concerns?: No  Items Reviewed: Did you receive and understand the discharge instructions provided?: Yes Medications obtained and verified?: Yes (Medications Reviewed) Any new allergies since your discharge?: No Dietary orders reviewed?: Yes Do you have support at home?: Yes People in Home: spouse  Home Care and Equipment/Supplies: [place] Ordered?: NA Any new equipment or medical supplies ordered?: NA  Functional Questionnaire: Do you need assistance with bathing/showering or dressing?: No Do you need assistance with meal preparation?: No Do you need assistance with eating?: No Do you have difficulty maintaining continence: No Do you need assistance with getting out of bed/getting out of a chair/moving?: No Do you have difficulty managing or taking your medications?: No  Folllow up appointments reviewed: PCP Follow-up appointment confirmed?: NA Specialist Hospital Follow-up appointment confirmed?: NA Do you need transportation to your follow-up appointment?: No Do you understand care options if your condition(s) worsen?: Yes-patient verbalized understanding    [place], [place] Direct Dial [phone]

## 2022-10-21 IMAGING — MG MM DIGITAL SCREENING BILAT W/ TOMO AND CAD
8 series · 8 of 24 positions shown · non-contrast
Comparison: Previous exam(s).

CLINICAL DATA: Screening.

EXAM:
DIGITAL SCREENING BILATERAL MAMMOGRAM WITH TOMOSYNTHESIS AND CAD
TECHNIQUE: Bilateral screening digital craniocaudal and mediolateral oblique
mammograms were obtained. Bilateral screening digital breast
tomosynthesis was performed. The images were evaluated with
computer-aided detection.

[L MLO synth-2D]
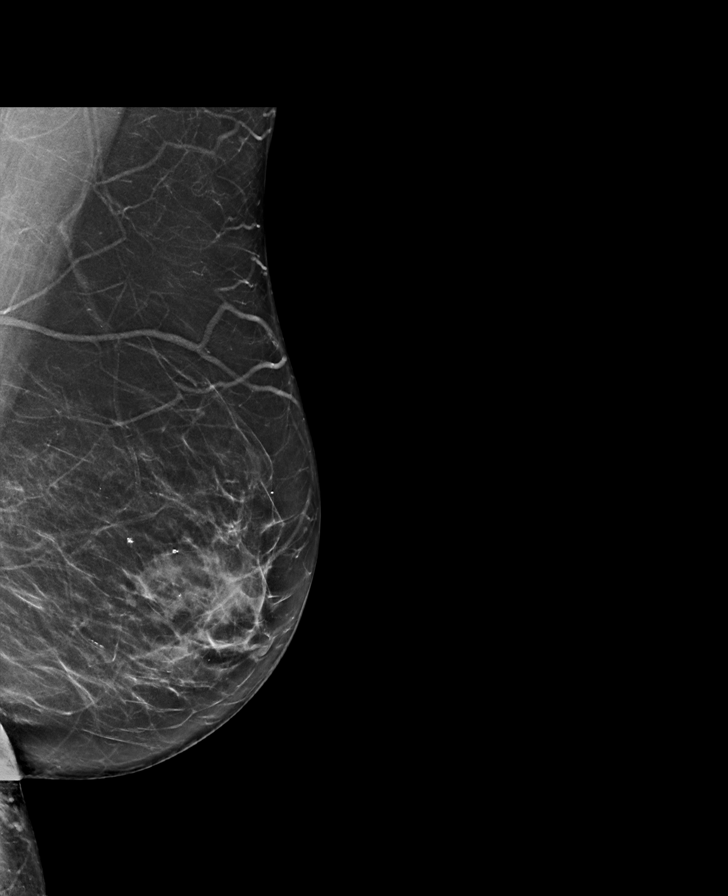

[L CC synth-2D]
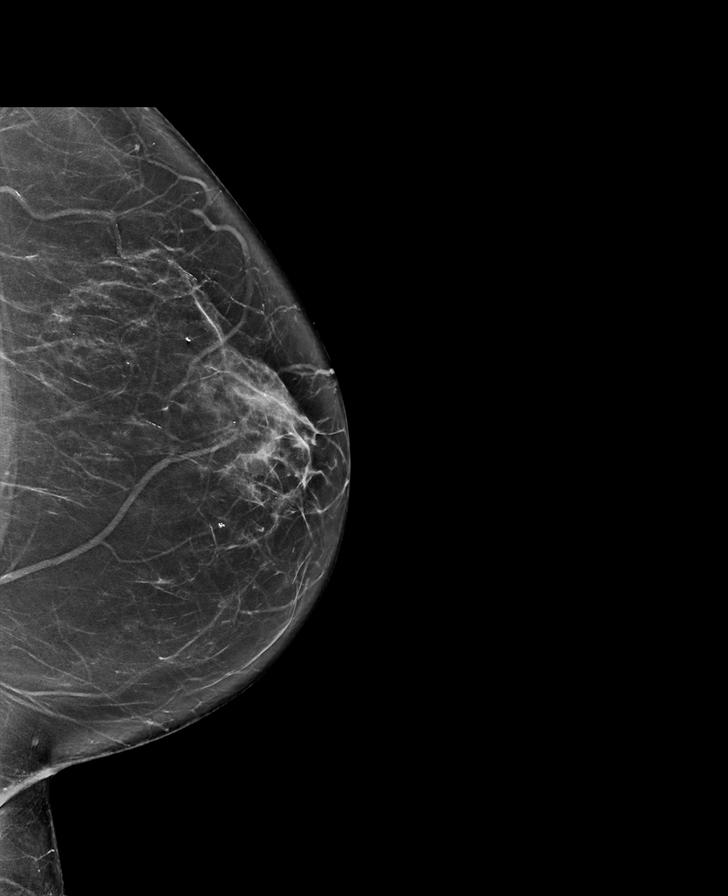

[R CC synth-2D]
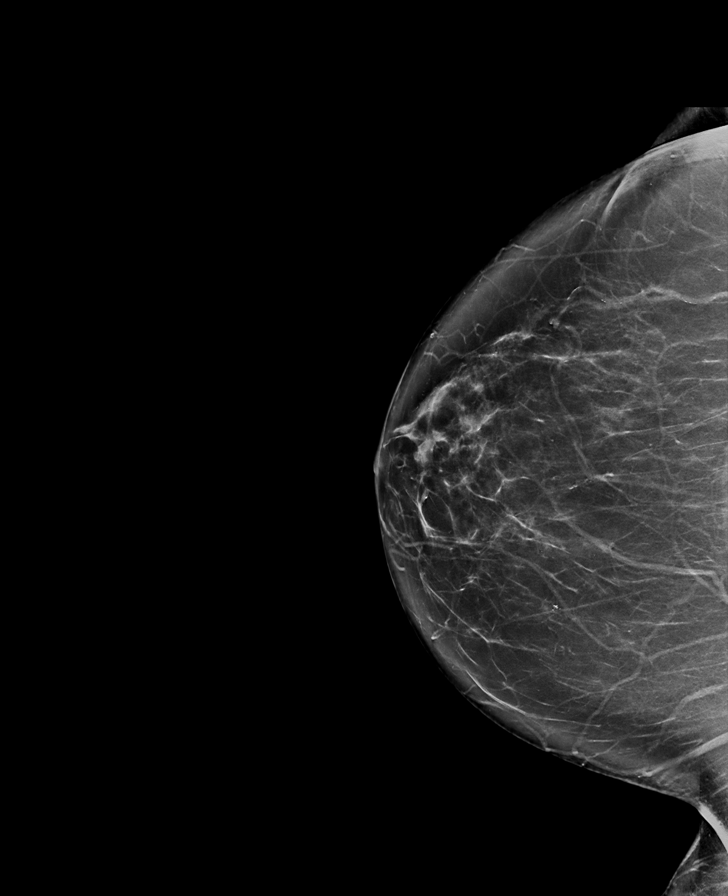

[R MLO synth-2D]
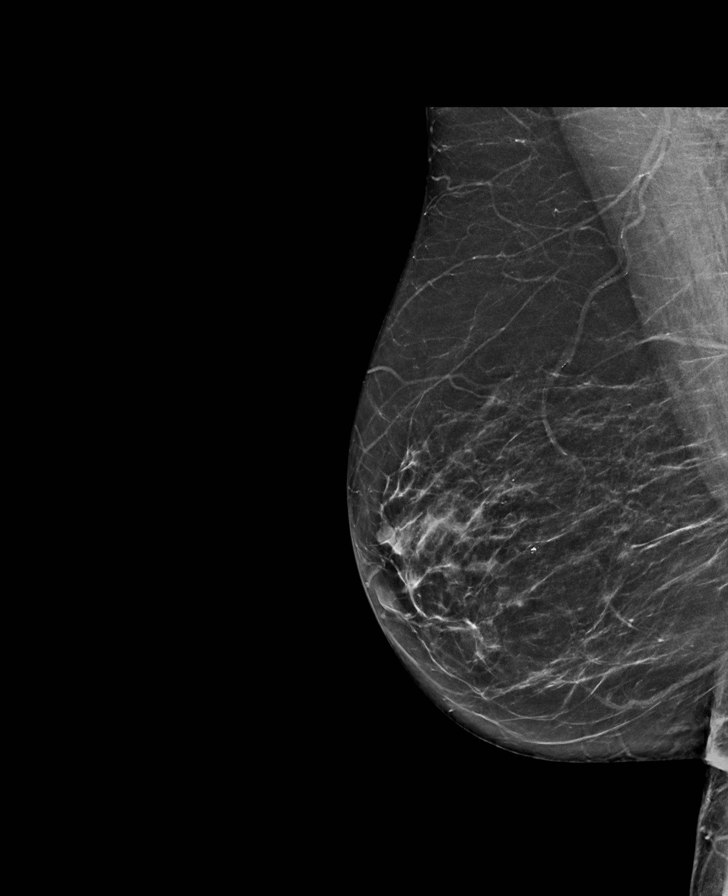

[R CC tomo · tomo slice 41/80.0]
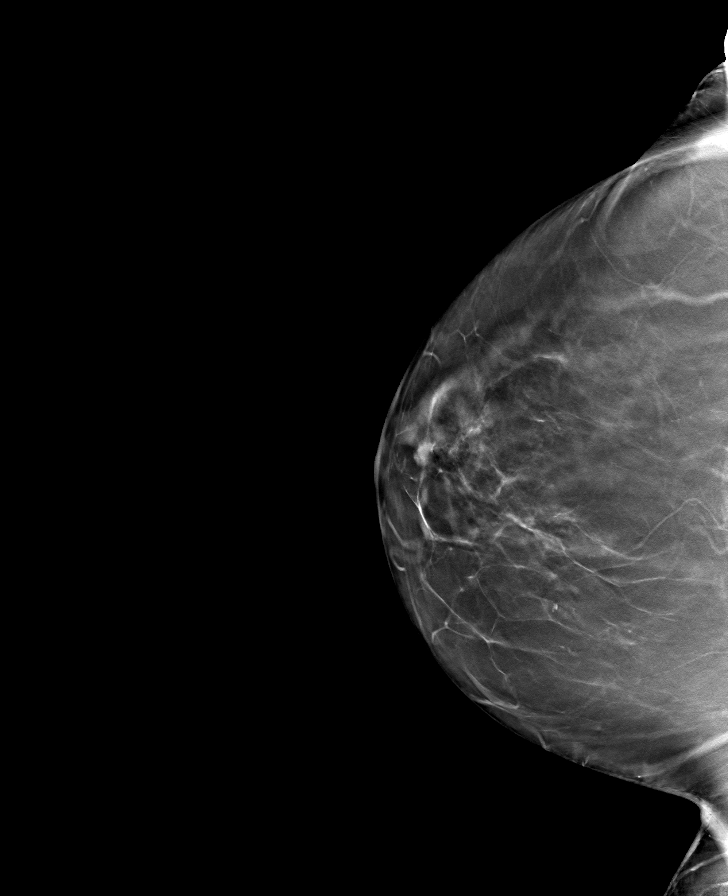

[L MLO tomo · tomo slice 38/75.0]
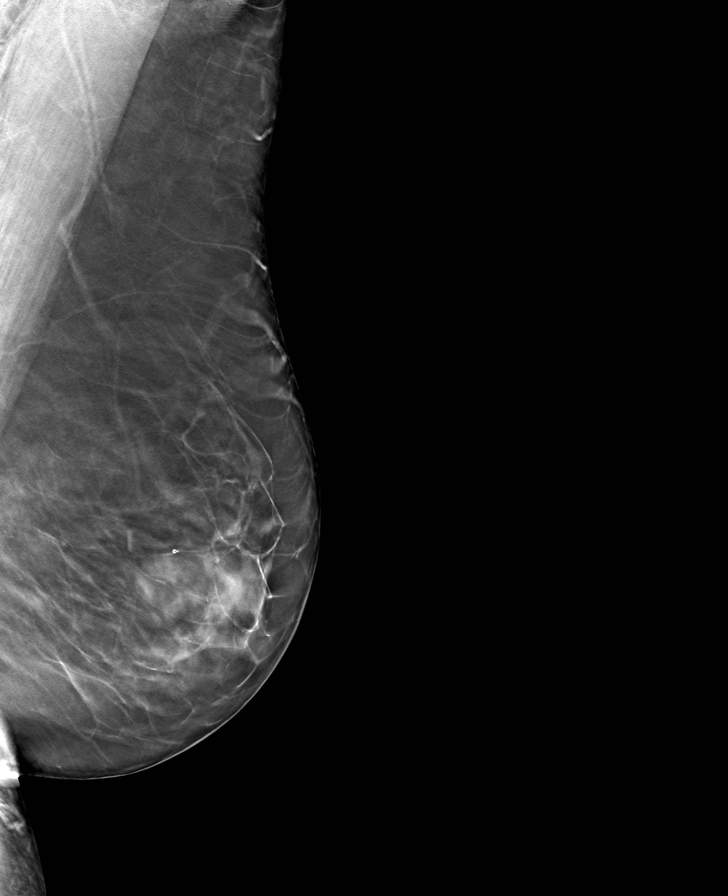

[R MLO tomo · tomo slice 36/71.0]
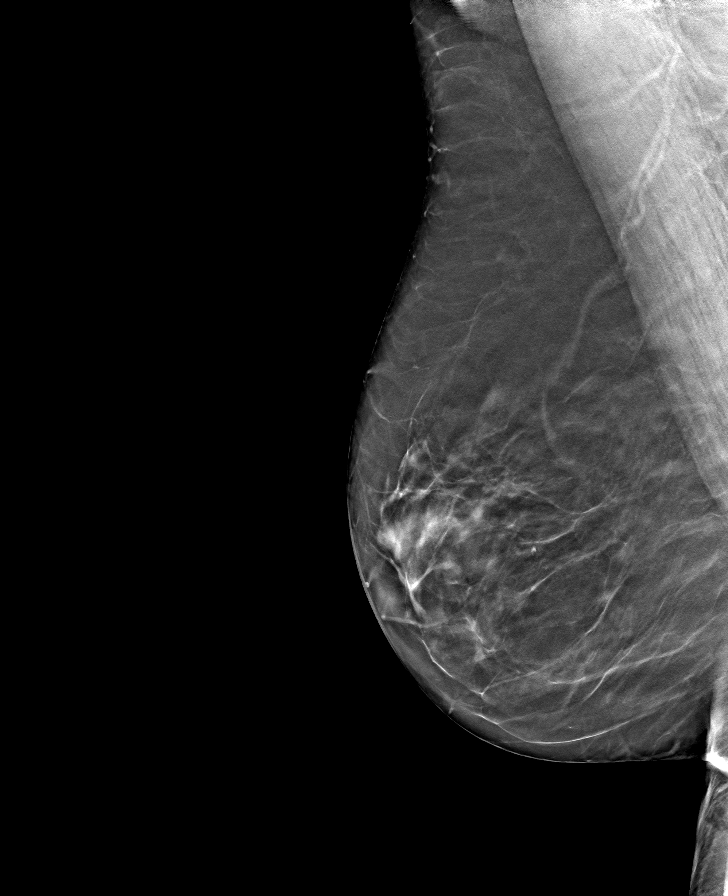

[L CC tomo · tomo slice 37/73.0]
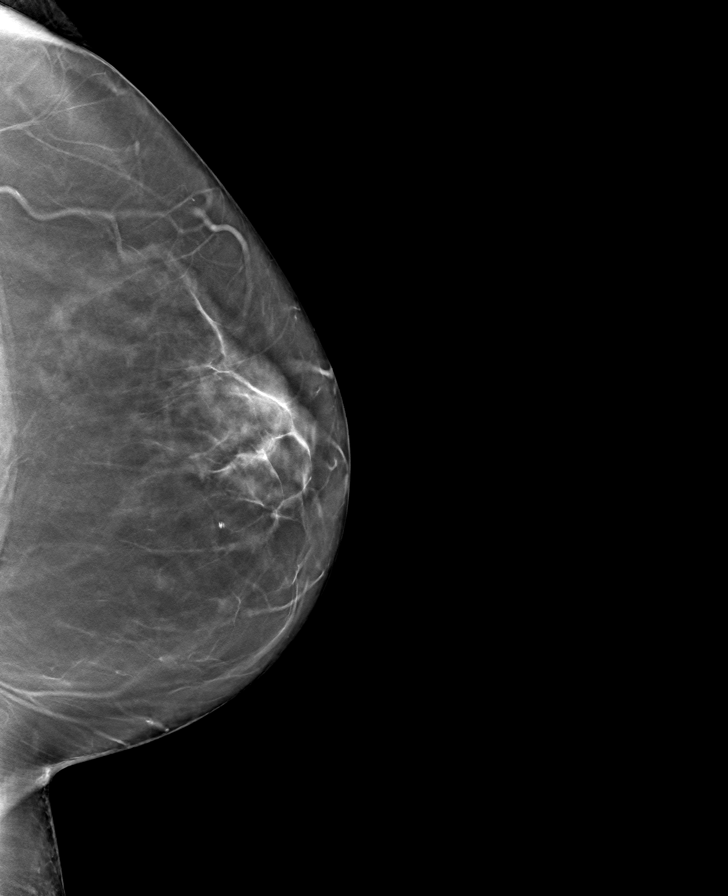

[8 of 24 positions shown; findings below may reference images not displayed]

ACR Breast Density Category c: The breast tissue is heterogeneously
dense, which may obscure small masses.
FINDINGS: There are no findings suspicious for malignancy.
IMPRESSION: No mammographic evidence of malignancy. A result letter of this
screening mammogram will be mailed directly to the patient.

RECOMMENDATION:
Screening mammogram in one year. (Code:Q3-W-BC3)

BI-RADS CATEGORY  1: Negative.

## 2022-10-25 ENCOUNTER — Ambulatory Visit: Payer: 59 | Admitting: Nurse Practitioner

## 2022-11-10 ENCOUNTER — Other Ambulatory Visit (HOSPITAL_BASED_OUTPATIENT_CLINIC_OR_DEPARTMENT_OTHER): Payer: Self-pay

## 2022-12-06 ENCOUNTER — Encounter: Payer: 59 | Admitting: Nurse Practitioner

## 2022-12-07 ENCOUNTER — Other Ambulatory Visit: Payer: Self-pay | Admitting: Nurse Practitioner

## 2022-12-21 ENCOUNTER — Ambulatory Visit (INDEPENDENT_AMBULATORY_CARE_PROVIDER_SITE_OTHER): Payer: 59 | Admitting: Nurse Practitioner

## 2022-12-21 VITALS — BP 120/72 | HR 81 | Temp 97.9°F | Ht 68.0 in | Wt 210.5 lb

## 2022-12-21 DIAGNOSIS — Z0001 Encounter for general adult medical examination with abnormal findings: Secondary | ICD-10-CM | POA: Insufficient documentation

## 2022-12-21 DIAGNOSIS — E034 Atrophy of thyroid (acquired): Secondary | ICD-10-CM

## 2022-12-21 DIAGNOSIS — E785 Hyperlipidemia, unspecified: Secondary | ICD-10-CM | POA: Diagnosis not present

## 2022-12-21 DIAGNOSIS — E1165 Type 2 diabetes mellitus with hyperglycemia: Secondary | ICD-10-CM | POA: Diagnosis not present

## 2022-12-21 DIAGNOSIS — Z7984 Long term (current) use of oral hypoglycemic drugs: Secondary | ICD-10-CM | POA: Diagnosis not present

## 2022-12-21 LAB — COMPREHENSIVE METABOLIC PANEL
ALT: 23 U/L (ref 0–35)
AST: 19 U/L (ref 0–37)
Albumin: 4.7 g/dL (ref 3.5–5.2)
Alkaline Phosphatase: 67 U/L (ref 39–117)
BUN: 18 mg/dL (ref 6–23)
CO2: 23 mEq/L (ref 19–32)
Calcium: 9.9 mg/dL (ref 8.4–10.5)
Chloride: 102 mEq/L (ref 96–112)
Creatinine, Ser: 0.86 mg/dL (ref 0.40–1.20)
GFR: 74.8 mL/min (ref >60.00)
Glucose, Bld: 201 mg/dL — ABNORMAL HIGH (ref 70–99)
Potassium: 3.9 mEq/L (ref 3.5–5.1)
Sodium: 136 mEq/L (ref 135–145)
Total Bilirubin: 0.4 mg/dL (ref 0.2–1.2)
Total Protein: 7.6 g/dL (ref 6.0–8.3)

## 2022-12-21 LAB — CBC
HCT: 41.7 % (ref 36.0–46.0)
Hemoglobin: 13.7 g/dL (ref 12.0–15.0)
MCHC: 32.8 g/dL (ref 30.0–36.0)
MCV: 80.1 fl (ref 78.0–100.0)
Platelets: 261 10*3/uL (ref 150.0–400.0)
RBC: 5.21 Mil/uL — ABNORMAL HIGH (ref 3.87–5.11)
RDW: 15.3 % (ref 11.5–15.5)
WBC: 7.2 10*3/uL (ref 4.0–10.5)

## 2022-12-21 LAB — TSH: TSH: 2.66 u[IU]/mL (ref 0.35–5.50)

## 2022-12-21 NOTE — Assessment & Plan Note (Signed)
Chronic, stable Continue levothyroxine 75 mcg daily Check TSH level today, adjust levothyroxine accordingly

## 2022-12-21 NOTE — Assessment & Plan Note (Signed)
Chronic, stable I am not aware of fenofibrate affecting vitamin B12 levels.  However due to patient's concern that she does have vitamin B12 deficiency will discontinue fenofibrate instead start patient on omega-3 supplementation.  She will continue on atorvastatin 10 mg daily. Will plan on checking fasting lipid panel at next office visit in 3 months.

## 2022-12-21 NOTE — Assessment & Plan Note (Signed)
Patient up-to-date on routine screening recommendations.  Patient courage to continue focusing on healthy lifestyle.

## 2022-12-21 NOTE — Progress Notes (Signed)
Established Patient Office Visit  Subjective   Patient ID: [name], female    DOB: [birth year]  Age: 57 y.o. MRN: [id number]  Chief Complaint  Patient presents with   Annual Exam    Annual Exam: diabetic eye exam: 03/2022, diabetic foot exam: 03/2022, mammogam: 05/14/2022, tdap: 05/11/20, colonoscopy: 10/20/21 (due every 10 years), Hep c screen: completed, HIV screen: completed, zoster vaccine: completed. S/p hysterectomy was told she no longer needs routine pap smears  T2DM/HLD: A1c in 12/23 was 8.6.  Reports that she finally got insurance to approve Jardiance a few weeks ago.  She is currently on Jardiance 25 mg/day, metformin 1000 mg twice daily, and Actos 15 mg daily.  Denies any shortness of breath or worsening edema.  Inconsistently checks her blood sugars, does admit to some stress eating and has seen fasting blood sugars near 150 over the last week or so.  Feels that this is related to her dietary choices.  No dysuria or groin pain today.  Last LDL 54, patient continues on atorvastatin 10 mg daily.  She also has vitamin B12 deficiency and tells me that a provider at a different office stated that fenofibrate may affect her vitamin B12.  On ACE inhibitor.  Hypothyroidism: Continues on levothyroxine 75 mcg daily, last TSH 2.74     Review of Systems  Constitutional:  Negative for chills, fever and weight loss.  HENT:  Negative for congestion and sinus pain.   Eyes:  Negative for blurred vision and double vision.  Respiratory:  Negative for cough, shortness of breath and wheezing.   Cardiovascular:  Negative for chest pain and palpitations.  Gastrointestinal:  Negative for blood in stool, constipation, diarrhea, nausea and vomiting.  Genitourinary:  Negative for hematuria.  Skin:  Negative for itching and rash.  Neurological:  Negative for dizziness and loss of consciousness.  Psychiatric/Behavioral:  Negative for depression and suicidal ideas. The patient is  nervous/anxious. The patient does not have insomnia.       Objective:     BP 120/72   Pulse 81   Temp 97.9 F (36.6 C) (Oral)   Ht 5\' 8"  (1.727 m)   Wt 210 lb 8 oz (95.5 kg)   SpO2 97%   BMI 32.01 kg/m  BP Readings from Last 3 Encounters:  12/21/22 120/72  10/13/22 (!) 158/96  07/20/22 122/78   Wt Readings from Last 3 Encounters:  12/21/22 210 lb 8 oz (95.5 kg)  07/20/22 218 lb 6 oz (99.1 kg)  04/11/22 221 lb 6 oz (100.4 kg)        12/21/2022    8:06 AM 07/20/2022   10:59 AM 04/11/2022    9:44 AM  PHQ9 SCORE ONLY  PHQ-9 Total Score 0 0 0     Physical Exam Vitals reviewed. Exam conducted with a chaperone present.  Constitutional:      Appearance: Normal appearance.  HENT:     Head: Normocephalic and atraumatic.     Right Ear: Tympanic membrane, ear canal and external ear normal.     Left Ear: Tympanic membrane, ear canal and external ear normal.  Eyes:     General:        Right eye: No discharge.        Left eye: No discharge.     Extraocular Movements: Extraocular movements intact.     Conjunctiva/sclera: Conjunctivae normal.     Pupils: Pupils are equal, round, and reactive to light.  Neck:     Vascular: No  carotid bruit.  Cardiovascular:     Rate and Rhythm: Normal rate and regular rhythm.     Pulses: Normal pulses.     Heart sounds: Normal heart sounds. No murmur heard. Pulmonary:     Effort: Pulmonary effort is normal.     Breath sounds: Normal breath sounds.  Chest:  Breasts:    Breasts are symmetrical.     Right: Normal.     Left: Normal.  Abdominal:     General: Abdomen is flat. Bowel sounds are normal. There is no distension.     Palpations: Abdomen is soft. There is no mass.     Tenderness: There is no abdominal tenderness.  Musculoskeletal:        General: No tenderness.     Cervical back: Neck supple. No muscular tenderness.     Right lower leg: No edema.     Left lower leg: No edema.  Lymphadenopathy:     Cervical: No cervical  adenopathy.     Upper Body:     Right upper body: No supraclavicular adenopathy.     Left upper body: No supraclavicular adenopathy.  Skin:    General: Skin is warm and dry.  Neurological:     General: No focal deficit present.     Mental Status: She is alert and oriented to person, place, and time.     Motor: No weakness.     Gait: Gait normal.  Psychiatric:        Mood and Affect: Mood normal.        Behavior: Behavior normal.        Judgment: Judgment normal.      No results found for any visits on 12/21/22.    The ASCVD Risk score (Arnett DK, et al., 2019) failed to calculate for the following reasons:   The valid total cholesterol range is 130 to 320 mg/dL    Assessment & Plan:   Problem List Items Addressed This Visit       Endocrine   Hypothyroidism    Chronic, stable Continue levothyroxine 75 mcg daily Check TSH level today, adjust levothyroxine accordingly      Relevant Orders   Comprehensive metabolic panel   CBC   TSH   Type 2 diabetes mellitus with hyperglycemia, without long-term current use of insulin (HCC)    Chronic, last A1c above goal.  Goal is less than 7. As she just started Jardiance a couple weeks ago, will check A1c at next office visit to determine its impact on blood sugars. For now continue Jardiance 25 mg daily, metformin 2000 mg twice daily, and Actos 15 mg daily. Check metabolic panel, further recommendations may be made based upon the results Patient encouraged to focus on healthy diet and to monitor fasting blood sugars regularly. Continue on ARB and ACE inhibitor       Relevant Orders   Comprehensive metabolic panel   CBC   TSH     Other   Hyperlipidemia    Chronic, stable I am not aware of fenofibrate affecting vitamin B12 levels.  However due to patient's concern that she does have vitamin B12 deficiency will discontinue fenofibrate instead start patient on omega-3 supplementation.  She will continue on atorvastatin 10 mg  daily. Will plan on checking fasting lipid panel at next office visit in 3 months.      Relevant Medications   omega-3 acid ethyl esters (LOVAZA) 1 g capsule   Encounter for general adult medical examination with abnormal findings -  Primary    Patient up-to-date on routine screening recommendations.  Patient courage to continue focusing on healthy lifestyle.      Relevant Orders   Comprehensive metabolic panel   CBC   TSH    Return in about 3 months (around 03/23/2023) for F/U with Delayla Hoffmaster, HLD, T2DM.  In addition to annual physical exam also performed office visit as detailed above   Elenore Paddy, NP

## 2022-12-21 NOTE — Assessment & Plan Note (Signed)
Chronic, last A1c above goal.  Goal is less than 7. As she just started Jardiance a couple weeks ago, will check A1c at next office visit to determine its impact on blood sugars. For now continue Jardiance 25 mg daily, metformin 2000 mg twice daily, and Actos 15 mg daily. Check metabolic panel, further recommendations may be made based upon the results Patient encouraged to focus on healthy diet and to monitor fasting blood sugars regularly. Continue on ARB and ACE inhibitor

## 2023-01-19 ENCOUNTER — Other Ambulatory Visit: Payer: Self-pay | Admitting: Nurse Practitioner

## 2023-01-19 ENCOUNTER — Encounter: Payer: Self-pay | Admitting: Nurse Practitioner

## 2023-01-21 MED ORDER — METFORMIN HCL 500 MG PO TABS: 1000.0000 mg | ORAL_TABLET | Freq: Two times a day (BID) | ORAL | 0 refills | Status: DC

## 2023-03-13 ENCOUNTER — Encounter (INDEPENDENT_AMBULATORY_CARE_PROVIDER_SITE_OTHER): Payer: Self-pay

## 2023-03-28 ENCOUNTER — Encounter (INDEPENDENT_AMBULATORY_CARE_PROVIDER_SITE_OTHER): Payer: Self-pay

## 2023-04-05 ENCOUNTER — Ambulatory Visit: Payer: 59 | Admitting: Nurse Practitioner

## 2023-04-22 ENCOUNTER — Ambulatory Visit (INDEPENDENT_AMBULATORY_CARE_PROVIDER_SITE_OTHER): Payer: 59

## 2023-04-22 DIAGNOSIS — Z111 Encounter for screening for respiratory tuberculosis: Secondary | ICD-10-CM

## 2023-04-22 MED ORDER — CYANOCOBALAMIN 1000 MCG/ML IJ SOLN: 1000.0000 ug | Freq: Once | INTRAMUSCULAR | Status: DC

## 2023-04-22 NOTE — Progress Notes (Cosign Needed)
Pt came in for TB skin Test, injectio was given in the left forearm> pt was told to return on 04/24/23 for the reading for of it.   Medical screening examination/treatment/procedure(s) were performed by non-physician practitioner and as supervising physician I was immediately available for consultation/collaboration.  I agree with above. Jacinta Shoe, MD

## 2023-04-24 ENCOUNTER — Ambulatory Visit: Payer: 59

## 2023-04-24 LAB — TB SKIN TEST
Induration: 0 mm
TB Skin Test: NEGATIVE

## 2023-04-24 NOTE — Progress Notes (Signed)
Tuberculin skin test applied to left ventral forearm on 04/22/23. Pt came in today, 0mm induration, no erythema.

## 2023-04-26 ENCOUNTER — Telehealth: Payer: Self-pay | Admitting: Nurse Practitioner

## 2023-04-26 ENCOUNTER — Encounter: Payer: Self-pay | Admitting: Nurse Practitioner

## 2023-04-26 NOTE — Telephone Encounter (Signed)
This encounter was created in error - please disregard.

## 2023-04-26 NOTE — Telephone Encounter (Signed)
Let pt know forms are ready to pick up, pt stated she will be here today to pick it up

## 2023-04-26 NOTE — Telephone Encounter (Signed)
Please call patient and inform her that I have completed her paperwork and it is at the front desk for her to pick up at her earliest convenience.

## 2023-05-09 ENCOUNTER — Ambulatory Visit: Payer: 59 | Admitting: Nurse Practitioner

## 2023-05-09 VITALS — BP 124/82 | HR 85 | Temp 98.0°F | Ht 68.0 in | Wt 210.0 lb

## 2023-05-09 DIAGNOSIS — E785 Hyperlipidemia, unspecified: Secondary | ICD-10-CM | POA: Diagnosis not present

## 2023-05-09 DIAGNOSIS — E034 Atrophy of thyroid (acquired): Secondary | ICD-10-CM

## 2023-05-09 DIAGNOSIS — E1165 Type 2 diabetes mellitus with hyperglycemia: Secondary | ICD-10-CM | POA: Diagnosis not present

## 2023-05-09 DIAGNOSIS — E559 Vitamin D deficiency, unspecified: Secondary | ICD-10-CM

## 2023-05-09 DIAGNOSIS — E538 Deficiency of other specified B group vitamins: Secondary | ICD-10-CM

## 2023-05-09 DIAGNOSIS — Z7984 Long term (current) use of oral hypoglycemic drugs: Secondary | ICD-10-CM

## 2023-05-09 DIAGNOSIS — I10 Essential (primary) hypertension: Secondary | ICD-10-CM

## 2023-05-09 LAB — LIPID PANEL
Cholesterol: 147 mg/dL (ref 0–200)
HDL: 39.2 mg/dL (ref >39.00)
LDL Cholesterol: 78 mg/dL (ref 0–99)
NonHDL: 107.3
Total CHOL/HDL Ratio: 4
Triglycerides: 148 mg/dL (ref 0.0–149.0)
VLDL: 29.6 mg/dL (ref 0.0–40.0)

## 2023-05-09 LAB — COMPREHENSIVE METABOLIC PANEL
ALT: 18 U/L (ref 0–35)
AST: 13 U/L (ref 0–37)
Albumin: 4.5 g/dL (ref 3.5–5.2)
Alkaline Phosphatase: 69 U/L (ref 39–117)
BUN: 17 mg/dL (ref 6–23)
CO2: 24 mEq/L (ref 19–32)
Calcium: 9.5 mg/dL (ref 8.4–10.5)
Chloride: 101 mEq/L (ref 96–112)
Creatinine, Ser: 0.78 mg/dL (ref 0.40–1.20)
GFR: 83.87 mL/min (ref >60.00)
Glucose, Bld: 242 mg/dL — ABNORMAL HIGH (ref 70–99)
Potassium: 4.1 mEq/L (ref 3.5–5.1)
Sodium: 135 mEq/L (ref 135–145)
Total Bilirubin: 0.5 mg/dL (ref 0.2–1.2)
Total Protein: 7.4 g/dL (ref 6.0–8.3)

## 2023-05-09 LAB — TSH: TSH: 2.54 u[IU]/mL (ref 0.35–5.50)

## 2023-05-09 LAB — MICROALBUMIN / CREATININE URINE RATIO
Creatinine,U: 36.9 mg/dL
Microalb Creat Ratio: 1.9 mg/g (ref 0.0–30.0)
Microalb, Ur: <0.7 mg/dL (ref 0.0–1.9)

## 2023-05-09 LAB — CBC
HCT: 42.9 % (ref 36.0–46.0)
Hemoglobin: 14 g/dL (ref 12.0–15.0)
MCHC: 32.6 g/dL (ref 30.0–36.0)
MCV: 76.3 fl — ABNORMAL LOW (ref 78.0–100.0)
Platelets: 231 10*3/uL (ref 150.0–400.0)
RBC: 5.62 Mil/uL — ABNORMAL HIGH (ref 3.87–5.11)
RDW: 15.4 % (ref 11.5–15.5)
WBC: 7.4 10*3/uL (ref 4.0–10.5)

## 2023-05-09 LAB — HEMOGLOBIN A1C: Hgb A1c MFr Bld: 10.3 % — ABNORMAL HIGH (ref 4.6–6.5)

## 2023-05-09 LAB — VITAMIN D 25 HYDROXY (VIT D DEFICIENCY, FRACTURES): VITD: 42.55 ng/mL (ref 30.00–100.00)

## 2023-05-09 LAB — VITAMIN B12: Vitamin B-12: 279 pg/mL (ref 211–911)

## 2023-05-09 NOTE — Assessment & Plan Note (Signed)
Labs ordered, further recommendations may be made based upon these results. 

## 2023-05-09 NOTE — Assessment & Plan Note (Signed)
Chronic, last A1c above goal at 8.6 Goal is less than 7 Continue on Jardiance, metformin, Actos Check A1c today, further recommendations may be made based on these results Refer to ophthalmology for diabetic eye exam Foot exam completed today Up-to-date on immunizations except for COVID.  Recommend considering COVID-vaccine. Continue statin and ACEI

## 2023-05-09 NOTE — Assessment & Plan Note (Signed)
Chronic, stable Continue on omega 3 supplement and statin Check fasting lipid panel today, further recommendations may be made based upon the results

## 2023-05-09 NOTE — Progress Notes (Signed)
Established Patient Office Visit  Subjective   Patient ID: [name], female    DOB: 17-Jul-1966  Age: 57 y.o. MRN: [id number]  Chief Complaint  Patient presents with   Diabetes    Type 2 diabetes/HTN/HLD: Last A1c 8.6, this is an improvement from 12.6.Continues on Jardiance 25 mg/day, metformin 1000 mg twice a day, and Actos 50 mg daily.  She is also on atorvastatin and lisinopril that hydrochlorothiazide.  Due for diabetic eye exam and foot exam today.  Continues off of fenofibrate per her preference but is now on omega-3 fish oil supplement.  Is here fasting today. Vitamin D/vitamin B deficiency: Continues on supplementation of both.  Takes monthly B12 injection and daily vitamin D supplement.     Review of Systems  Respiratory:  Negative for shortness of breath.   Cardiovascular:  Negative for chest pain.  Neurological:  Negative for sensory change.      Objective:     BP 124/82   Pulse 85   Temp 98 F (36.7 C) (Temporal)   Ht 5\' 8"  (1.727 m)   Wt 210 lb (95.3 kg)   SpO2 96%   BMI 31.93 kg/m    Physical Exam Vitals reviewed.  Constitutional:      General: She is not in acute distress.    Appearance: Normal appearance.  HENT:     Head: Normocephalic and atraumatic.  Neck:     Vascular: No carotid bruit.  Cardiovascular:     Rate and Rhythm: Normal rate and regular rhythm.     Pulses: Normal pulses.     Heart sounds: Normal heart sounds.  Pulmonary:     Effort: Pulmonary effort is normal.     Breath sounds: Normal breath sounds.  Skin:    General: Skin is warm and dry.  Neurological:     General: No focal deficit present.     Mental Status: She is alert and oriented to person, place, and time.  Psychiatric:        Mood and Affect: Mood normal.        Behavior: Behavior normal.        Judgment: Judgment normal.    Diabetic Foot Exam - Simple   Simple Foot Form Diabetic Foot exam was performed with the following findings: Yes  05/09/2023  8:38 AM  Visual Inspection No deformities, no ulcerations, no other skin breakdown bilaterally: Yes Sensation Testing Intact to touch and monofilament testing bilaterally: Yes Pulse Check Posterior Tibialis and Dorsalis pulse intact bilaterally: Yes Comments       No results found for any visits on 05/09/23.    The ASCVD Risk score (Arnett DK, et al., 2019) failed to calculate for the following reasons:   The valid total cholesterol range is 130 to 320 mg/dL    Assessment & Plan:   Problem List Items Addressed This Visit       Cardiovascular and Mediastinum   Hypertension    Chronic, stable, controlled Patient to continue on lisinopril hydrochlorothiazide      Relevant Orders   Hemoglobin A1c   Comprehensive metabolic panel   Lipid panel   TSH   CBC   Vitamin B12   VITAMIN D 25 Hydroxy (Vit-D Deficiency, Fractures)   Microalbumin / creatinine urine ratio   Ambulatory referral to Ophthalmology     Endocrine   Hypothyroidism    Chronic, stable, controlled Continue on levothyroxine 75 mcg daily Check TSH, adjust levothyroxine accordingly      Relevant  Orders   Hemoglobin A1c   Comprehensive metabolic panel   Lipid panel   TSH   CBC   Vitamin B12   VITAMIN D 25 Hydroxy (Vit-D Deficiency, Fractures)   Microalbumin / creatinine urine ratio   Ambulatory referral to Ophthalmology   Type 2 diabetes mellitus with hyperglycemia, without long-term current use of insulin (HCC) - Primary    Chronic, last A1c above goal at 8.6 Goal is less than 7 Continue on Jardiance, metformin, Actos Check A1c today, further recommendations may be made based on these results Refer to ophthalmology for diabetic eye exam Foot exam completed today Up-to-date on immunizations except for COVID.  Recommend considering COVID-vaccine. Continue statin and ACEI      Relevant Orders   Hemoglobin A1c   Comprehensive metabolic panel   Lipid panel   TSH   CBC   Vitamin B12    VITAMIN D 25 Hydroxy (Vit-D Deficiency, Fractures)   Microalbumin / creatinine urine ratio   Ambulatory referral to Ophthalmology     Other   Hyperlipidemia    Chronic, stable Continue on omega 3 supplement and statin Check fasting lipid panel today, further recommendations may be made based upon the results      Relevant Orders   Hemoglobin A1c   Comprehensive metabolic panel   Lipid panel   TSH   CBC   Vitamin B12   VITAMIN D 25 Hydroxy (Vit-D Deficiency, Fractures)   Microalbumin / creatinine urine ratio   Ambulatory referral to Ophthalmology   Vitamin B 12 deficiency    Labs ordered, further recommendations may be made based upon these results       Relevant Orders   Hemoglobin A1c   Comprehensive metabolic panel   Lipid panel   TSH   CBC   Vitamin B12   VITAMIN D 25 Hydroxy (Vit-D Deficiency, Fractures)   Microalbumin / creatinine urine ratio   Ambulatory referral to Ophthalmology   Vitamin D deficiency    Labs ordered, further recommendations may be made based upon these results       Relevant Orders   Hemoglobin A1c   Comprehensive metabolic panel   Lipid panel   TSH   CBC   Vitamin B12   VITAMIN D 25 Hydroxy (Vit-D Deficiency, Fractures)   Microalbumin / creatinine urine ratio   Ambulatory referral to Ophthalmology    Return in about 8 months (around 01/06/2024) for CPE with Maralyn Sago.    Elenore Paddy, NP

## 2023-05-09 NOTE — Assessment & Plan Note (Signed)
Chronic, stable, controlled Continue on levothyroxine 75 mcg daily Check TSH, adjust levothyroxine accordingly

## 2023-05-09 NOTE — Assessment & Plan Note (Signed)
Chronic, stable, controlled Patient to continue on lisinopril hydrochlorothiazide

## 2023-05-16 ENCOUNTER — Telehealth: Payer: 59

## 2023-05-16 ENCOUNTER — Telehealth (INDEPENDENT_AMBULATORY_CARE_PROVIDER_SITE_OTHER): Payer: 59 | Admitting: Nurse Practitioner

## 2023-05-16 DIAGNOSIS — E1165 Type 2 diabetes mellitus with hyperglycemia: Secondary | ICD-10-CM | POA: Diagnosis not present

## 2023-05-16 DIAGNOSIS — Z7984 Long term (current) use of oral hypoglycemic drugs: Secondary | ICD-10-CM | POA: Diagnosis not present

## 2023-05-16 MED ORDER — RYBELSUS 3 MG PO TABS: 3.0000 mg | ORAL_TABLET | Freq: Every day | ORAL | 0 refills | Status: DC

## 2023-05-16 NOTE — Progress Notes (Signed)
   Established Patient Office Visit  An audio/visual tele-health visit was completed today for this patient. I connected with  [name] on 05/16/23 utilizing audio/visual technology and verified that I am speaking with the correct person using two identifiers. The patient was located at their home, and I was located at the office of [facility] during the encounter. I discussed the limitations of evaluation and management by telemedicine. The patient expressed understanding and agreed to proceed.     Subjective   Patient ID: [name], female    DOB: 15-Nov-1965  Age: 57 y.o. MRN: [id number]  Chief Complaint  Patient presents with   Diabetes    T2DM: Patient currently on Jardiance 25 mg daily, metformin 1000 mg twice a day, Actos 15mg  per day.  Last A1c came back above goal at 10.3.  Patient is also on Atorvastatin  and fish oil supplementation.   Patient continues on lisinopril-hydrochlorothiazide.  She arrives today to further discuss diabetes control.        Objective:     There were no vitals taken for this visit.   Physical Exam Comprehensive physical exam not completed today as office visit was conducted remotely.  Patient appears well over video, no acute distress noted.  Patient was alert and oriented, and appeared to have appropriate judgment.   No results found for any visits on 05/16/23.    The 10-year ASCVD risk score (Arnett DK, et al., 2019) is: 5.6%    Assessment & Plan:   Problem List Items Addressed This Visit       Endocrine   Type 2 diabetes mellitus with hyperglycemia, without long-term current use of insulin (HCC) - Primary    Chronic, A1c above goal Goal A1c less than 7 Per shared decision making we will add Rybelsus 3 mg by mouth daily x 3 days and plan to increase to 7 mg by mouth daily.  Discussed potential side effects including black box warnings and what to do if these were to  occur. Also continue on Jardiance, metformin, and Actos. Continue on atorvastatin and fish oil supplement. Continue on ACE inhibitor. Follow-up in 3 months for repeat A1c, or sooner as needed.      Relevant Medications   Semaglutide (RYBELSUS) 3 MG TABS    Return in about 3 months (around 08/16/2023) for F/U with Duvall Comes.    Elenore Paddy, NP

## 2023-05-16 NOTE — Assessment & Plan Note (Signed)
Chronic, A1c above goal Goal A1c less than 7 Per shared decision making we will add Rybelsus 3 mg by mouth daily x 3 days and plan to increase to 7 mg by mouth daily.  Discussed potential side effects including black box warnings and what to do if these were to occur. Also continue on Jardiance, metformin, and Actos. Continue on atorvastatin and fish oil supplement. Continue on ACE inhibitor. Follow-up in 3 months for repeat A1c, or sooner as needed.

## 2023-05-21 ENCOUNTER — Encounter: Payer: Self-pay | Admitting: Nurse Practitioner

## 2023-05-24 ENCOUNTER — Other Ambulatory Visit (HOSPITAL_COMMUNITY): Payer: Self-pay

## 2023-05-24 ENCOUNTER — Telehealth: Payer: Self-pay

## 2023-05-24 NOTE — Telephone Encounter (Signed)
Pharmacy Patient Advocate Encounter   Received notification from Patient Advice Request messages that prior authorization for Rybelsus 3mg  tabs is required/requested.   Insurance verification completed.   The patient is insured through [employer] [facility] .   Per test claim: PA required; PA submitted to [employer] [facility] via CoverMyMeds Key/confirmation #/EOC [facility] Status is pending

## 2023-05-27 NOTE — Telephone Encounter (Signed)
Pharmacy Patient Advocate Encounter  Received notification from [employer] [facility] that Prior Authorization for Rybelsus 3mg  has been DENIED.  Full denial letter will be uploaded to the media tab. See denial reason below.   PA #/Case ID/Reference #: [id number]

## 2023-05-30 ENCOUNTER — Other Ambulatory Visit: Payer: Self-pay | Admitting: Family

## 2023-05-30 MED ORDER — TRULICITY 0.75 MG/0.5ML ~~LOC~~ SOAJ: 0.7500 mg | SUBCUTANEOUS | 1 refills | Status: DC

## 2023-05-30 NOTE — Telephone Encounter (Signed)
Send mychart message to pt about denial medication from insurance

## 2023-06-01 ENCOUNTER — Other Ambulatory Visit: Payer: Self-pay | Admitting: Internal Medicine

## 2023-06-01 ENCOUNTER — Other Ambulatory Visit: Payer: Self-pay | Admitting: Nurse Practitioner

## 2023-06-01 DIAGNOSIS — I10 Essential (primary) hypertension: Secondary | ICD-10-CM

## 2023-06-01 DIAGNOSIS — F329 Major depressive disorder, single episode, unspecified: Secondary | ICD-10-CM

## 2023-06-03 ENCOUNTER — Other Ambulatory Visit: Payer: Self-pay

## 2023-06-05 ENCOUNTER — Other Ambulatory Visit: Payer: Self-pay | Admitting: Nurse Practitioner

## 2023-06-06 ENCOUNTER — Other Ambulatory Visit: Payer: Self-pay | Admitting: Nurse Practitioner

## 2023-06-06 DIAGNOSIS — E1165 Type 2 diabetes mellitus with hyperglycemia: Secondary | ICD-10-CM

## 2023-06-07 ENCOUNTER — Telehealth: Payer: Self-pay

## 2023-06-07 NOTE — Progress Notes (Signed)
   Care Guide Note  06/07/2023 Name: [name] MRN: [id number] DOB: 07-29-1966  Referred by: Elenore Paddy, NP Reason for referral : Care Coordination (Outreach to schedule with Pharm d )   [name] is a 57 y.o. year old female who is a primary care patient of Elenore Paddy, NP. [name] was referred to the pharmacist for assistance related to DM.    An unsuccessful telephone outreach was attempted today to contact the patient who was referred to the pharmacy team for assistance with medication assistance. Additional attempts will be made to contact the patient.   [name], RMA Care Guide [facility]  [place], [place] [address] Direct Dial: [phone] [email or web address]

## 2023-06-07 NOTE — Progress Notes (Signed)
   Care Guide Note  06/07/2023 Name: [name] MRN: [id number] DOB: [birth year]  Referred by: Elenore Paddy, NP Reason for referral : Care Coordination (Outreach to schedule with Pharm d )   [name] is a 57 y.o. year old female who is a primary care patient of Elenore Paddy, NP. [name] was referred to the pharmacist for assistance related to DM.    Successful contact was made with the patient to discuss pharmacy services including being ready for the pharmacist to call at least 5 minutes before the scheduled appointment time, to have medication bottles and any blood sugar or blood pressure readings ready for review. The patient agreed to meet with the pharmacist via with the pharmacist via telephone visit on (date/time).  06/11/2023  [name], RMA Care Guide [facility]  [place], [place] [address] Direct Dial: [phone] [name]

## 2023-06-10 ENCOUNTER — Other Ambulatory Visit: Payer: Self-pay | Admitting: Nurse Practitioner

## 2023-06-10 DIAGNOSIS — Z1231 Encounter for screening mammogram for malignant neoplasm of breast: Secondary | ICD-10-CM

## 2023-06-11 ENCOUNTER — Other Ambulatory Visit (HOSPITAL_BASED_OUTPATIENT_CLINIC_OR_DEPARTMENT_OTHER): Payer: Self-pay

## 2023-06-11 ENCOUNTER — Other Ambulatory Visit (INDEPENDENT_AMBULATORY_CARE_PROVIDER_SITE_OTHER): Payer: 59 | Admitting: Pharmacist

## 2023-06-11 DIAGNOSIS — E1165 Type 2 diabetes mellitus with hyperglycemia: Secondary | ICD-10-CM

## 2023-06-11 MED ORDER — TRULICITY 0.75 MG/0.5ML ~~LOC~~ SOAJ
0.7500 mg | SUBCUTANEOUS | 0 refills | Status: DC
  Filled 2023-06-11: qty 2, 28d supply, fill #0

## 2023-06-11 NOTE — Progress Notes (Signed)
   06/11/2023 Name: [name] MRN: [id number] DOB: 1966/01/18  Chief Complaint  Patient presents with   Diabetes   Medication Management    [name] is a 57 y.o. year old female who presented for a telephone visit.   They were referred to the pharmacist by their PCP for assistance in managing diabetes/medication access.   Subjective:  Care Team: Primary Care Provider: Elenore Paddy, NP ; Next Scheduled Visit: 08/16/2023  Medication Access/Adherence  Current Pharmacy:  [facility] [id number] - [address], [place] - [address] [facility] [address] [place] [address] Phone: [phone] Fax: [phone]    Patient reports affordability concerns with their medications: Yes  Patient reports access/transportation concerns to their pharmacy: No  Patient reports adherence concerns with their medications:  No    Pt notes she previously was able to use the Trulicity $25 savings card but tried to use it at [employer] and they were unable to get a discounted price.   Diabetes:  Current medications: Jardiance 25 mg daily, metformin 1000 mg twice daily, pioglitazone 15 mg daily Medications tried in the past: Trulicity 1.5 mg (stopped due to shortage issues)  Current glucose readings: has not checked recently  Objective:  Lab Results  Component Value Date   HGBA1C 10.3 (H) 05/09/2023    Lab Results  Component Value Date   CREATININE 0.78 05/09/2023   BUN 17 05/09/2023   NA 135 05/09/2023   K 4.1 05/09/2023   CL 101 05/09/2023   CO2 24 05/09/2023    Lab Results  Component Value Date   CHOL 147 05/09/2023   HDL 39.20 05/09/2023   LDLCALC 78 05/09/2023   TRIG 148.0 05/09/2023   CHOLHDL 4 05/09/2023    Medications Reviewed Today   Medications were not reviewed in this encounter       Assessment/Plan:   Diabetes: - Currently uncontrolled, goal A1c <7% - Will send Trulicity Rx to [facility] with savings  card information to see if we can get it at the reduced price.  - Recommend Trulicity 0.75 mg weekly x 4 weeks then increase to 1.5 mg. If A1c remains >7%, recommend increasing to 3 mg.  Trulicity Rx [employer] Info From Ranae Palms [id number] PCN [id number] Grp # [id number] ID [id number]   Follow Up Plan: 12/10 telephone f/u  Arbutus Leas, PharmD, BCPS Clinical Pharmacist [facility] [phone]

## 2023-06-12 ENCOUNTER — Encounter: Payer: Self-pay | Admitting: Nurse Practitioner

## 2023-06-12 ENCOUNTER — Encounter (HOSPITAL_BASED_OUTPATIENT_CLINIC_OR_DEPARTMENT_OTHER): Payer: Self-pay

## 2023-06-12 ENCOUNTER — Other Ambulatory Visit (HOSPITAL_BASED_OUTPATIENT_CLINIC_OR_DEPARTMENT_OTHER): Payer: Self-pay

## 2023-06-12 ENCOUNTER — Encounter: Payer: Self-pay | Admitting: Family

## 2023-06-17 ENCOUNTER — Other Ambulatory Visit (HOSPITAL_BASED_OUTPATIENT_CLINIC_OR_DEPARTMENT_OTHER): Payer: Self-pay

## 2023-06-17 ENCOUNTER — Encounter: Payer: Self-pay | Admitting: Nurse Practitioner

## 2023-06-20 ENCOUNTER — Other Ambulatory Visit (HOSPITAL_COMMUNITY): Payer: Self-pay

## 2023-06-27 ENCOUNTER — Telehealth (INDEPENDENT_AMBULATORY_CARE_PROVIDER_SITE_OTHER): Payer: 59 | Admitting: Nurse Practitioner

## 2023-06-27 DIAGNOSIS — E1165 Type 2 diabetes mellitus with hyperglycemia: Secondary | ICD-10-CM

## 2023-06-27 DIAGNOSIS — Z794 Long term (current) use of insulin: Secondary | ICD-10-CM | POA: Diagnosis not present

## 2023-06-27 DIAGNOSIS — Z7984 Long term (current) use of oral hypoglycemic drugs: Secondary | ICD-10-CM

## 2023-06-27 MED ORDER — INSULIN PEN NEEDLE 31G X 5 MM MISC: 1.0000 | Freq: Every day | 3 refills | Status: DC

## 2023-06-27 MED ORDER — INSULIN DEGLUDEC 100 UNIT/ML ~~LOC~~ SOPN: 10.0000 [IU] | PEN_INJECTOR | Freq: Every day | SUBCUTANEOUS | 6 refills | Status: DC

## 2023-06-27 NOTE — Progress Notes (Signed)
   Established Patient Office Visit  An audio/visual tele-health visit was completed today for this patient. I connected with  [name] on 06/27/23 utilizing audio/visual technology and verified that I am speaking with the correct person using two identifiers. The patient was located at their home, and I was located at the office of [facility] during the encounter. I discussed the limitations of evaluation and management by telemedicine. The patient expressed understanding and agreed to proceed.    Subjective   Patient ID: [name], female    DOB: November 09, 1965  Age: 57 y.o. MRN: [id number]  Chief Complaint  Patient presents with   Diabetes    Type 2 diabetes: Last A1c 10.3.  Currently on Jardiance 25 mg daily, metformin 1000 mg twice a day, and Actos 15 mg daily.  Tolerating medications well however A1c above goal.  Clinical pharmacist has been consulted with as I have attempted to prescribe patient Trulicity but her deductible or out-of-pocket cost is unaffordable for her.  Based on clinical pharmacist recommendation and discussion with patient it appears neck step would be long-acting insulin aimed at lowering A1c level.  Patient is here today for virtual visit to discuss further.    ROS: see HPI    Objective:     There were no vitals taken for this visit.   Physical Exam Comprehensive physical exam not completed today as office visit was conducted remotely.  Patient appears well over video.  Patient was alert and oriented, and appeared to have appropriate judgment.   No results found for any visits on 06/27/23.    The 10-year ASCVD risk score (Arnett DK, et al., 2019) is: 6.4%    Assessment & Plan:   Problem List Items Addressed This Visit       Endocrine   Type 2 diabetes mellitus with hyperglycemia, without long-term current use of insulin (HCC) - Primary    Chronic, A1c above goal, goal A1c less than  7 Continue Jardiance 25 mg daily, metformin 1000 mg twice a day, and Actos 15 mg daily.  Will start patient on long-acting insulin, it appears Evaristo Bury is on her formulary.  Recommend 10 units injected in the subcutaneous tissue once daily.  Patient educated on how to inject, as well as signs and symptoms of hypoglycemia.  We also discussed how to treat hypoglycemia if this were to occur, and that if this were to occur 2 or more times between now and the next time that I see her that she should reach out at which point we will lower the dose of [place].  She reports understanding.  She is also been referred to clinical pharmacy who will follow-up with her to further assist with any educational needs.      Relevant Medications   insulin degludec (TRESIBA) 100 UNIT/ML FlexTouch Pen   Insulin Pen Needle 31G X 5 MM MISC    Return for F/U with Maralyn Sago as scheduled in January, or sooner as needed.    Elenore Paddy, NP

## 2023-06-27 NOTE — Assessment & Plan Note (Signed)
Chronic, A1c above goal, goal A1c less than 7 Continue Jardiance 25 mg daily, metformin 1000 mg twice a day, and Actos 15 mg daily.  Will start patient on long-acting insulin, it appears [name] is on her formulary.  Recommend 10 units injected in the subcutaneous tissue once daily.  Patient educated on how to inject, as well as signs and symptoms of hypoglycemia.  We also discussed how to treat hypoglycemia if this were to occur, and that if this were to occur 2 or more times between now and the next time that I see her that she should reach out at which point we will lower the dose of [name].  She reports understanding.  She is also been referred to clinical pharmacy who will follow-up with her to further assist with any educational needs.

## 2023-06-28 ENCOUNTER — Other Ambulatory Visit (HOSPITAL_BASED_OUTPATIENT_CLINIC_OR_DEPARTMENT_OTHER): Payer: Self-pay

## 2023-07-02 ENCOUNTER — Encounter: Payer: Self-pay | Admitting: Nurse Practitioner

## 2023-07-03 ENCOUNTER — Encounter: Payer: Self-pay | Admitting: Nurse Practitioner

## 2023-07-03 ENCOUNTER — Other Ambulatory Visit: Payer: Self-pay

## 2023-07-03 MED ORDER — METFORMIN HCL 500 MG PO TABS: 1000.0000 mg | ORAL_TABLET | Freq: Two times a day (BID) | ORAL | 0 refills | Status: DC

## 2023-07-09 ENCOUNTER — Ambulatory Visit
Admission: RE | Admit: 2023-07-09 | Discharge: 2023-07-09 | Disposition: A | Discharge status: Home / Self Care | Payer: 59 | Source: Ambulatory Visit | Attending: Nurse Practitioner | Admitting: Nurse Practitioner

## 2023-07-09 DIAGNOSIS — Z1231 Encounter for screening mammogram for malignant neoplasm of breast: Secondary | ICD-10-CM

## 2023-07-15 ENCOUNTER — Other Ambulatory Visit: Payer: Self-pay | Admitting: Nurse Practitioner

## 2023-07-15 MED ORDER — METFORMIN HCL 500 MG PO TABS: 1000.0000 mg | ORAL_TABLET | Freq: Two times a day (BID) | ORAL | 0 refills | Status: DC

## 2023-07-23 ENCOUNTER — Other Ambulatory Visit: Payer: 59

## 2023-08-02 ENCOUNTER — Other Ambulatory Visit: Payer: Self-pay | Admitting: Nurse Practitioner

## 2023-08-16 ENCOUNTER — Ambulatory Visit: Payer: 59 | Admitting: Nurse Practitioner

## 2023-08-23 ENCOUNTER — Ambulatory Visit (INDEPENDENT_AMBULATORY_CARE_PROVIDER_SITE_OTHER): Payer: 59 | Admitting: Nurse Practitioner

## 2023-08-23 VITALS — BP 112/78 | HR 86 | Temp 98.0°F | Ht 68.0 in | Wt 217.1 lb

## 2023-08-23 DIAGNOSIS — Z7984 Long term (current) use of oral hypoglycemic drugs: Secondary | ICD-10-CM | POA: Diagnosis not present

## 2023-08-23 DIAGNOSIS — E538 Deficiency of other specified B group vitamins: Secondary | ICD-10-CM | POA: Diagnosis not present

## 2023-08-23 DIAGNOSIS — I10 Essential (primary) hypertension: Secondary | ICD-10-CM | POA: Diagnosis not present

## 2023-08-23 DIAGNOSIS — E1165 Type 2 diabetes mellitus with hyperglycemia: Secondary | ICD-10-CM

## 2023-08-23 LAB — POCT GLYCOSYLATED HEMOGLOBIN (HGB A1C): Hemoglobin A1C: 9.7 % — AB (ref 4.0–5.6)

## 2023-08-23 MED ORDER — INSULIN DEGLUDEC 100 UNIT/ML ~~LOC~~ SOPN: 12.0000 [IU] | PEN_INJECTOR | Freq: Every day | SUBCUTANEOUS | Status: DC

## 2023-08-23 MED ORDER — CYANOCOBALAMIN 1000 MCG/ML IJ SOLN: 1000.0000 ug | INTRAMUSCULAR | 0 refills | Status: DC

## 2023-08-23 NOTE — Assessment & Plan Note (Addendum)
 Chronic POC A1c: 9.7 Increase Tresiba  to 12u/day. Encouraged her to get a glucometer that is readily available to monitor daily blood sugars and to check levels to evaluate cause of any hypoglycemic symptoms. Continue Jardiance  5 mg daily, metformin  1000 mg twice a day, and Actos  50 mg daily. Continue lisinopril  and atorvastatin  Patient encouraged to follow-up with ophthalmologist for diabetic eye exam and to request records to be forwarded to this office.

## 2023-08-23 NOTE — Assessment & Plan Note (Signed)
 Chronic, stable, at goal Continue lisinopril/hydrochlorothiazide 10-12.5 mg/day

## 2023-08-23 NOTE — Progress Notes (Signed)
 Established Patient Office Visit  Subjective   Patient ID: [name], female    DOB: 30-Mar-1966  Age: 58 y.o. MRN: [id number]  Chief Complaint  Patient presents with   Diabetes   T2DM: Patient has a follow-up for diabetes.  Last A1c was 10.3 in September 2024.  About 6 to 8 weeks ago we started her on Tresiba  10 units by mouth once a day.  She also continues on Jardiance  25 mg daily metformin  1000 mg twice a day and Actos  15 mg daily.  Tolerating all medications well.  Reports that she is in middle of moving and packed with her glucometer, denies having had any hypoglycemic symptoms.  She is also on lisinopril  and atorvastatin .  Reports that she is aware she is overdue with her diabetic eye exam and plans on getting this up to date as soon as she settles at her new home.  Vitamin B12: She has vitamin B12 deficiency, serum level in September was 279 , continues to get monthly IM injections administered at her pharmacy.  Requesting refill of her vitamin B12 vial today.  HTN: Chronic, continues on lisinopril  hydrochlorothiazide  10-12.5 mg tablet daily.  Tolerating well.  She is also on Jardiance  25 mg daily.    Review of Systems  Eyes:  Negative for blurred vision.  Respiratory:  Negative for shortness of breath.   Cardiovascular:  Negative for chest pain.      Objective:     BP 112/78   Pulse 86   Temp 98 F (36.7 C) (Temporal)   Ht 5' 8 (1.727 m)   Wt 217 lb 2 oz (98.5 kg)   SpO2 100%   BMI 33.01 kg/m    Physical Exam Vitals reviewed.  Constitutional:      General: She is not in acute distress.    Appearance: Normal appearance.  HENT:     Head: Normocephalic and atraumatic.  Neck:     Vascular: No carotid bruit.  Cardiovascular:     Rate and Rhythm: Normal rate and regular rhythm.     Pulses: Normal pulses.     Heart sounds: Normal heart sounds.  Pulmonary:     Effort: Pulmonary effort is normal.     Breath sounds: Normal breath sounds.   Skin:    General: Skin is warm and dry.  Neurological:     General: No focal deficit present.     Mental Status: She is alert and oriented to person, place, and time.  Psychiatric:        Mood and Affect: Mood normal.        Behavior: Behavior normal.        Judgment: Judgment normal.      Results for orders placed or performed in visit on 08/23/23  POCT HgB A1C  Result Value Ref Range   Hemoglobin A1C 9.7 (A) 4.0 - 5.6 %   HbA1c POC (<> result, manual entry)     HbA1c, POC (prediabetic range)     HbA1c, POC (controlled diabetic range)        The 10-year ASCVD risk score (Arnett DK, et al., 2019) is: 5.3%    Assessment & Plan:   Problem List Items Addressed This Visit       Cardiovascular and Mediastinum   Hypertension   Chronic, stable, at goal Continue lisinopril /hydrochlorothiazide  10-12.5 mg/day        Endocrine   Type 2 diabetes mellitus with hyperglycemia, without long-term current use of insulin  (HCC) - Primary  Chronic POC A1c: 9.7 Increase Tresiba  to 12u/day. Encouraged her to get a glucometer that is readily available to monitor daily blood sugars and to check levels to evaluate cause of any hypoglycemic symptoms. Continue Jardiance  5 mg daily, metformin  1000 mg twice a day, and Actos  50 mg daily. Continue lisinopril  and atorvastatin  Patient encouraged to follow-up with ophthalmologist for diabetic eye exam and to request records to be forwarded to this office.      Relevant Medications   insulin  degludec (TRESIBA ) 100 UNIT/ML FlexTouch Pen   Other Relevant Orders   POCT HgB A1C (Completed)     Other   Vitamin B 12 deficiency   Chronic, stable Refill of B12 IM vial sent to pharmacy, consider rechecking serum B12 level at next lab draw.      Relevant Medications   cyanocobalamin  (VITAMIN B12) 1000 MCG/ML injection    Return in about 3 months (around 11/21/2023) for F/U with Lauraine.    Lauraine FORBES Pereyra, NP

## 2023-08-23 NOTE — Assessment & Plan Note (Signed)
 Chronic, stable Refill of B12 IM vial sent to pharmacy, consider rechecking serum B12 level at next lab draw.

## 2023-09-02 ENCOUNTER — Other Ambulatory Visit: Payer: Self-pay | Admitting: Nurse Practitioner

## 2023-09-05 ENCOUNTER — Other Ambulatory Visit: Payer: Self-pay | Admitting: Nurse Practitioner

## 2023-09-05 DIAGNOSIS — E538 Deficiency of other specified B group vitamins: Secondary | ICD-10-CM

## 2023-09-06 MED ORDER — CYANOCOBALAMIN 1000 MCG/ML IJ SOLN: 1000.0000 ug | INTRAMUSCULAR | 0 refills | Status: AC

## 2023-09-27 ENCOUNTER — Other Ambulatory Visit: Payer: Self-pay | Admitting: Internal Medicine

## 2023-09-27 ENCOUNTER — Other Ambulatory Visit: Payer: Self-pay

## 2023-10-26 ENCOUNTER — Other Ambulatory Visit: Payer: Self-pay | Admitting: Nurse Practitioner

## 2023-10-26 DIAGNOSIS — E034 Atrophy of thyroid (acquired): Secondary | ICD-10-CM

## 2023-11-06 ENCOUNTER — Encounter: Payer: Self-pay | Admitting: Nurse Practitioner

## 2023-11-06 LAB — HM DIABETES EYE EXAM

## 2023-11-21 ENCOUNTER — Ambulatory Visit: Payer: 59 | Admitting: Nurse Practitioner

## 2023-12-13 ENCOUNTER — Ambulatory Visit (INDEPENDENT_AMBULATORY_CARE_PROVIDER_SITE_OTHER): Admitting: Nurse Practitioner

## 2023-12-13 VITALS — BP 132/78 | HR 72 | Temp 97.6°F | Ht 68.0 in | Wt 215.4 lb

## 2023-12-13 DIAGNOSIS — I10 Essential (primary) hypertension: Secondary | ICD-10-CM | POA: Diagnosis not present

## 2023-12-13 DIAGNOSIS — E034 Atrophy of thyroid (acquired): Secondary | ICD-10-CM

## 2023-12-13 DIAGNOSIS — E1165 Type 2 diabetes mellitus with hyperglycemia: Secondary | ICD-10-CM

## 2023-12-13 LAB — POCT GLYCOSYLATED HEMOGLOBIN (HGB A1C): Hemoglobin A1C: 8.8 % — AB (ref 4.0–5.6)

## 2023-12-13 NOTE — Assessment & Plan Note (Signed)
 Chronic, stable Continue lisinopril -hydrochlorothiazide  10-12.5 mg tablet.  Take 1 tablet by mouth daily.

## 2023-12-13 NOTE — Assessment & Plan Note (Signed)
 Chronic TSH has been stable in multiple years on levothyroxine  at 75 mcg daily.  Clinically appears to be euthyroid. Will collect TSH level at next lab draw, in the meantime continue levothyroxine  75 mcg daily.

## 2023-12-13 NOTE — Assessment & Plan Note (Addendum)
 Chronic, uncontrolled Check A1c today, POC 8.8 this is improvement from last check at 9.7 Discussed adjusting medications to further improve A1C but patient declined today and feels she can make improvements with diet and exercise now that she is getting settled into her new home.  Offered CGM to patient but she declines Highly encouraged her to find glucometer and if she needs new prescription to let me know I have no problem sending in new prescription.  Would like to identify whether her blood sugars are elevated first thing in the morning or postprandial or both, however patient will need to start checking blood sugars for me to identify this.  She reports her understanding.  Patient encouraged to continue atorvastatin  and ACE inhibitor as well.  Plan to get updated metabolic panel and lipid panel at next lab draw.

## 2023-12-13 NOTE — Progress Notes (Signed)
 Established Patient Office Visit  Subjective   Patient ID: [name], female    DOB: 1966/04/20  Age: 58 y.o. MRN: [id number]  Chief Complaint  Patient presents with   Diabetes    T2DM: Chronic, last A1c 9.7.  Currently treated with Tresiba  12 units daily, Jardiance  25 mg daily, metformin  1000 mg twice daily, and Actos  15 mg daily.  Overall tolerating medications well, she does not check her blood sugars.  Recently moved and is not sure where her glucometer is but remembers packing it so she feels she could find it this weekend when she finishes unpacking.  Also on atorvastatin  10 mg daily (last LDL 78) and is on ACE inhibitor.  Up-to-date with eye exam, no retinopathy was noted.  Last test for albuminuria did not identify significant albuminuria.  Up-to-date on foot exam.  Hypothyroidism: Continues on levothyroxine  75 mcg daily, last TSH within normal limits.  Has been stable for multiple years on this dose.  Hypertension: On lisinopril -hydrochlorothiazide  10-12.5 mg daily.  Tolerating well.    Review of Systems  Respiratory:  Negative for cough and shortness of breath.   Cardiovascular:  Negative for chest pain and leg swelling.      Objective:     BP 132/78   Pulse 72   Temp 97.6 F (36.4 C) (Temporal)   Ht 5\' 8"  (1.727 m)   Wt 215 lb 6 oz (97.7 kg)   SpO2 99%   BMI 32.75 kg/m  BP Readings from Last 3 Encounters:  12/13/23 132/78  08/23/23 112/78  05/09/23 124/82   Wt Readings from Last 3 Encounters:  12/13/23 215 lb 6 oz (97.7 kg)  08/23/23 217 lb 2 oz (98.5 kg)  05/09/23 210 lb (95.3 kg)      Physical Exam Vitals reviewed.  Constitutional:      General: She is not in acute distress.    Appearance: Normal appearance.  HENT:     Head: Normocephalic and atraumatic.  Cardiovascular:     Rate and Rhythm: Normal rate and regular rhythm.     Pulses: Normal pulses.     Heart sounds: Normal heart sounds.  Pulmonary:     Effort: Pulmonary  effort is normal.     Breath sounds: Normal breath sounds.  Skin:    General: Skin is warm and dry.  Neurological:     General: No focal deficit present.     Mental Status: She is alert and oriented to person, place, and time.  Psychiatric:        Mood and Affect: Mood normal.        Behavior: Behavior normal.        Judgment: Judgment normal.      Results for orders placed or performed in visit on 12/13/23  POCT glycosylated hemoglobin (Hb A1C)  Result Value Ref Range   Hemoglobin A1C 8.8 (A) 4.0 - 5.6 %   HbA1c POC (<> result, manual entry)     HbA1c, POC (prediabetic range)     HbA1c, POC (controlled diabetic range)        The 10-year ASCVD risk score (Arnett DK, et al., 2019) is: 7.2%    Assessment & Plan:   Problem List Items Addressed This Visit       Cardiovascular and Mediastinum   Hypertension   Chronic, stable Continue lisinopril -hydrochlorothiazide  10-12.5 mg tablet.  Take 1 tablet by mouth daily.        Endocrine   Hypothyroidism   Chronic TSH has  been stable in multiple years on levothyroxine  at 75 mcg daily.  Clinically appears to be euthyroid. Will collect TSH level at next lab draw, in the meantime continue levothyroxine  75 mcg daily.      Type 2 diabetes mellitus with hyperglycemia, without long-term current use of insulin  (HCC) - Primary   Chronic, uncontrolled Check A1c today, POC 8.8 this is improvement from last check at 9.7 Discussed adjusting medications to further improve A1C but patient declined today and feels she can make improvements with diet and exercise now that she is getting settled into her new home.  Offered CGM to patient but she declines Highly encouraged her to find glucometer and if she needs new prescription to let me know I have no problem sending in new prescription.  Would like to identify whether her blood sugars are elevated first thing in the morning or postprandial or both, however patient will need to start checking  blood sugars for me to identify this.  She reports her understanding.  Patient encouraged to continue atorvastatin  and ACE inhibitor as well.  Plan to get updated metabolic panel and lipid panel at next lab draw.      Relevant Orders   POCT glycosylated hemoglobin (Hb A1C) (Completed)    Return in about 5 months (around 05/14/2024) for CPE with Isa Manuel.    Zorita Hiss, NP

## 2024-01-09 ENCOUNTER — Ambulatory Visit: Payer: 59 | Admitting: Nurse Practitioner

## 2024-01-12 ENCOUNTER — Other Ambulatory Visit: Payer: Self-pay | Admitting: Nurse Practitioner

## 2024-01-12 DIAGNOSIS — E1165 Type 2 diabetes mellitus with hyperglycemia: Secondary | ICD-10-CM

## 2024-01-25 ENCOUNTER — Other Ambulatory Visit: Payer: Self-pay | Admitting: Nurse Practitioner

## 2024-02-20 ENCOUNTER — Other Ambulatory Visit: Payer: Self-pay | Admitting: Nurse Practitioner

## 2024-04-30 ENCOUNTER — Encounter: Payer: Self-pay | Admitting: Nurse Practitioner

## 2024-05-04 ENCOUNTER — Other Ambulatory Visit: Payer: Self-pay

## 2024-05-04 DIAGNOSIS — E1165 Type 2 diabetes mellitus with hyperglycemia: Secondary | ICD-10-CM

## 2024-05-04 MED ORDER — TRESIBA FLEXTOUCH 100 UNIT/ML ~~LOC~~ SOPN: 10.0000 [IU] | PEN_INJECTOR | Freq: Every day | SUBCUTANEOUS | 3 refills | Status: DC

## 2024-05-21 ENCOUNTER — Encounter: Admitting: Nurse Practitioner

## 2024-05-28 ENCOUNTER — Other Ambulatory Visit: Payer: Self-pay | Admitting: Nurse Practitioner

## 2024-05-28 DIAGNOSIS — F329 Major depressive disorder, single episode, unspecified: Secondary | ICD-10-CM

## 2024-05-28 DIAGNOSIS — I10 Essential (primary) hypertension: Secondary | ICD-10-CM

## 2024-05-29 ENCOUNTER — Encounter: Payer: Self-pay | Admitting: Nurse Practitioner

## 2024-06-01 ENCOUNTER — Other Ambulatory Visit: Payer: Self-pay | Admitting: Nurse Practitioner

## 2024-06-01 DIAGNOSIS — Z1231 Encounter for screening mammogram for malignant neoplasm of breast: Secondary | ICD-10-CM

## 2024-06-02 ENCOUNTER — Other Ambulatory Visit: Payer: Self-pay

## 2024-06-02 DIAGNOSIS — E1165 Type 2 diabetes mellitus with hyperglycemia: Secondary | ICD-10-CM

## 2024-06-02 MED ORDER — EMPAGLIFLOZIN 25 MG PO TABS: 25.0000 mg | ORAL_TABLET | Freq: Every day | ORAL | 3 refills | Status: AC

## 2024-06-02 MED ORDER — TRESIBA FLEXTOUCH 100 UNIT/ML ~~LOC~~ SOPN: 10.0000 [IU] | PEN_INJECTOR | Freq: Every day | SUBCUTANEOUS | 3 refills | Status: DC

## 2024-06-19 ENCOUNTER — Ambulatory Visit (INDEPENDENT_AMBULATORY_CARE_PROVIDER_SITE_OTHER): Admitting: Nurse Practitioner

## 2024-06-19 ENCOUNTER — Other Ambulatory Visit: Payer: Self-pay | Admitting: Nurse Practitioner

## 2024-06-19 ENCOUNTER — Ambulatory Visit: Payer: Self-pay | Admitting: Nurse Practitioner

## 2024-06-19 VITALS — BP 124/86 | HR 62 | Temp 97.8°F | Ht 68.0 in | Wt 223.0 lb

## 2024-06-19 DIAGNOSIS — Z23 Encounter for immunization: Secondary | ICD-10-CM

## 2024-06-19 DIAGNOSIS — E034 Atrophy of thyroid (acquired): Secondary | ICD-10-CM

## 2024-06-19 DIAGNOSIS — E559 Vitamin D deficiency, unspecified: Secondary | ICD-10-CM | POA: Diagnosis not present

## 2024-06-19 DIAGNOSIS — E538 Deficiency of other specified B group vitamins: Secondary | ICD-10-CM

## 2024-06-19 DIAGNOSIS — E1165 Type 2 diabetes mellitus with hyperglycemia: Secondary | ICD-10-CM

## 2024-06-19 DIAGNOSIS — Z0001 Encounter for general adult medical examination with abnormal findings: Secondary | ICD-10-CM | POA: Diagnosis not present

## 2024-06-19 DIAGNOSIS — E785 Hyperlipidemia, unspecified: Secondary | ICD-10-CM

## 2024-06-19 DIAGNOSIS — Z7984 Long term (current) use of oral hypoglycemic drugs: Secondary | ICD-10-CM | POA: Diagnosis not present

## 2024-06-19 DIAGNOSIS — I10 Essential (primary) hypertension: Secondary | ICD-10-CM | POA: Diagnosis not present

## 2024-06-19 LAB — TSH: TSH: 2.71 u[IU]/mL (ref 0.35–5.50)

## 2024-06-19 LAB — CBC
HCT: 39.9 % (ref 36.0–46.0)
Hemoglobin: 13.1 g/dL (ref 12.0–15.0)
MCHC: 32.9 g/dL (ref 30.0–36.0)
MCV: 74.6 fl — ABNORMAL LOW (ref 78.0–100.0)
Platelets: 216 K/uL (ref 150.0–400.0)
RBC: 5.36 Mil/uL — ABNORMAL HIGH (ref 3.87–5.11)
RDW: 16.7 % — ABNORMAL HIGH (ref 11.5–15.5)
WBC: 6.4 K/uL (ref 4.0–10.5)

## 2024-06-19 LAB — LIPID PANEL
Cholesterol: 124 mg/dL (ref 0–200)
HDL: 34.8 mg/dL — ABNORMAL LOW (ref >39.00)
LDL Cholesterol: 60 mg/dL (ref 0–99)
NonHDL: 89.14
Total CHOL/HDL Ratio: 4
Triglycerides: 145 mg/dL (ref 0.0–149.0)
VLDL: 29 mg/dL (ref 0.0–40.0)

## 2024-06-19 LAB — COMPREHENSIVE METABOLIC PANEL WITH GFR
ALT: 22 U/L (ref 0–35)
AST: 18 U/L (ref 0–37)
Albumin: 4.6 g/dL (ref 3.5–5.2)
Alkaline Phosphatase: 63 U/L (ref 39–117)
BUN: 15 mg/dL (ref 6–23)
CO2: 25 meq/L (ref 19–32)
Calcium: 9.4 mg/dL (ref 8.4–10.5)
Chloride: 100 meq/L (ref 96–112)
Creatinine, Ser: 0.7 mg/dL (ref 0.40–1.20)
GFR: 94.76 mL/min (ref >60.00)
Glucose, Bld: 212 mg/dL — ABNORMAL HIGH (ref 70–99)
Potassium: 4 meq/L (ref 3.5–5.1)
Sodium: 137 meq/L (ref 135–145)
Total Bilirubin: 0.4 mg/dL (ref 0.2–1.2)
Total Protein: 7.5 g/dL (ref 6.0–8.3)

## 2024-06-19 LAB — MICROALBUMIN / CREATININE URINE RATIO
Creatinine,U: 29.6 mg/dL
Microalb Creat Ratio: UNDETERMINED mg/g (ref 0.0–30.0)
Microalb, Ur: <0.7 mg/dL

## 2024-06-19 LAB — HEMOGLOBIN A1C: Hgb A1c MFr Bld: 9.4 % — ABNORMAL HIGH (ref 4.6–6.5)

## 2024-06-19 LAB — VITAMIN B12: Vitamin B-12: 453 pg/mL (ref 211–911)

## 2024-06-19 LAB — VITAMIN D 25 HYDROXY (VIT D DEFICIENCY, FRACTURES): VITD: 44.16 ng/mL (ref 30.00–100.00)

## 2024-06-19 MED ORDER — TRESIBA FLEXTOUCH 100 UNIT/ML ~~LOC~~ SOPN: 15.0000 [IU] | PEN_INJECTOR | Freq: Every day | SUBCUTANEOUS | 3 refills | Status: AC

## 2024-06-19 NOTE — Assessment & Plan Note (Signed)
 Hypothyroidism Managed with levothyroxine . Current dose is 75 mcg daily. TSH level to be updated to assess current thyroid  function. - Ordered updated TSH level. - Will update levothyroxine  prescription pending TSH results.

## 2024-06-19 NOTE — Assessment & Plan Note (Signed)
 Hyperlipidemia Managed with atorvastatin  and omega-3 supplement. Last LDL was 78. Lipid panel to be updated to assess current status. - Ordered updated lipid panel. - Will adjust cholesterol medications pending lipid panel results.

## 2024-06-19 NOTE — Assessment & Plan Note (Signed)
 Essential hypertension Hypertension is well-controlled with current medication regimen. Blood pressure is at goal. - Continue Lisinopril -hydrochlorothiazide  12-12.5 mg daily.

## 2024-06-19 NOTE — Assessment & Plan Note (Signed)
 Vitamin D  deficiency Managed with daily supplementation of 2000 IU. Serum level to be updated to assess current status. - Ordered updated serum vitamin D  level.

## 2024-06-19 NOTE — Assessment & Plan Note (Signed)
 Vitamin B12 deficiency Managed with monthly injections. Serum level to be updated to assess current status. - Ordered updated serum B12 level.

## 2024-06-19 NOTE — Assessment & Plan Note (Signed)
 Type 2 diabetes mellitus with hyperglycemia Type 2 diabetes with hyperglycemia. Last A1c was 8.8, improved from 10.3 last year and 9.7 six months ago. Current treatment includes Jardiance , Tresiba , Metformin , and Actos . Blood sugar levels elevated, morning readings around 212. Goal is to reduce A1c below 7. Discussed potential increase in insulin  dosage if A1c remains above 7. Explained that high blood sugars can cause organ damage over time. - Ordered updated A1c. - Checked urine for albuminuria. - If A1c remains above 7, will increase Tresiba  to 15 units daily. - Foot exam completed today - Diabetic eye exam due 10/2024 - Scheduled follow-up in 6 months, may adjust to 3 months if A1c is significantly above 7.

## 2024-06-19 NOTE — Progress Notes (Signed)
 Complete physical exam  Patient: [name]   DOB: Jan 29, 1966   58 y.o. Female  MRN: [id number]  Subjective:    Chief Complaint  Patient presents with   Annual Exam    [name] is a 58 y.o. female who presents today for a complete physical exam.  Discussed the use of AI scribe software for clinical note transcription with the patient, who gave verbal consent to proceed.  History of Present Illness [name] is a 58 year old female who presents for an annual physical exam and to discuss her chronic medical conditions.  Hypertension - Managed with lisinopril -hydrochlorothiazide  12-12.5 mg daily - Blood pressure remains at goal  Type 2 diabetes mellitus - Managed with Jardiance  25 mg daily, Tresiba  12 units daily, metformin  1000 mg twice a day, and Actos  15 mg daily - Last hemoglobin A1c was 8.8, improved from previous levels - Morning blood glucose levels average 212 mg/dL per patient report  Hypothyroidism - Treated with levothyroxine  75 mcg daily  Hyperlipidemia - Managed with atorvastatin  10 mg daily and omega-3 supplement one capsule daily - Last LDL was 78  Vitamin deficiencies - Receives monthly vitamin B12 injections for deficiency - Takes 2000 IU of vitamin D  daily for low vitamin D   Preventive care - Requests influenza vaccination today    Most recent fall risk assessment:    06/19/2024    8:05 AM  Fall Risk   Falls in the past year? 0  Number falls in past yr: 0  Injury with Fall? 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   Pernicious anemia 07/02/2011   Thyroid  activity decreased    on meds   Vitamin B12 deficiency    Past Surgical History:  Procedure Laterality Date   BIOPSY  12/19/2021   Procedure: BIOPSY;  Surgeon: Legrand Victory LITTIE DOUGLAS, MD;  Location: [facility];  Service: Gastroenterology;;   COLONOSCOPY  2017   CG-MAC-Miralax(good)-TA   ESOPHAGOGASTRODUODENOSCOPY (EGD) WITH PROPOFOL  N/A 12/19/2021   Procedure: ESOPHAGOGASTRODUODENOSCOPY (EGD) WITH PROPOFOL ;  Surgeon: Legrand Victory LITTIE DOUGLAS, MD;  Location: [facility] ENDOSCOPY;  Service: Gastroenterology;  Laterality: N/A;   FOREIGN BODY REMOVAL  12/19/2021   Procedure: FOREIGN BODY REMOVAL;  Surgeon: Legrand Victory LITTIE DOUGLAS, MD;  Location: [facility] ENDOSCOPY;  Service: Gastroenterology;;   POLYPECTOMY  2017   TA   TOTAL ABDOMINAL HYSTERECTOMY W/ BILATERAL SALPINGOOPHORECTOMY  2009   Social History   Tobacco Use   Smoking status: Never   Smokeless tobacco: Never  Vaping Use   Vaping status: Never Used  Substance Use Topics   Alcohol use: No    Alcohol/week: 0.0 standard drinks of alcohol   Drug use: No   Family History  Adopted: Yes  Problem Relation Age of Onset   Colon cancer Neg Hx  Colon polyps Neg Hx    Esophageal cancer Neg Hx    Rectal cancer Neg Hx    Stomach cancer Neg Hx    No Known Allergies    Patient Care Team: Elnor Lauraine BRAVO, NP as PCP - General (Nurse Practitioner) Cleatus Collar, MD as Consulting Physician (Ophthalmology) Timmy Maude SAUNDERS, MD as Consulting Physician (Oncology) Jerrol Harvey, MD as Attending Physician (Obstetrics and Gynecology) Dodie Shadow, MD as Attending Physician (Gynecologic Oncology) Fleeta Zerita DASEN, MD as Consulting Physician (Ophthalmology) Merceda Lela SAUNDERS, [facility] (Pharmacist)   Outpatient Medications Prior to Visit  Medication Sig   atorvastatin  (LIPITOR) 10 MG tablet TAKE ONE TABLET BY MOUTH EVERY EVENING   Blood  Glucose Monitoring Suppl (ONE TOUCH ULTRA 2) w/Device KIT 1 each by Does not apply route daily. Use to check blood sugar daily   cyanocobalamin  (VITAMIN B12) 1000 MCG/ML injection Inject 1 mL (1,000 mcg total) into the muscle every 30 (thirty) days.   empagliflozin  (JARDIANCE ) 25 MG TABS tablet Take 1 tablet (25 mg total) by mouth daily before breakfast.   glucose blood (ONETOUCH ULTRA) test strip Use as instructed   insulin  degludec (TRESIBA  FLEXTOUCH) 100 UNIT/ML FlexTouch Pen Inject 10 Units into the skin daily.   Insulin  Pen Needle 31G X 5 MM MISC 1 each by Does not apply route daily.   levothyroxine  (SYNTHROID ) 75 MCG tablet TAKE 1 TABLET BY MOUTH DAILY   lisinopril -hydrochlorothiazide  (ZESTORETIC ) 10-12.5 MG tablet TAKE 1 TABLET BY MOUTH DAILY   metFORMIN  (GLUCOPHAGE ) 500 MG tablet Take 2 tablets (1,000 mg total) by mouth 2 (two) times daily with a meal.   Multiple Vitamin (MULTIVITAMIN) tablet Take 1 tablet by mouth daily.     omega-3 acid ethyl esters (LOVAZA) 1 g capsule Take by mouth daily.   OneTouch Delica Lancets 33G MISC 1 each by Does not apply route daily.   pioglitazone  (ACTOS ) 15 MG tablet TAKE 1 TABLET BY MOUTH DAILY AT 6 O'CLOCK IN THE MORNING   sertraline  (ZOLOFT ) 50 MG tablet TAKE 1 TABLET BY MOUTH DAILY   vitamin C (ASCORBIC ACID) 500 MG tablet Take 1,000 mg by mouth daily.    VITAMIN D  PO Take 2,000 Units by mouth daily. 2000   Facility-Administered Medications Prior to Visit  Medication Dose Route Frequency Provider   cyanocobalamin  (VITAMIN B12) injection 1,000 mcg  1,000 mcg Intramuscular Once Elnor Lauraine BRAVO, NP    Review of Systems  Constitutional:  Negative for fever.  Respiratory:  Negative for cough.   Cardiovascular:  Negative for chest pain and palpitations.  Gastrointestinal:  Negative for nausea.          Objective:     BP 124/86   Pulse 62   Temp 97.8 F (36.6 C) (Temporal)   Ht 5' 8 (1.727 m)   Wt 223 lb (101.2 kg)   SpO2 96%   BMI 33.91  kg/m  BP Readings from Last 3 Encounters:  06/19/24 124/86  12/13/23 132/78  08/23/23 112/78   Wt Readings from Last 3 Encounters:  06/19/24 223 lb (101.2 kg)  12/13/23 215 lb 6 oz (97.7 kg)  08/23/23 217 lb 2 oz (98.5 kg)      Physical Exam Vitals reviewed. Exam conducted with a chaperone present.  Constitutional:      Appearance: Normal appearance.  HENT:     Head: Normocephalic and atraumatic.     Right Ear: Tympanic membrane, ear canal and external ear normal.     Left Ear: Tympanic membrane, ear  canal and external ear normal.  Eyes:     General:        Right eye: No discharge.        Left eye: No discharge.     Extraocular Movements: Extraocular movements intact.     Conjunctiva/sclera: Conjunctivae normal.     Pupils: Pupils are equal, round, and reactive to light.  Neck:     Vascular: No carotid bruit.  Cardiovascular:     Rate and Rhythm: Normal rate and regular rhythm.     Pulses: Normal pulses.     Heart sounds: Normal heart sounds. No murmur heard. Pulmonary:     Effort: Pulmonary effort is normal.     Breath sounds: Normal breath sounds.  Chest:  Breasts:    Breasts are symmetrical.     Right: Normal.     Left: Normal.  Abdominal:     General: Abdomen is flat. Bowel sounds are normal. There is no distension.     Palpations: Abdomen is soft. There is no mass.     Tenderness: There is no abdominal tenderness.  Musculoskeletal:        General: No tenderness.     Cervical back: Neck supple. No muscular tenderness.     Right lower leg: No edema.     Left lower leg: No edema.  Lymphadenopathy:     Cervical: No cervical adenopathy.     Upper Body:     Right upper body: No supraclavicular adenopathy.     Left upper body: No supraclavicular adenopathy.  Skin:    General: Skin is warm and dry.  Neurological:     General: No focal deficit present.     Mental Status: She is alert and oriented to person, place, and time.     Motor: No weakness.     Gait:  Gait normal.  Psychiatric:        Mood and Affect: Mood normal.        Behavior: Behavior normal.        Judgment: Judgment normal.      Diabetic Foot Exam - Simple   Simple Foot Form Diabetic Foot exam was performed with the following findings: Yes 06/19/2024  8:10 AM  Visual Inspection No deformities, no ulcerations, no other skin breakdown bilaterally: Yes Sensation Testing Intact to touch and monofilament testing bilaterally: Yes Pulse Check Posterior Tibialis and Dorsalis pulse intact bilaterally: Yes Comments      No results found for any visits on 06/19/24.     Assessment & Plan:    Routine Health Maintenance and Physical Exam  Immunization History  Administered Date(s) Administered   Influenza Split 05/12/2012, 04/21/2013, 04/26/2014   Influenza, Quadrivalent, Recombinant, Inj, Pf 04/11/2023   Influenza, Seasonal, Injecte, Preservative Fre 04/23/2016, 06/19/2024   Influenza,inj,Quad PF,6+ Mos 06/28/2015, 04/08/2017, 04/07/2018, 04/08/2019, 04/21/2021   Influenza,inj,Quad PF,6-35 Mos 04/08/2019   Influenza-Unspecified 04/23/2016, 04/08/2017, 05/02/2022   PFIZER(Purple Top)SARS-COV-2 Vaccination 10/29/2019, 11/23/2019   PNEUMOCOCCAL CONJUGATE-20 05/12/2021   PPD Test 04/22/2023   Pneumococcal Polysaccharide-23 05/03/2008   Tdap 11/21/2009, 05/11/2020   Zoster Recombinant(Shingrix) 02/05/2017, 04/08/2017    Health Maintenance  Topic Date Due   Diabetic kidney evaluation - Urine ACR  Never done   Hepatitis B Vaccines 19-59 Average Risk (1 of 3 - 19+ 3-dose series) Never done   Diabetic kidney evaluation - eGFR measurement  05/08/2024   FOOT EXAM  05/08/2024   HEMOGLOBIN A1C  06/14/2024   Mammogram  07/08/2024   OPHTHALMOLOGY EXAM  11/05/2024  DTaP/Tdap/Td (3 - Td or Tdap) 05/11/2030   Colonoscopy  10/21/2031   Pneumococcal Vaccine: 50+ Years  Completed   Influenza Vaccine  Completed   Hepatitis C Screening  Completed   HIV Screening  Completed   Zoster  Vaccines- Shingrix  Completed   HPV VACCINES  Aged Out   Meningococcal B Vaccine  Aged Out   COVID-19 Vaccine  Discontinued    Discussed health benefits of physical activity, and encouraged her to engage in regular exercise appropriate for her age and condition.  Problem List Items Addressed This Visit       Cardiovascular and Mediastinum   Hypertension   Relevant Orders   CBC   Comprehensive metabolic panel with GFR   Hemoglobin A1c   Lipid panel   TSH   Microalbumin / creatinine urine ratio   Vitamin B12   VITAMIN D  25 Hydroxy (Vit-D Deficiency, Fractures)     Endocrine   Hypothyroidism   Relevant Orders   CBC   Comprehensive metabolic panel with GFR   Hemoglobin A1c   Lipid panel   TSH   Microalbumin / creatinine urine ratio   Vitamin B12   VITAMIN D  25 Hydroxy (Vit-D Deficiency, Fractures)   Type 2 diabetes mellitus with hyperglycemia, without long-term current use of insulin  (HCC) - Primary   Relevant Orders   CBC   Comprehensive metabolic panel with GFR   Hemoglobin A1c   Lipid panel   TSH   Microalbumin / creatinine urine ratio   Vitamin B12   VITAMIN D  25 Hydroxy (Vit-D Deficiency, Fractures)     Other   Abnormally low high density lipoprotein (HDL) cholesterol with hypertriglyceridemia   Relevant Orders   CBC   Comprehensive metabolic panel with GFR   Hemoglobin A1c   Lipid panel   TSH   Microalbumin / creatinine urine ratio   Vitamin B12   VITAMIN D  25 Hydroxy (Vit-D Deficiency, Fractures)   Hyperlipidemia   Relevant Orders   CBC   Comprehensive metabolic panel with GFR   Hemoglobin A1c   Lipid panel   TSH   Microalbumin / creatinine urine ratio   Vitamin B12   VITAMIN D  25 Hydroxy (Vit-D Deficiency, Fractures)   Vitamin B 12 deficiency   Relevant Orders   CBC   Comprehensive metabolic panel with GFR   Hemoglobin A1c   Lipid panel   TSH   Microalbumin / creatinine urine ratio   Vitamin B12   VITAMIN D  25 Hydroxy (Vit-D Deficiency,  Fractures)   Encounter for general adult medical examination with abnormal findings   Relevant Orders   CBC   Comprehensive metabolic panel with GFR   Hemoglobin A1c   Lipid panel   TSH   Microalbumin / creatinine urine ratio   Vitamin B12   VITAMIN D  25 Hydroxy (Vit-D Deficiency, Fractures)   Vitamin D  deficiency   Relevant Orders   CBC   Comprehensive metabolic panel with GFR   Hemoglobin A1c   Lipid panel   TSH   Microalbumin / creatinine urine ratio   Vitamin B12   VITAMIN D  25 Hydroxy (Vit-D Deficiency, Fractures)   Other Visit Diagnoses       Essential hypertension       Relevant Orders   CBC   Comprehensive metabolic panel with GFR   Hemoglobin A1c   Lipid panel   TSH   Microalbumin / creatinine urine ratio   Vitamin B12   VITAMIN D  25 Hydroxy (Vit-D Deficiency, Fractures)  Need for vaccination       Relevant Orders   Flu vaccine trivalent PF, 6mos and older(Flulaval,Afluria,Fluarix,Fluzone) (Completed)      Assessment and Plan Assessment & Plan General adult medical examination with abnormal findings Routine annual physical examination conducted. Mammogram scheduled for next month. Colon cancer screening up to date until 2033. Tetanus vaccine up to date until 2031. Hepatitis C screening and pneumonia vaccines completed. - Administered flu shot. - Ordered updated labs. - Scheduled mammogram for next month. - Health maintenance handout provided  Type 2 diabetes mellitus with hyperglycemia Type 2 diabetes with hyperglycemia. Last A1c was 8.8, improved from 10.3 last year and 9.7 six months ago. Current treatment includes Jardiance , Tresiba , Metformin , and Actos . Blood sugar levels elevated, morning readings around 212. Goal is to reduce A1c below 7. Discussed potential increase in insulin  dosage if A1c remains above 7. Explained that high blood sugars can cause organ damage over time. - Ordered updated A1c. - Checked urine for albuminuria. - If A1c remains  above 7, will increase Tresiba  to 15 units daily. - Foot exam completed today - Diabetic eye exam due 10/2024 - Scheduled follow-up in 6 months, may adjust to 3 months if A1c is significantly above 7.  Essential hypertension Hypertension is well-controlled with current medication regimen. Blood pressure is at goal. - Continue Lisinopril -hydrochlorothiazide  12-12.5 mg daily.  Hypothyroidism Managed with levothyroxine . Current dose is 75 mcg daily. TSH level to be updated to assess current thyroid  function. - Ordered updated TSH level. - Will update levothyroxine  prescription pending TSH results.  Hyperlipidemia Managed with atorvastatin  and omega-3 supplement. Last LDL was 78. Lipid panel to be updated to assess current status. - Ordered updated lipid panel. - Will adjust cholesterol medications pending lipid panel results.  Vitamin B12 deficiency Managed with monthly injections. Serum level to be updated to assess current status. - Ordered updated serum B12 level.  Vitamin D  deficiency Managed with daily supplementation of 2000 IU. Serum level to be updated to assess current status. - Ordered updated serum vitamin D  level.  In addition to annual physical exam I also completed an office visit as detailed above  Return in about 6 months (around 12/17/2024) for F/U with Lauraine.     Lauraine FORBES Pereyra, NP

## 2024-06-19 NOTE — Assessment & Plan Note (Signed)
 General adult medical examination with abnormal findings Routine annual physical examination conducted. Mammogram scheduled for next month. Colon cancer screening up to date until 2033. Tetanus vaccine up to date until 2031. Hepatitis C screening and pneumonia vaccines completed. - Administered flu shot. - Ordered updated labs. - Scheduled mammogram for next month. - Health maintenance handout provided

## 2024-07-13 ENCOUNTER — Ambulatory Visit
Admission: RE | Admit: 2024-07-13 | Discharge: 2024-07-13 | Disposition: A | Source: Ambulatory Visit | Attending: Nurse Practitioner | Admitting: Nurse Practitioner

## 2024-07-13 DIAGNOSIS — Z1231 Encounter for screening mammogram for malignant neoplasm of breast: Secondary | ICD-10-CM

## 2024-07-17 ENCOUNTER — Ambulatory Visit: Payer: Self-pay | Admitting: Nurse Practitioner

## 2024-07-18 ENCOUNTER — Other Ambulatory Visit: Payer: Self-pay | Admitting: Nurse Practitioner

## 2024-07-18 DIAGNOSIS — E1165 Type 2 diabetes mellitus with hyperglycemia: Secondary | ICD-10-CM

## 2024-07-20 ENCOUNTER — Other Ambulatory Visit: Payer: Self-pay | Admitting: Nurse Practitioner

## 2024-08-13 ENCOUNTER — Encounter: Payer: Self-pay | Admitting: Family

## 2024-08-22 ENCOUNTER — Encounter: Payer: Self-pay | Admitting: Nurse Practitioner

## 2024-08-24 ENCOUNTER — Other Ambulatory Visit: Payer: Self-pay

## 2024-08-24 MED ORDER — PIOGLITAZONE HCL 15 MG PO TABS: 15.0000 mg | ORAL_TABLET | Freq: Every day | ORAL | 1 refills | Status: AC

## 2024-09-09 ENCOUNTER — Encounter: Payer: Self-pay | Admitting: Family

## 2024-09-09 ENCOUNTER — Other Ambulatory Visit (HOSPITAL_COMMUNITY): Payer: Self-pay

## 2024-12-18 ENCOUNTER — Ambulatory Visit: Admitting: Nurse Practitioner
